# Patient Record
Sex: Male | Born: 1950
Health system: Southern US, Community
[De-identification: ages and names within clinical notes are randomized; demographics above are authoritative.]

## PROBLEM LIST (undated history)

## (undated) DIAGNOSIS — E7800 Pure hypercholesterolemia, unspecified: Secondary | ICD-10-CM

## (undated) DIAGNOSIS — E785 Hyperlipidemia, unspecified: Secondary | ICD-10-CM

## (undated) DIAGNOSIS — Z9109 Other allergy status, other than to drugs and biological substances: Secondary | ICD-10-CM

## (undated) DIAGNOSIS — E6609 Other obesity due to excess calories: Secondary | ICD-10-CM

## (undated) DIAGNOSIS — R7303 Prediabetes: Secondary | ICD-10-CM

## (undated) DIAGNOSIS — R202 Paresthesia of skin: Secondary | ICD-10-CM

## (undated) DIAGNOSIS — E782 Mixed hyperlipidemia: Secondary | ICD-10-CM

## (undated) DIAGNOSIS — M1990 Unspecified osteoarthritis, unspecified site: Secondary | ICD-10-CM

## (undated) DIAGNOSIS — I10 Essential (primary) hypertension: Secondary | ICD-10-CM

## (undated) DIAGNOSIS — Z6832 Body mass index (BMI) 32.0-32.9, adult: Secondary | ICD-10-CM

## (undated) DIAGNOSIS — R200 Anesthesia of skin: Secondary | ICD-10-CM

## (undated) HISTORY — DX: Unspecified osteoarthritis, unspecified site: M19.90

## (undated) HISTORY — DX: Hyperlipidemia, unspecified: E78.5

## (undated) HISTORY — DX: Essential (primary) hypertension: I10

## (undated) HISTORY — PX: OTHER SURGICAL HISTORY: SHX169

## (undated) HISTORY — DX: Mixed hyperlipidemia: E78.2

## (undated) HISTORY — DX: Other allergy status, other than to drugs and biological substances: Z91.09

## (undated) HISTORY — DX: Pure hypercholesterolemia, unspecified: E78.00

## (undated) HISTORY — DX: Other obesity due to excess calories: E66.09

## (undated) HISTORY — DX: Prediabetes: R73.03

## (undated) HISTORY — PX: COLONOSCOPY: SHX174

## (undated) HISTORY — DX: Paresthesia of skin: R20.2

## (undated) HISTORY — DX: Anesthesia of skin: R20.0

## (undated) HISTORY — DX: Body mass index (bmi) 32.0-32.9, adult: Z68.32

---

## 2005-05-09 ENCOUNTER — Emergency Department (HOSPITAL_COMMUNITY): Admission: EM | Admit: 2005-05-09 | Discharge: 2005-05-09 | Payer: Self-pay | Admitting: Emergency Medicine

## 2005-10-05 ENCOUNTER — Inpatient Hospital Stay (HOSPITAL_BASED_OUTPATIENT_CLINIC_OR_DEPARTMENT_OTHER): Admission: RE | Admit: 2005-10-05 | Discharge: 2005-10-05 | Payer: Self-pay | Admitting: Cardiology

## 2006-08-21 ENCOUNTER — Emergency Department (HOSPITAL_COMMUNITY): Admission: EM | Admit: 2006-08-21 | Discharge: 2006-08-21 | Payer: Self-pay | Admitting: Emergency Medicine

## 2007-08-21 ENCOUNTER — Encounter: Payer: Self-pay | Admitting: Cardiovascular Disease

## 2008-04-06 ENCOUNTER — Ambulatory Visit: Payer: Self-pay | Admitting: Internal Medicine

## 2008-04-06 LAB — CONVERTED CEMR LAB
BUN: 16 mg/dL (ref 6–23)
Creatinine, Ser: 1.2 mg/dL (ref 0.4–1.2)

## 2008-04-10 ENCOUNTER — Ambulatory Visit: Payer: Self-pay | Admitting: Internal Medicine

## 2008-04-22 ENCOUNTER — Ambulatory Visit: Payer: Self-pay | Admitting: Internal Medicine

## 2010-04-11 ENCOUNTER — Encounter: Payer: Self-pay | Admitting: Cardiovascular Disease

## 2010-12-29 ENCOUNTER — Emergency Department (HOSPITAL_COMMUNITY)
Admission: EM | Admit: 2010-12-29 | Discharge: 2010-12-29 | Payer: Self-pay | Source: Home / Self Care | Admitting: Emergency Medicine

## 2011-01-02 LAB — URINALYSIS, ROUTINE W REFLEX MICROSCOPIC
Bilirubin Urine: NEGATIVE
Hgb urine dipstick: NEGATIVE
Ketones, ur: NEGATIVE mg/dL
Nitrite: NEGATIVE
Protein, ur: NEGATIVE mg/dL
Specific Gravity, Urine: 1.014 (ref 1.005–1.030)
Urine Glucose, Fasting: NEGATIVE mg/dL
Urobilinogen, UA: 1 mg/dL (ref 0.0–1.0)
pH: 6.5 (ref 5.0–8.0)

## 2011-01-02 LAB — POCT I-STAT, CHEM 8
BUN: 13 mg/dL (ref 6–23)
Calcium, Ion: 1.14 mmol/L (ref 1.12–1.32)
Chloride: 100 mEq/L (ref 96–112)
Creatinine, Ser: 1.1 mg/dL (ref 0.4–1.5)
Glucose, Bld: 106 mg/dL — ABNORMAL HIGH (ref 70–99)
HCT: 45 % (ref 39.0–52.0)
Hemoglobin: 15.3 g/dL (ref 13.0–17.0)
Potassium: 3.2 mEq/L — ABNORMAL LOW (ref 3.5–5.1)
Sodium: 140 mEq/L (ref 135–145)
TCO2: 31 mmol/L (ref 0–100)

## 2011-03-08 ENCOUNTER — Encounter: Payer: Self-pay | Admitting: Internal Medicine

## 2011-03-16 NOTE — Letter (Signed)
Summary: Colonoscopy Letter  [facility]  [address] [place], [place] [address]   Phone: [phone]  Fax: [phone]      March 08, 2011 MRN: [id number]   [facility] [address] [place], [place]  [address]   Dear Ms. Souter,   According to your medical record, it is time for you to schedule a Colonoscopy. The American Cancer Society recommends this procedure as a method to detect early colon cancer. Patients with a family history of colon cancer, or a personal history of colon polyps or inflammatory bowel disease are at increased risk.  This letter has been generated based on the recommendations made at the time of your procedure. If you feel that in your particular situation this may no longer apply, please contact our office.  Please call our office at [phone] to schedule this appointment or to update your records at your earliest convenience.  Thank you for cooperating with [place] to provide you with the very best care possible.   Sincerely,  Wilhemina Bonito. Marina Goodell, M.D.  [facility] [phone]

## 2011-05-02 NOTE — Assessment & Plan Note (Signed)
[facility]                         GASTROENTEROLOGY OFFICE NOTE   NAME:Bryce Rodriguez, Bryce Rodriguez                       MRN:          [id number]  DATE:04/06/2008                            DOB:          04-02-1951    REFERRING PHYSICIAN:  Brett Canales A. Cleta Alberts, M.D.   REASON FOR CONSULTATION:  Rectal bleeding and right flank pain.   HISTORY:  This is a pleasant 60 year old African-American male who is  sent through the courtesy of Dr. Cleta Alberts regarding rectal bleeding and  right-sided pain.  The patient was seen in this office in March of 2002  for Hemoccult positive stool.  At this time, he underwent colonoscopy  and upper endoscopy.  Colonoscopy was normal except for internal  hemorrhoids.  The ileum was normal.  Upper endoscopy revealed antral and  duodenal erosions.  Testing for Helicobacter pylori was positive.  He  was treated with Prevpac times two weeks.  He has not been seen since.  The patient's current history dates back to November or December when he  developed problems with right flank/pain.  The discomfort occurs daily  and is worse at night when he is lying down.  There is some radiation  into the buttocks.  Discomfort is not affected by meals or other  activity.  He had a CT urogram which was negative.  He saw an orthopedic  surgeon who suggested tightness in the musculature.  He denies change in  bowel habits or urinary bladder problems.  His appetite and weight had  been stable, some gas and bloating, rare constipation though this is not  new.  Of importance, the patient noticed blood in his stool, two  episodes about one month ago.  No associated rectal pain.   PAST MEDICAL HISTORY:  1. Hypertension.  2. Hyperlipidemia.   PAST SURGICAL HISTORY:  None.   ALLERGIES:  NO KNOWN DRUG ALLERGIES   CURRENT MEDICATIONS:  1. Crestor 20 mg daily.  2. Pantoprazole 40 mg daily.  3. Benicar HCT 40/25 mg daily.  4. Aspirin 81 mg daily.   FAMILY HISTORY:   Father with gastric cancer.  Mother with breast cancer.   SOCIAL HISTORY:  The patient is married with three children.  He  completed high school.  Works as a Merchandiser, retail at [employer].  Does not smoke.  Does use alcohol.   REVIEW OF SYSTEMS:  Per diagnostic evaluation form.   PHYSICAL EXAMINATION:  GENERAL:  Well-appearing male in no acute  distress.  VITAL SIGNS:  Blood pressure is 154/98, heart rate is 68 and regular,  respirations are 18 and regular, weight 197.4 pounds.  He is 5 feet, 5  inches in height.  HEENT:  Sclerae anicteric, conjunctivae pink.  Oral mucosa is intact.  NECK:  Thyroid is normal.  No adenopathy.  LUNGS:  Clear to auscultation and percussion.  HEART:  Regular without murmur.  ABDOMEN:  Soft, nontender, nondistended with good bowel sounds, no  organomegaly, masses or hernia.  SPINE:  Reveals no tenderness to percussion.  MUSCULOSKELETAL:  As well, no tenderness to percussion over the flank  regions.  Range  of motion appears normal in the lower extremities.  NEUROLOGIC:  No focal deficits appreciated.  EXTREMITIES:  Without edema.  RECTAL EXAM:  Deferred.   IMPRESSION:  1. Chronic right flank/hip pain.  Likely musculoskeletal.  Unlikely      gastrointestinal.  2. Two episodes of rectal bleeding possibly due to hemorrhoids, rule      out neoplasia.  3. History of erosive gastroduodenitis with Helicobacter pylori.      Prior treatment with Prevpac.No active dyspeptic symptoms.   RECOMMENDATIONS:  1. Schedule colonoscopy to evaluate rectal bleeding.  The nature of      the procedure as well as the risks, benefits and alternatives have      been reviewed.  He understood and agreed to proceed.  2. Schedule contrast-enhanced CT scan of the abdomen and pelvis to      further evaluate pain.  If this is negative, then return to the      care of Dr. Cleta Alberts regarding further evaluation and management of      pain.     Wilhemina Bonito. Marina Goodell, MD  Electronically  Signed    JNP/MedQ  DD: 04/06/2008  DT: 04/06/2008  Job #: [id number]   cc:   Brett Canales A. Cleta Alberts, M.D.

## 2011-05-05 NOTE — Cardiovascular Report (Signed)
NAMEBOWDEN, Bryce Rodriguez                ACCOUNT NO.:  [id number]   MEDICAL RECORD NO.:  [id number]          PATIENT TYPE:  OIB   LOCATION:  1961                         FACILITY:  [facility]   PHYSICIAN:  Georga Hacking, M.D.DATE OF BIRTH:  [birth year]   DATE OF PROCEDURE:  10/05/2005  DATE OF DISCHARGE:                              CARDIAC CATHETERIZATION   HISTORY:  [age]-year-old black male with hypertension and some chest  discomfort with atypical features.  A Cardiolite stress test showed the  presence of possible lateral wall ischemia.   PROCEDURE:  Left heart catheterization with coronary angiograms and left  ventriculogram.   COMMENTS ABOUT PROCEDURE:  The procedure was done with 4-French catheters  using a single anterior wall needle stick through a 4-French sheath.  The  patient tolerated the procedure well without complications and had good  hemostasis and peripheral pulses noted at the end of the procedure.   HEMODYNAMIC DATA:  Aorta post contrast 148/87.  LV postcontrast 148/18-26.   ANGIOGRAPHIC DATA:   LEFT VENTRICULOGRAM:  Performed in the 30-degree RAO projection.  The aortic  valve is normal.  The mitral valve appears normal.  The left ventricle  appears normal in size.  Increased trabeculations consistent with LVH are  noted.  Estimated ejection fraction is 60%.  Wall motion is normal.   Coronary arteries:  Arise and distribute normally.  No significant coronary  calcification is present.  Left main coronary artery normal.   Left anterior descending:  Large vessel extending to the apex, which is free  of disease.  There are scattered irregularities present.   Circumflex coronary artery:  There are 3 marginal arteries noted; they  contains scattered irregularities.  The third marginal artery is somewhat  small in caliber.   Right coronary artery:  A large dominant vessel containing scattered  irregularities.  The posterior descending and posterolateral branch  are  noted.   IMPRESSION:  1.  Scattered irregularities, but no significant coronary artery disease      noted.  2.  Normal left ventricular function with left ventricular hypertrophy and      increased left ventricular end-diastolic pressure.   RECOMMENDATIONS:  Weight loss, good control of blood pressure, control of  lipids.      Georga Hacking, M.D.  Electronically Signed     WST/MEDQ  D:  10/05/2005  T:  10/05/2005  Job:  [id number]   cc:   Brett Canales A. Cleta Alberts, M.D.  Fax: [phone]

## 2011-11-22 ENCOUNTER — Ambulatory Visit: Payer: Managed Care, Other (non HMO)

## 2011-11-22 DIAGNOSIS — R071 Chest pain on breathing: Secondary | ICD-10-CM

## 2011-11-22 DIAGNOSIS — J069 Acute upper respiratory infection, unspecified: Secondary | ICD-10-CM

## 2011-11-22 DIAGNOSIS — J209 Acute bronchitis, unspecified: Secondary | ICD-10-CM

## 2012-01-23 ENCOUNTER — Encounter: Payer: Self-pay | Admitting: Physician Assistant

## 2012-01-23 ENCOUNTER — Ambulatory Visit (INDEPENDENT_AMBULATORY_CARE_PROVIDER_SITE_OTHER): Payer: Managed Care, Other (non HMO) | Admitting: Physician Assistant

## 2012-01-23 DIAGNOSIS — E7800 Pure hypercholesterolemia, unspecified: Secondary | ICD-10-CM | POA: Insufficient documentation

## 2012-01-23 DIAGNOSIS — J069 Acute upper respiratory infection, unspecified: Secondary | ICD-10-CM

## 2012-01-23 DIAGNOSIS — H6092 Unspecified otitis externa, left ear: Secondary | ICD-10-CM

## 2012-01-23 DIAGNOSIS — I10 Essential (primary) hypertension: Secondary | ICD-10-CM | POA: Insufficient documentation

## 2012-01-23 DIAGNOSIS — H60399 Other infective otitis externa, unspecified ear: Secondary | ICD-10-CM

## 2012-01-23 HISTORY — DX: Pure hypercholesterolemia, unspecified: E78.00

## 2012-01-23 MED ORDER — OFLOXACIN 0.3 % OT SOLN: 10.0000 [drp] | Freq: Every day | OTIC | Status: AC

## 2012-01-23 MED ORDER — GUAIFENESIN ER 1200 MG PO TB12: 1.0000 | ORAL_TABLET | Freq: Two times a day (BID) | ORAL | Status: DC

## 2012-01-23 NOTE — Progress Notes (Signed)
  Subjective:    Patient ID: Bryce Rodriguez, male    DOB: Mar 27, 1951, 61 y.o.   MRN: [id number]  Otalgia  There is pain in the left ear. This is a new problem. The current episode started today. The problem occurs constantly. The problem has been unchanged. There has been no fever. Associated symptoms include ear discharge (? Color) and hearing loss (muffled). Pertinent negatives include no coughing, diarrhea, headaches, rhinorrhea, sore throat or vomiting. He has tried nothing for the symptoms.  URI  This is a new problem. The current episode started yesterday. The problem has been unchanged. There has been no fever. Associated symptoms include congestion (alot of sinus pressure), ear pain (L only) and a plugged ear sensation (muffled hearing). Pertinent negatives include no coughing, diarrhea, headaches, nausea, rhinorrhea, sore throat or vomiting. He has tried nothing for the symptoms.      Review of Systems  Constitutional: Negative for fever and chills.  HENT: Positive for hearing loss (muffled), ear pain (L only), congestion (alot of sinus pressure) and ear discharge (? Color). Negative for sore throat, rhinorrhea and postnasal drip.   Respiratory: Negative for cough.   Gastrointestinal: Negative for nausea, vomiting and diarrhea.  Neurological: Negative for headaches.       Objective:   Physical Exam  Constitutional: He is oriented to person, place, and time. He appears well-developed and well-nourished.  HENT:  Head: Normocephalic and atraumatic.  Right Ear: Tympanic membrane and external ear normal.  Left Ear: There is drainage (white d/c, ear canal is red and mildly swollen). Tympanic membrane is erythematous.  Cardiovascular: Normal rate and regular rhythm.   No murmur heard. Pulmonary/Chest: Effort normal and breath sounds normal.  Neurological: He is alert and oriented to person, place, and time.  Skin: Skin is warm and dry.  Psychiatric: He has a normal mood and affect.  His behavior is normal. Judgment and thought content normal.          Assessment & Plan:

## 2012-01-25 ENCOUNTER — Telehealth: Payer: Self-pay

## 2012-01-25 NOTE — Telephone Encounter (Signed)
.  [facility] PT WOULD LIKE TO HAVE A Z-PAK CALLED IN SINCE HIS THROAT IS STILL BOTHERING HIM PLEASE CALL [phone] AND HE USES [facility] [place]

## 2012-01-26 NOTE — Telephone Encounter (Signed)
Please get additional details about his current symptoms.  He may need to return for re-evaluation.

## 2012-01-26 NOTE — Telephone Encounter (Signed)
Chart pulled °

## 2012-01-26 NOTE — Telephone Encounter (Signed)
LMOM to CB with details. 

## 2012-01-26 NOTE — Telephone Encounter (Signed)
Patient called back.  States that he is still having a lot of nasal congestion, ST, and watery eyes.  Drainage is very productive.  Ears are feeling better.  Would like abx to clear up mucus.  Please advise.

## 2012-01-27 NOTE — Telephone Encounter (Signed)
Please advise pt he needs to return to clinic Benny Lennert consulted) if symptoms are not resolving.  He may need a few more days for his body to clear this infection.

## 2012-01-27 NOTE — Telephone Encounter (Signed)
Left a message with son Onalee Hua, for patient to call back

## 2012-01-28 NOTE — Telephone Encounter (Signed)
PT THINKS HE MAY BE GETTING BETTER BUT WILL RETURN IF HE DOES NOT.

## 2012-01-28 NOTE — Telephone Encounter (Signed)
LM WITH LADY TO RETURN CALL.

## 2012-02-24 ENCOUNTER — Other Ambulatory Visit: Payer: Self-pay | Admitting: Emergency Medicine

## 2012-02-24 ENCOUNTER — Other Ambulatory Visit: Payer: Self-pay | Admitting: Internal Medicine

## 2012-02-24 MED ORDER — AMLODIPINE BESYLATE 10 MG PO TABS: 10.0000 mg | ORAL_TABLET | Freq: Every day | ORAL | Status: DC

## 2012-02-26 ENCOUNTER — Other Ambulatory Visit: Payer: Self-pay | Admitting: *Deleted

## 2012-02-26 MED ORDER — LOSARTAN POTASSIUM-HCTZ 100-25 MG PO TABS: 1.0000 | ORAL_TABLET | Freq: Every day | ORAL | Status: DC

## 2012-04-04 ENCOUNTER — Encounter: Payer: Self-pay | Admitting: *Deleted

## 2012-04-08 ENCOUNTER — Other Ambulatory Visit: Payer: Self-pay | Admitting: Physician Assistant

## 2012-04-16 ENCOUNTER — Ambulatory Visit (INDEPENDENT_AMBULATORY_CARE_PROVIDER_SITE_OTHER): Payer: Managed Care, Other (non HMO) | Admitting: Emergency Medicine

## 2012-04-16 VITALS — BP 120/79 | HR 83 | Temp 98.2°F | Resp 16 | Ht 66.5 in | Wt 200.8 lb

## 2012-04-16 DIAGNOSIS — E785 Hyperlipidemia, unspecified: Secondary | ICD-10-CM

## 2012-04-16 DIAGNOSIS — Z0000 Encounter for general adult medical examination without abnormal findings: Secondary | ICD-10-CM

## 2012-04-16 DIAGNOSIS — I10 Essential (primary) hypertension: Secondary | ICD-10-CM

## 2012-04-16 DIAGNOSIS — E782 Mixed hyperlipidemia: Secondary | ICD-10-CM

## 2012-04-16 LAB — COMPREHENSIVE METABOLIC PANEL
ALT: 37 U/L (ref 0–53)
AST: 24 U/L (ref 0–37)
Albumin: 4.7 g/dL (ref 3.5–5.2)
Alkaline Phosphatase: 48 U/L (ref 39–117)
BUN: 25 mg/dL — ABNORMAL HIGH (ref 6–23)
CO2: 28 mEq/L (ref 19–32)
Calcium: 9.6 mg/dL (ref 8.4–10.5)
Chloride: 101 mEq/L (ref 96–112)
Creat: 1.26 mg/dL (ref 0.50–1.35)
Glucose, Bld: 99 mg/dL (ref 70–99)
Potassium: 3.4 mEq/L — ABNORMAL LOW (ref 3.5–5.3)
Sodium: 141 mEq/L (ref 135–145)
Total Bilirubin: 0.4 mg/dL (ref 0.3–1.2)
Total Protein: 7.1 g/dL (ref 6.0–8.3)

## 2012-04-16 LAB — PSA: PSA: 1.78 ng/mL (ref <=4.00)

## 2012-04-16 LAB — CBC WITH DIFFERENTIAL/PLATELET
Basophils Absolute: 0 10*3/uL (ref 0.0–0.1)
Basophils Relative: 0 % (ref 0–1)
Eosinophils Absolute: 0 10*3/uL (ref 0.0–0.7)
Eosinophils Relative: 1 % (ref 0–5)
HCT: 41 % (ref 39.0–52.0)
Hemoglobin: 13.6 g/dL (ref 13.0–17.0)
Lymphocytes Relative: 32 % (ref 12–46)
Lymphs Abs: 1.9 10*3/uL (ref 0.7–4.0)
MCH: 28.6 pg (ref 26.0–34.0)
MCHC: 33.2 g/dL (ref 30.0–36.0)
MCV: 86.1 fL (ref 78.0–100.0)
Monocytes Absolute: 0.5 10*3/uL (ref 0.1–1.0)
Monocytes Relative: 8 % (ref 3–12)
Neutro Abs: 3.4 10*3/uL (ref 1.7–7.7)
Neutrophils Relative %: 59 % (ref 43–77)
Platelets: 264 10*3/uL (ref 150–400)
RBC: 4.76 MIL/uL (ref 4.22–5.81)
RDW: 14.3 % (ref 11.5–15.5)
WBC: 5.8 10*3/uL (ref 4.0–10.5)

## 2012-04-16 LAB — POCT URINALYSIS DIPSTICK
Bilirubin, UA: NEGATIVE
Glucose, UA: NEGATIVE
Ketones, UA: NEGATIVE
Leukocytes, UA: NEGATIVE
Nitrite, UA: NEGATIVE
Protein, UA: NEGATIVE
Spec Grav, UA: >=1.03
Urobilinogen, UA: 0.2
pH, UA: 5.5

## 2012-04-16 LAB — LIPID PANEL
Cholesterol: 245 mg/dL — ABNORMAL HIGH (ref 0–200)
HDL: 42 mg/dL (ref >39)
LDL Cholesterol: 174 mg/dL — ABNORMAL HIGH (ref 0–99)
Total CHOL/HDL Ratio: 5.8 Ratio
Triglycerides: 147 mg/dL (ref <150)
VLDL: 29 mg/dL (ref 0–40)

## 2012-04-16 LAB — POCT UA - MICROSCOPIC ONLY
Bacteria, U Microscopic: NEGATIVE
Casts, Ur, LPF, POC: NEGATIVE
Crystals, Ur, HPF, POC: NEGATIVE
Mucus, UA: NEGATIVE
Yeast, UA: NEGATIVE

## 2012-04-16 LAB — IFOBT (OCCULT BLOOD): IFOBT: NEGATIVE

## 2012-04-16 MED ORDER — LOSARTAN POTASSIUM-HCTZ 100-25 MG PO TABS: 1.0000 | ORAL_TABLET | Freq: Every day | ORAL | Status: DC

## 2012-04-16 MED ORDER — AMLODIPINE BESYLATE 10 MG PO TABS: 10.0000 mg | ORAL_TABLET | Freq: Every day | ORAL | Status: DC

## 2012-04-16 MED ORDER — ROSUVASTATIN CALCIUM 20 MG PO TABS: 20.0000 mg | ORAL_TABLET | Freq: Every day | ORAL | Status: DC

## 2012-04-16 NOTE — Progress Notes (Signed)
  Subjective:    Patient ID: Bryce Rodriguez, male    DOB: 1951/11/26, 61 y.o.   MRN: [id number]  HPI patient enters for his yearly physical exam . He has been doing relatively well with no major issues appearing. He is followed for high blood pressure and high cholesterol.    Review of Systems his only major complaint is of pain and swelling in his hands and wrists. He has a history of carpal tunnel syndrome and has had injections to these areas before. This was years ago he saw Dr. Amanda Pea at that time. He continues to have pain in his back he has seen Dr. Roylene Reason ski who did not recommend surgery to     Objective:   Physical Exam  Constitutional: He appears well-nourished.  HENT:  Head: Normocephalic and atraumatic.  Right Ear: External ear normal.  Left Ear: External ear normal.  Neck: Normal range of motion. Neck supple. No tracheal deviation present. No thyromegaly present.  Cardiovascular: Normal rate, regular rhythm and normal heart sounds.  Exam reveals no gallop.   No murmur heard. Pulmonary/Chest: No respiratory distress. He has no wheezes. He has no rales. He exhibits no tenderness.  Abdominal: Bowel sounds are normal. He exhibits no distension and no mass. There is no tenderness. There is no rebound and no guarding.  Genitourinary: Prostate normal. Guaiac negative stool.  Musculoskeletal:       He has some tenderness over the carpal tunnel area and also some tenderness over his lower back  Lymphadenopathy:    He has no cervical adenopathy.  Neurological: He is alert. He has normal reflexes. Coordination normal.  Skin: Skin is warm and dry.  Psychiatric: He has a normal mood and affect. His behavior is normal. Thought content normal.   Results for orders placed in visit on 04/16/12  POCT UA - MICROSCOPIC ONLY      Component Value Range   WBC, Ur, HPF, POC 0-1     RBC, urine, microscopic 0-3     Bacteria, U Microscopic neg     Mucus, UA neg     Epithelial cells, urine per  micros 0-2     Crystals, Ur, HPF, POC neg     Casts, Ur, LPF, POC neg     Yeast, UA neg    POCT URINALYSIS DIPSTICK      Component Value Range   Color, UA yellow     Clarity, UA clear     Glucose, UA neg     Bilirubin, UA neg     Ketones, UA neg     Spec Grav, UA >=1.030     Blood, UA trace     pH, UA 5.5     Protein, UA neg     Urobilinogen, UA 0.2     Nitrite, UA neg     Leukocytes, UA Negative    IFOBT (OCCULT BLOOD)      Component Value Range   IFOBT Negative      EKG normal      Assessment & Plan:      Patient stable at present. Will refill medications. I told him to make an appointment to see Dr. Yevette Edwards to followup on his back pain. I've asked him if he wanted me to make him an appointment to see Dr. Amanda Pea and he wants to hold off on this visit at the present time.

## 2012-04-26 ENCOUNTER — Other Ambulatory Visit: Payer: Self-pay | Admitting: Physician Assistant

## 2012-06-02 ENCOUNTER — Other Ambulatory Visit: Payer: Self-pay | Admitting: Internal Medicine

## 2012-06-30 ENCOUNTER — Other Ambulatory Visit: Payer: Self-pay | Admitting: Internal Medicine

## 2012-06-30 ENCOUNTER — Other Ambulatory Visit: Payer: Self-pay | Admitting: Emergency Medicine

## 2012-07-04 ENCOUNTER — Other Ambulatory Visit: Payer: Self-pay | Admitting: Internal Medicine

## 2012-09-12 ENCOUNTER — Encounter: Payer: Self-pay | Admitting: Internal Medicine

## 2012-09-29 ENCOUNTER — Ambulatory Visit: Payer: Managed Care, Other (non HMO) | Admitting: Physician Assistant

## 2012-09-29 VITALS — BP 150/91 | HR 61 | Temp 98.2°F | Resp 16 | Ht 65.5 in | Wt 198.0 lb

## 2012-09-29 DIAGNOSIS — J069 Acute upper respiratory infection, unspecified: Secondary | ICD-10-CM

## 2012-09-29 DIAGNOSIS — J029 Acute pharyngitis, unspecified: Secondary | ICD-10-CM

## 2012-09-29 DIAGNOSIS — R059 Cough, unspecified: Secondary | ICD-10-CM

## 2012-09-29 DIAGNOSIS — R05 Cough: Secondary | ICD-10-CM

## 2012-09-29 MED ORDER — HYDROCOD POLST-CHLORPHEN POLST 10-8 MG/5ML PO LQCR: 5.0000 mL | Freq: Two times a day (BID) | ORAL | Status: DC

## 2012-09-29 MED ORDER — MAGIC MOUTHWASH W/LIDOCAINE: ORAL | Status: DC

## 2012-09-29 MED ORDER — FLUTICASONE PROPIONATE 50 MCG/ACT NA SUSP: 2.0000 | Freq: Every day | NASAL | Status: DC

## 2012-09-29 NOTE — Progress Notes (Signed)
   [address], [place] [address]   Phone [phone]  Subjective:    Patient ID: Bryce Rodriguez, male    DOB: 1951-07-11, 61 y.o.   MRN: [id number]  HPI Pt presents to clinic with 4 days of cold symptoms.  It started with congestion and cough which has continued to get worse.  Pt is not sleeping because of cough.  Green sputum and green rhinorrhea with a lot of PND.  He has been using OTC nasal saline and mucinex but does not feel like that is helping.  He has a significant ST that is worse after coughing.  He has no h/o lung disease and has had no exposures to strep throat. H/o seasonal allergies but does not feel like that is what this is.  Review of Systems  Constitutional: Positive for fever (subj). Negative for chills.  HENT: Positive for congestion, sore throat, rhinorrhea and postnasal drip. Negative for sinus pressure.   Eyes:       Eye lids are swollen  Respiratory: Positive for cough. Negative for shortness of breath and wheezing.   Gastrointestinal: Negative for diarrhea.  Neurological: Negative for headaches.  Psychiatric/Behavioral: Positive for disturbed wake/sleep cycle (from cough).       Objective:   Physical Exam  Vitals reviewed. Constitutional: He is oriented to person, place, and time. He appears well-developed and well-nourished.  HENT:  Head: Normocephalic and atraumatic.  Right Ear: Hearing, tympanic membrane, external ear and ear canal normal.  Left Ear: Hearing, tympanic membrane, external ear and ear canal normal.  Nose: Mucosal edema present.  Mouth/Throat: Uvula is midline.  Eyes: Conjunctivae normal are normal.  Neck: Neck supple.  Cardiovascular: Normal rate, regular rhythm and normal heart sounds.   No murmur heard. Pulmonary/Chest: Effort normal and breath sounds normal. He has no wheezes.  Neurological: He is alert and oriented to person, place, and time.  Skin: Skin is warm and dry.  Psychiatric: He has a normal mood and affect. His  behavior is normal. Judgment and thought content normal.       Assessment & Plan:   1. Cough  chlorpheniramine-HYDROcodone (TUSSIONEX PENNKINETIC ER) 10-8 MG/5ML LQCR  2. URI (upper respiratory infection)  fluticasone (FLONASE) 50 MCG/ACT nasal spray  3. Acute pharyngitis  Alum & Mag Hydroxide-Simeth (MAGIC MOUTHWASH W/LIDOCAINE) SOLN   Pt to push fluids, tylenol for myalgias and fever because he is taking mobic daily.  Take above meds and if not significantly better in 1 wk let me know because if still swollen around the eyes would be concerned about sinus infection.

## 2012-10-15 ENCOUNTER — Encounter: Payer: Self-pay | Admitting: Emergency Medicine

## 2012-10-15 ENCOUNTER — Ambulatory Visit (INDEPENDENT_AMBULATORY_CARE_PROVIDER_SITE_OTHER): Payer: Managed Care, Other (non HMO) | Admitting: Emergency Medicine

## 2012-10-15 VITALS — BP 144/90 | HR 62 | Temp 98.0°F | Resp 16 | Ht 65.5 in | Wt 196.8 lb

## 2012-10-15 DIAGNOSIS — R05 Cough: Secondary | ICD-10-CM

## 2012-10-15 DIAGNOSIS — R059 Cough, unspecified: Secondary | ICD-10-CM

## 2012-10-15 DIAGNOSIS — E782 Mixed hyperlipidemia: Secondary | ICD-10-CM

## 2012-10-15 DIAGNOSIS — I10 Essential (primary) hypertension: Secondary | ICD-10-CM

## 2012-10-15 LAB — COMPREHENSIVE METABOLIC PANEL WITH GFR
ALT: 36 U/L (ref 0–53)
AST: 23 U/L (ref 0–37)
Albumin: 4.3 g/dL (ref 3.5–5.2)
Alkaline Phosphatase: 48 U/L (ref 39–117)
BUN: 14 mg/dL (ref 6–23)
CO2: 28 meq/L (ref 19–32)
Calcium: 9.9 mg/dL (ref 8.4–10.5)
Chloride: 102 meq/L (ref 96–112)
Creat: 0.92 mg/dL (ref 0.50–1.35)
Glucose, Bld: 99 mg/dL (ref 70–99)
Potassium: 3.2 meq/L — ABNORMAL LOW (ref 3.5–5.3)
Sodium: 138 meq/L (ref 135–145)
Total Bilirubin: 0.5 mg/dL (ref 0.3–1.2)
Total Protein: 6.7 g/dL (ref 6.0–8.3)

## 2012-10-15 LAB — LIPID PANEL
Cholesterol: 232 mg/dL — ABNORMAL HIGH (ref 0–200)
HDL: 40 mg/dL
LDL Cholesterol: 158 mg/dL — ABNORMAL HIGH (ref 0–99)
Total CHOL/HDL Ratio: 5.8 ratio
Triglycerides: 169 mg/dL — ABNORMAL HIGH
VLDL: 34 mg/dL (ref 0–40)

## 2012-10-15 LAB — TSH: TSH: 1.356 u[IU]/mL (ref 0.350–4.500)

## 2012-10-15 MED ORDER — BENZONATATE 100 MG PO CAPS: 100.0000 mg | ORAL_CAPSULE | Freq: Three times a day (TID) | ORAL | Status: DC | PRN

## 2012-10-15 NOTE — Progress Notes (Signed)
  Subjective:    Patient ID: Bryce Rodriguez, male    DOB: 08/19/1951, 61 y.o.   MRN: [id number]  HPI patient states he is doing well except for a persistent cough. He continues to have a postnasal drip. He is on Flonase for allergic symptoms. He continues on his Crestor and blood pressure medications on a regular basis .     Review of Systems     Objective:   Physical Exam HEENT exam is unremarkable. The posterior pharynx is clear. His chest was clear his cardiac exam reveals a regular rate no murmurs. Repeat blood pressure was done and was 130/80        Assessment & Plan:     Patient is stable at present. He does have a nagging persistent cough. I advised him to try either Zyrtec 10 mg or Claritin 10 mg to see if this would help. I also sent in a prescription for Tessalon Perles. No change in his cholesterol or blood pressure medications at present. labs were done. He is having more difficulty with symptoms of carpal tunnel he is seen in [place] and Dr. Brunetta Genera makes office in the past and is going to schedule an appointment to see them.

## 2012-10-18 ENCOUNTER — Telehealth: Payer: Self-pay

## 2012-10-18 ENCOUNTER — Other Ambulatory Visit: Payer: Self-pay

## 2012-10-18 MED ORDER — LOSARTAN POTASSIUM-HCTZ 100-25 MG PO TABS: 1.0000 | ORAL_TABLET | Freq: Every day | ORAL | Status: DC

## 2012-10-18 MED ORDER — MELOXICAM 15 MG PO TABS: 15.0000 mg | ORAL_TABLET | Freq: Every day | ORAL | Status: DC

## 2012-10-18 MED ORDER — POTASSIUM CHLORIDE CRYS ER 20 MEQ PO TBCR: 20.0000 meq | EXTENDED_RELEASE_TABLET | Freq: Every day | ORAL | Status: DC

## 2012-10-18 MED ORDER — AMLODIPINE BESYLATE 10 MG PO TABS: 10.0000 mg | ORAL_TABLET | Freq: Every day | ORAL | Status: DC

## 2012-10-18 MED ORDER — ROSUVASTATIN CALCIUM 20 MG PO TABS: 20.0000 mg | ORAL_TABLET | Freq: Every day | ORAL | Status: DC

## 2012-10-18 NOTE — Telephone Encounter (Signed)
Rx meloxicam 15 mg, #90, No RF

## 2012-10-18 NOTE — Telephone Encounter (Signed)
Left message for him to call me back, so I can determine exactly which meds needed.

## 2012-10-18 NOTE — Telephone Encounter (Signed)
[facility] and Ch[facility]., he also requests 90 day rx for his meloxicam please advise

## 2012-10-18 NOTE — Telephone Encounter (Signed)
Patient needs 90 days Rx on [facility]

## 2012-10-19 MED ORDER — MELOXICAM 15 MG PO TABS: 15.0000 mg | ORAL_TABLET | Freq: Every day | ORAL | Status: DC

## 2012-10-19 NOTE — Telephone Encounter (Signed)
[facility]. patient notified an voiced understanding.

## 2013-02-11 ENCOUNTER — Encounter: Payer: Managed Care, Other (non HMO) | Admitting: Emergency Medicine

## 2013-03-25 ENCOUNTER — Other Ambulatory Visit: Payer: Self-pay | Admitting: Emergency Medicine

## 2013-03-25 ENCOUNTER — Ambulatory Visit (INDEPENDENT_AMBULATORY_CARE_PROVIDER_SITE_OTHER): Payer: Managed Care, Other (non HMO) | Admitting: Emergency Medicine

## 2013-03-25 ENCOUNTER — Encounter: Payer: Self-pay | Admitting: Emergency Medicine

## 2013-03-25 ENCOUNTER — Telehealth: Payer: Self-pay

## 2013-03-25 VITALS — BP 129/84 | HR 70 | Temp 98.1°F | Resp 18 | Ht 66.0 in | Wt 197.0 lb

## 2013-03-25 DIAGNOSIS — Z0000 Encounter for general adult medical examination without abnormal findings: Secondary | ICD-10-CM

## 2013-03-25 DIAGNOSIS — I10 Essential (primary) hypertension: Secondary | ICD-10-CM

## 2013-03-25 DIAGNOSIS — J069 Acute upper respiratory infection, unspecified: Secondary | ICD-10-CM

## 2013-03-25 DIAGNOSIS — J301 Allergic rhinitis due to pollen: Secondary | ICD-10-CM

## 2013-03-25 LAB — CBC
HCT: 40.8 % (ref 39.0–52.0)
Hemoglobin: 13.7 g/dL (ref 13.0–17.0)
MCH: 28.4 pg (ref 26.0–34.0)
MCHC: 33.6 g/dL (ref 30.0–36.0)
MCV: 84.5 fL (ref 78.0–100.0)
Platelets: 273 10*3/uL (ref 150–400)
RBC: 4.83 MIL/uL (ref 4.22–5.81)
RDW: 13.7 % (ref 11.5–15.5)
WBC: 5.6 10*3/uL (ref 4.0–10.5)

## 2013-03-25 LAB — COMPREHENSIVE METABOLIC PANEL
Albumin: 4.6 g/dL (ref 3.5–5.2)
BUN: 13 mg/dL (ref 6–23)
CO2: 26 mEq/L (ref 19–32)
Calcium: 9.3 mg/dL (ref 8.4–10.5)
Chloride: 104 mEq/L (ref 96–112)
Glucose, Bld: 95 mg/dL (ref 70–99)
Potassium: 3.5 mEq/L (ref 3.5–5.3)
Total Protein: 7 g/dL (ref 6.0–8.3)

## 2013-03-25 LAB — POCT URINALYSIS DIPSTICK
Bilirubin, UA: NEGATIVE
Blood, UA: NEGATIVE
Glucose, UA: NEGATIVE
Nitrite, UA: NEGATIVE
Spec Grav, UA: 1.025

## 2013-03-25 LAB — LIPID PANEL
HDL: 42 mg/dL (ref >39)
Triglycerides: 98 mg/dL (ref <150)

## 2013-03-25 LAB — IFOBT (OCCULT BLOOD): IFOBT: NEGATIVE

## 2013-03-25 MED ORDER — LOSARTAN POTASSIUM-HCTZ 100-25 MG PO TABS: 1.0000 | ORAL_TABLET | Freq: Every day | ORAL | Status: DC

## 2013-03-25 MED ORDER — FLUTICASONE PROPIONATE 50 MCG/ACT NA SUSP: 2.0000 | Freq: Every day | NASAL | Status: DC

## 2013-03-25 MED ORDER — ROSUVASTATIN CALCIUM 20 MG PO TABS: 20.0000 mg | ORAL_TABLET | Freq: Every day | ORAL | Status: DC

## 2013-03-25 MED ORDER — AMLODIPINE BESYLATE 10 MG PO TABS: 10.0000 mg | ORAL_TABLET | Freq: Every day | ORAL | Status: DC

## 2013-03-25 MED ORDER — POTASSIUM CHLORIDE CRYS ER 20 MEQ PO TBCR: 20.0000 meq | EXTENDED_RELEASE_TABLET | Freq: Every day | ORAL | Status: DC

## 2013-03-25 NOTE — Progress Notes (Signed)
  Subjective:    Patient ID: Bryce Rodriguez, male    DOB: 04/10/1951, 62 y.o.   MRN: [id number]  HPI Here for annual visit. Reports he has been doing well. Says his BP has been stable. He checks it regularly, with it normally 130s/high 70s to low 80s. Reports he has had lower back pain that radiates to legs in the mornings. Was seen by [facility], was put on pain pills, which he does not like taking. Is setting up physical therapy and going to buy a new mattress. Told he has some arthritis in back. Has had colonoscopies, not due for another yet.   Does do smokeless tobacco. Dips roughly a can a day for 15-16 years.     Review of Systems  Constitutional: Negative.   Respiratory: Negative.   Cardiovascular: Negative.   Musculoskeletal: Positive for back pain and gait problem (when back hurts in mornings).  Neurological: Negative.        Objective:   Physical Exam  Constitutional: He is oriented to person, place, and time. He appears well-developed and well-nourished.  HENT:  Head: Normocephalic and atraumatic.  Mouth/Throat: Oropharynx is clear and moist.  Eyes: Conjunctivae and EOM are normal. Pupils are equal, round, and reactive to light.  Mild arcus senilis  Neck: Normal range of motion. Neck supple.  Cardiovascular: Normal rate, regular rhythm and normal heart sounds.   Pulmonary/Chest: Effort normal and breath sounds normal.  Abdominal: Soft. Bowel sounds are normal.  Musculoskeletal:       Lumbar back: He exhibits decreased range of motion (pain with forward flexion, radiating down both legs) and tenderness (mild). He exhibits no swelling and no edema.  Neurological: He is alert and oriented to person, place, and time.  Skin: Skin is warm and dry.   GU exam reveals no palpable hernia. Rectal exam reveals a normal-sized prostate no nodules are felt stool was sent for hemosure        Assessment & Plan:  Medical problems are stable at the present time. His  biggest problem has been his back and he is going to go to physical therapy for this. 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 I did give him a prescription for shingles vaccine. All meds were refilled for one year. Patient also uses dip and he was encouraged and counseled to quit this.

## 2013-03-25 NOTE — Telephone Encounter (Signed)
Per Dr Cleta Alberts: Rx's for amlodipine 10 mg, flunose 50 mcg, losartan-hydrochlorothiazide 100/25 mg, potassium chloride 20 meq, and rosuvastatin 20mg  [facility]56[facility].

## 2013-03-26 LAB — HEPATITIS C ANTIBODY: HCV Ab: NEGATIVE

## 2013-08-13 ENCOUNTER — Telehealth: Payer: Self-pay

## 2013-08-13 MED ORDER — POTASSIUM CHLORIDE CRYS ER 20 MEQ PO TBCR: 20.0000 meq | EXTENDED_RELEASE_TABLET | Freq: Every day | ORAL | Status: DC

## 2013-08-13 MED ORDER — LOSARTAN POTASSIUM-HCTZ 100-25 MG PO TABS: 1.0000 | ORAL_TABLET | Freq: Every day | ORAL | Status: DC

## 2013-08-13 MED ORDER — AMLODIPINE BESYLATE 10 MG PO TABS: 10.0000 mg | ORAL_TABLET | Freq: Every day | ORAL | Status: DC

## 2013-08-13 MED ORDER — ROSUVASTATIN CALCIUM 20 MG PO TABS: 20.0000 mg | ORAL_TABLET | Freq: Every day | ORAL | Status: DC

## 2013-08-13 NOTE — Telephone Encounter (Signed)
PT HAS MEDE AN APPT. W/ DR. DAUB ON September 15TH BUT NEEDS A REFILL OF HIS CRESTOR AND OTHER BLOOD PRESSURE MEDICATION TO LAST UNTIL THEN. [facility] W[facility] [address]  PT'S NUMBER [phone]

## 2013-08-13 NOTE — Telephone Encounter (Signed)
Sent meds to pharmacy  

## 2013-08-13 NOTE — Telephone Encounter (Signed)
They printed. I called.

## 2013-09-01 ENCOUNTER — Telehealth: Payer: Self-pay | Admitting: Family Medicine

## 2013-09-01 ENCOUNTER — Ambulatory Visit (INDEPENDENT_AMBULATORY_CARE_PROVIDER_SITE_OTHER): Payer: Managed Care, Other (non HMO) | Admitting: Emergency Medicine

## 2013-09-01 ENCOUNTER — Encounter: Payer: Self-pay | Admitting: Emergency Medicine

## 2013-09-01 ENCOUNTER — Ambulatory Visit: Payer: Managed Care, Other (non HMO)

## 2013-09-01 VITALS — BP 150/86 | HR 73 | Temp 99.0°F | Resp 16 | Ht 66.5 in | Wt 193.6 lb

## 2013-09-01 DIAGNOSIS — M25519 Pain in unspecified shoulder: Secondary | ICD-10-CM

## 2013-09-01 DIAGNOSIS — E785 Hyperlipidemia, unspecified: Secondary | ICD-10-CM

## 2013-09-01 DIAGNOSIS — J069 Acute upper respiratory infection, unspecified: Secondary | ICD-10-CM

## 2013-09-01 DIAGNOSIS — M25511 Pain in right shoulder: Secondary | ICD-10-CM

## 2013-09-01 DIAGNOSIS — I10 Essential (primary) hypertension: Secondary | ICD-10-CM

## 2013-09-01 LAB — COMPREHENSIVE METABOLIC PANEL
Albumin: 5 g/dL (ref 3.5–5.2)
Alkaline Phosphatase: 48 U/L (ref 39–117)
BUN: 13 mg/dL (ref 6–23)
CO2: 28 mEq/L (ref 19–32)
Calcium: 10 mg/dL (ref 8.4–10.5)
Chloride: 101 mEq/L (ref 96–112)
Glucose, Bld: 104 mg/dL — ABNORMAL HIGH (ref 70–99)
Potassium: 3.3 mEq/L — ABNORMAL LOW (ref 3.5–5.3)
Sodium: 141 mEq/L (ref 135–145)
Total Protein: 7.4 g/dL (ref 6.0–8.3)

## 2013-09-01 LAB — CBC WITH DIFFERENTIAL/PLATELET
Basophils Relative: 0 % (ref 0–1)
HCT: 42 % (ref 39.0–52.0)
Hemoglobin: 14.2 g/dL (ref 13.0–17.0)
Lymphocytes Relative: 37 % (ref 12–46)
Lymphs Abs: 1.8 10*3/uL (ref 0.7–4.0)
Monocytes Absolute: 0.5 10*3/uL (ref 0.1–1.0)
Monocytes Relative: 10 % (ref 3–12)
Neutro Abs: 2.6 10*3/uL (ref 1.7–7.7)
Neutrophils Relative %: 52 % (ref 43–77)
RBC: 4.99 MIL/uL (ref 4.22–5.81)
WBC: 4.9 10*3/uL (ref 4.0–10.5)

## 2013-09-01 LAB — LIPID PANEL
LDL Cholesterol: 124 mg/dL — ABNORMAL HIGH (ref 0–99)
Triglycerides: 179 mg/dL — ABNORMAL HIGH (ref <150)

## 2013-09-01 MED ORDER — LOSARTAN POTASSIUM-HCTZ 100-25 MG PO TABS: 1.0000 | ORAL_TABLET | Freq: Every day | ORAL | Status: DC

## 2013-09-01 MED ORDER — AMLODIPINE BESYLATE 10 MG PO TABS: 10.0000 mg | ORAL_TABLET | Freq: Every day | ORAL | Status: DC

## 2013-09-01 MED ORDER — FLUTICASONE PROPIONATE 50 MCG/ACT NA SUSP: 2.0000 | Freq: Every day | NASAL | Status: DC

## 2013-09-01 MED ORDER — ROSUVASTATIN CALCIUM 20 MG PO TABS: 20.0000 mg | ORAL_TABLET | Freq: Every day | ORAL | Status: DC

## 2013-09-01 MED ORDER — POTASSIUM CHLORIDE CRYS ER 20 MEQ PO TBCR: 20.0000 meq | EXTENDED_RELEASE_TABLET | Freq: Every day | ORAL | Status: DC

## 2013-09-01 NOTE — Telephone Encounter (Signed)
[facility], norvasc, hyzaar, kdur. amd crestor on 09/01/2013

## 2013-09-01 NOTE — Progress Notes (Signed)
  Subjective:    Patient ID: Bryce Rodriguez, male    DOB: [birth year], 62 y.o.   MRN: [id number]  HPI patient enters for followup high blood pressure high cholesterol he is having significant pain in his right shoulder. 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 He denies any specific injury to his shoulder but has difficulty with movement of the shoulder.   Review of Systems     Objective:   Physical Exam HEENT exam is unremarkable. Pressure is up slightly today at 150/86 his neck is supple chest clear heart regular rate no murmurs abdomen soft nontender extremities without edema the  [facility] reading (PRIMARY) by  Dr.Tanique Matney there is mild a.c. joint arthritis otherwise films are unremarkable        Assessment & Plan:  We'll proceed with x-rays of the right shoulder. Routine labs were done for his hypertension and high cholesterol.

## 2013-09-03 ENCOUNTER — Other Ambulatory Visit: Payer: Self-pay | Admitting: *Deleted

## 2013-09-03 DIAGNOSIS — I10 Essential (primary) hypertension: Secondary | ICD-10-CM

## 2013-09-03 DIAGNOSIS — E785 Hyperlipidemia, unspecified: Secondary | ICD-10-CM

## 2013-09-03 DIAGNOSIS — J069 Acute upper respiratory infection, unspecified: Secondary | ICD-10-CM

## 2013-09-03 MED ORDER — LOSARTAN POTASSIUM-HCTZ 100-25 MG PO TABS: 1.0000 | ORAL_TABLET | Freq: Every day | ORAL | Status: DC

## 2013-09-03 MED ORDER — ROSUVASTATIN CALCIUM 20 MG PO TABS: 20.0000 mg | ORAL_TABLET | Freq: Every day | ORAL | Status: DC

## 2013-09-03 MED ORDER — POTASSIUM CHLORIDE CRYS ER 20 MEQ PO TBCR: 20.0000 meq | EXTENDED_RELEASE_TABLET | Freq: Two times a day (BID) | ORAL | Status: DC

## 2013-09-03 MED ORDER — AMLODIPINE BESYLATE 10 MG PO TABS: 10.0000 mg | ORAL_TABLET | Freq: Every day | ORAL | Status: DC

## 2013-09-03 MED ORDER — FLUTICASONE PROPIONATE 50 MCG/ACT NA SUSP: 2.0000 | Freq: Every day | NASAL | Status: DC

## 2013-09-14 ENCOUNTER — Other Ambulatory Visit: Payer: Self-pay | Admitting: Physician Assistant

## 2013-09-19 ENCOUNTER — Telehealth: Payer: Self-pay

## 2013-09-19 DIAGNOSIS — E785 Hyperlipidemia, unspecified: Secondary | ICD-10-CM

## 2013-09-19 MED ORDER — LOSARTAN POTASSIUM-HCTZ 100-25 MG PO TABS: ORAL_TABLET | ORAL | Status: DC

## 2013-09-19 MED ORDER — AMLODIPINE BESYLATE 10 MG PO TABS: ORAL_TABLET | ORAL | Status: DC

## 2013-09-19 MED ORDER — ROSUVASTATIN CALCIUM 20 MG PO TABS: 20.0000 mg | ORAL_TABLET | Freq: Every day | ORAL | Status: DC

## 2013-09-19 NOTE — Telephone Encounter (Signed)
Called patient he states he was to have a referral for ortho, but he has not heard back. Will you check on this for him? [facility], [facility] were sent on 9/17 but patient indicates they did not receive these.

## 2013-09-19 NOTE — Telephone Encounter (Signed)
Pt is wanting to talk with dr dauib about a referral   Best number is [phone] or [phone]

## 2013-09-25 ENCOUNTER — Encounter: Payer: Self-pay | Admitting: Cardiovascular Disease

## 2013-09-30 ENCOUNTER — Other Ambulatory Visit: Payer: Self-pay | Admitting: Physician Assistant

## 2013-10-08 ENCOUNTER — Telehealth: Payer: Self-pay

## 2013-10-08 NOTE — Telephone Encounter (Signed)
Patient says he is supposed to have been referred to a back specialist and never got the appt please call patient with questions at [phone]

## 2013-10-09 NOTE — Telephone Encounter (Signed)
Called again,unable to reach letter sent.

## 2013-10-09 NOTE — Telephone Encounter (Signed)
Do not see back specialist mentioned. He was sent to ortho for his shoulder. Called patient, no answer.

## 2013-11-06 ENCOUNTER — Telehealth: Payer: Self-pay

## 2013-11-06 NOTE — Telephone Encounter (Signed)
Was expecting to hear about a referral for dr randall (ortho). Has not heard anything. Can you please let pt know the status?

## 2013-11-06 NOTE — Telephone Encounter (Signed)
Can you check on this referral 

## 2013-12-25 ENCOUNTER — Other Ambulatory Visit: Payer: Self-pay | Admitting: Physician Assistant

## 2014-01-03 ENCOUNTER — Other Ambulatory Visit: Payer: Self-pay | Admitting: Physician Assistant

## 2014-02-12 ENCOUNTER — Other Ambulatory Visit: Payer: Self-pay | Admitting: Physician Assistant

## 2014-02-24 ENCOUNTER — Ambulatory Visit: Payer: BC Managed Care – PPO

## 2014-02-24 ENCOUNTER — Encounter: Payer: Self-pay | Admitting: Emergency Medicine

## 2014-02-24 ENCOUNTER — Ambulatory Visit (INDEPENDENT_AMBULATORY_CARE_PROVIDER_SITE_OTHER): Payer: BC Managed Care – PPO | Admitting: Emergency Medicine

## 2014-02-24 VITALS — BP 142/80 | HR 75 | Temp 98.4°F | Resp 16 | Ht 65.75 in | Wt 198.2 lb

## 2014-02-24 DIAGNOSIS — J069 Acute upper respiratory infection, unspecified: Secondary | ICD-10-CM

## 2014-02-24 DIAGNOSIS — M79646 Pain in unspecified finger(s): Secondary | ICD-10-CM

## 2014-02-24 DIAGNOSIS — M1990 Unspecified osteoarthritis, unspecified site: Secondary | ICD-10-CM

## 2014-02-24 DIAGNOSIS — E7800 Pure hypercholesterolemia, unspecified: Secondary | ICD-10-CM

## 2014-02-24 DIAGNOSIS — M79609 Pain in unspecified limb: Secondary | ICD-10-CM

## 2014-02-24 DIAGNOSIS — M545 Low back pain, unspecified: Secondary | ICD-10-CM

## 2014-02-24 DIAGNOSIS — I10 Essential (primary) hypertension: Secondary | ICD-10-CM

## 2014-02-24 DIAGNOSIS — IMO0001 Reserved for inherently not codable concepts without codable children: Secondary | ICD-10-CM

## 2014-02-24 HISTORY — DX: Unspecified osteoarthritis, unspecified site: M19.90

## 2014-02-24 LAB — LIPID PANEL
CHOLESTEROL: 174 mg/dL (ref 0–200)
HDL: 45 mg/dL (ref >39)
LDL Cholesterol: 85 mg/dL (ref 0–99)
Total CHOL/HDL Ratio: 3.9 Ratio
Triglycerides: 222 mg/dL — ABNORMAL HIGH (ref <150)
VLDL: 44 mg/dL — ABNORMAL HIGH (ref 0–40)

## 2014-02-24 LAB — COMPLETE METABOLIC PANEL WITH GFR
ALK PHOS: 49 U/L (ref 39–117)
ALT: 53 U/L (ref 0–53)
AST: 30 U/L (ref 0–37)
Albumin: 4.5 g/dL (ref 3.5–5.2)
BUN: 18 mg/dL (ref 6–23)
CO2: 29 mEq/L (ref 19–32)
Calcium: 9.4 mg/dL (ref 8.4–10.5)
Chloride: 100 mEq/L (ref 96–112)
Creat: 1.09 mg/dL (ref 0.50–1.35)
GFR, EST NON AFRICAN AMERICAN: 73 mL/min
GFR, Est African American: 84 mL/min
Glucose, Bld: 102 mg/dL — ABNORMAL HIGH (ref 70–99)
Potassium: 3.3 mEq/L — ABNORMAL LOW (ref 3.5–5.3)
SODIUM: 140 meq/L (ref 135–145)
TOTAL PROTEIN: 7.2 g/dL (ref 6.0–8.3)
Total Bilirubin: 0.5 mg/dL (ref 0.2–1.2)

## 2014-02-24 LAB — CK: Total CK: 212 U/L (ref 7–232)

## 2014-02-24 MED ORDER — FLUTICASONE PROPIONATE 50 MCG/ACT NA SUSP: 2.0000 | Freq: Every day | NASAL | Status: DC

## 2014-02-24 MED ORDER — MELOXICAM 15 MG PO TABS: 15.0000 mg | ORAL_TABLET | Freq: Every day | ORAL | Status: DC

## 2014-02-24 MED ORDER — AMLODIPINE BESYLATE 10 MG PO TABS: ORAL_TABLET | ORAL | Status: DC

## 2014-02-24 MED ORDER — ROSUVASTATIN CALCIUM 20 MG PO TABS: 20.0000 mg | ORAL_TABLET | Freq: Every day | ORAL | Status: DC

## 2014-02-24 MED ORDER — LOSARTAN POTASSIUM-HCTZ 100-25 MG PO TABS: ORAL_TABLET | ORAL | Status: DC

## 2014-02-24 NOTE — Progress Notes (Addendum)
Subjective:  This chart was scribed for Jenny Reichmann, MD by Mercy Moore, Medial Scribe. This patient was seen in room 21 and the patient's care was started at 8:17 AM.    Patient ID: Bryce Rodriguez, male    DOB: 1951/09/06, 63 y.o.   MRN: [id number]  HPI Bryce Rodriguez is a 63 y.o. male complaining of lower back pain that radiates to legs, bilaterally and 2.5 weeks of soreness and weakness in right thumb that extends from right shoulder and arm pain. Patient reports that his symptoms present suddenly in the morning upon getting out of bed. Patient suspects his pain may be arthritis, but is unsure. Patient reports that he has been managing pain with 750 mg prescription (unknown) given by Daub and Excedrin, from which he experiences temporary relief.  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   Patient Active Problem List   Diagnosis Date Noted  . HTN (hypertension) 01/23/2012  . Hypercholesteremia 01/23/2012   Past Medical History  Diagnosis Date  . HTN (hypertension)   . HLD (hyperlipidemia)    Past Surgical History  Procedure Laterality Date  . None     No Known Allergies Prior to Admission medications   Medication Sig Start Date End Date Taking? Authorizing Provider  Alum & Mag Hydroxide-Simeth (MAGIC MOUTHWASH W/LIDOCAINE) SOLN 1 tsp po q1-2h prn sore throat 09/29/12   Mancel Bale, PA-C  amLODipine (NORVASC) 10 MG tablet TAKE 1 TABLET BY MOUTH EVERY DAY 09/19/13   Darlyne Russian, MD  amLODipine (NORVASC) 10 MG tablet TAKE 1 TABLET BY MOUTH EVERY DAY 01/03/14   Darlyne Russian, MD  aspirin 81 MG tablet Take 81 mg by mouth daily.    Historical Provider, MD  benzonatate (TESSALON) 100 MG capsule Take 1-2 capsules (100-200 mg total) by mouth 3 (three) times daily as needed for cough. 10/15/12   Darlyne Russian, MD  chlorpheniramine-HYDROcodone (TUSSIONEX PENNKINETIC ER) 10-8 MG/5ML LQCR Take 5 mLs by mouth every 12 (twelve) hours.  09/29/12   Mancel Bale, PA-C  fluticasone (FLONASE) 50 MCG/ACT nasal spray Place 2 sprays into the nose daily. 09/03/13   Darlyne Russian, MD  Guaifenesin [facility] MAXIMUM STRENGTH) 1200 MG TB12 Take 1 tablet (1,200 mg total) by mouth 2 (two) times daily. 01/23/12   Mancel Bale, PA-C  losartan-hydrochlorothiazide (HYZAAR) 100-25 MG per tablet TAKE 1 TABLET BY MOUTH EVERY DAY 09/19/13   Darlyne Russian, MD  losartan-hydrochlorothiazide [facility]) 100-25 MG per tablet TAKE 1 TABLET BY MOUTH EVERY DAY 12/25/13   Rise Mu, PA-C  meloxicam (MOBIC) 15 MG tablet Take 1 tablet (15 mg total) by mouth daily. 10/19/12   Chelle S Jeffery, PA-C  potassium chloride SA (K-DUR,KLOR-CON) 20 MEQ tablet Take 1 tablet (20 mEq total) by mouth 2 (two) times daily. 09/03/13   Darlyne Russian, MD  rosuvastatin (CRESTOR) 20 MG tablet Take 1 tablet (20 mg total) by mouth daily. PATIENT NEEDS OFFICE VISIT FOR ADDITIONAL REFILLS    Darlyne Russian, MD   History   Social History  . Marital Status: Married    Spouse Name: N/A    Number of Children: 2  . Years of Education: N/A   Occupational History  . 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 Smoking status: Never Smoker   . Smokeless tobacco: Current User    Types: Snuff  . Alcohol Use:  1.0 oz/week    2 drink(s) per week  . Drug Use: Not on file  . Sexual Activity: Not on file   Other Topics Concern  . Not on file   Social History Narrative  . No narrative on file    Review of Systems  Musculoskeletal: Positive for arthralgias, back pain and myalgias.  Neurological: Positive for weakness.       Objective:   Physical Exam  Nursing note and vitals reviewed.   CONSTITUTIONAL: Well developed/well nourished HEAD: Normocephalic/atraumatic EYES: EOMI/PERRL ENMT: Mucous membranes moist NECK: supple no meningeal signs SPINE: tenderness over the lower back CV: S1/S2 noted, no murmurs/rubs/gallops noted LUNGS: Lungs are clear to auscultation  bilaterally, no apparent distress ABDOMEN: soft, nontender, no rebound or guarding GU: no cva tenderness NEURO: Pt is awake/alert, moves all extremitiesx4 EXTREMITIES: pulses normal, full ROM; tenderness at base of right thumb  SKIN: warm, color normal PSYCH: no abnormalities of mood noted [facility] reading (PRIMARY) by  Dr.Daub there are mild degenerative changes at the base of the first meta-carpal. LS-spine films revealed a grade 1 L4-5 spondylolisthesis.       Assessment & Plan:  I did check a CPK because of his myalgias. He appears to have some arthritic issues involving his right shoulder and has a spondylolisthesis of his lower lumbar spine. We'll give a trial of 2 weeks of Mobic and see if this helps. His routine labs to followup his hypertension and lipid panel were also done. Referral made to go for orthopedic eval

## 2014-02-24 NOTE — Addendum Note (Signed)
Addended byCandice Camp on: 02/24/2014 10:07 AM   Modules accepted: Orders

## 2014-02-25 ENCOUNTER — Encounter: Payer: Self-pay | Admitting: *Deleted

## 2014-02-25 ENCOUNTER — Other Ambulatory Visit: Payer: Self-pay | Admitting: Physician Assistant

## 2014-03-05 ENCOUNTER — Telehealth: Payer: Self-pay

## 2014-03-05 NOTE — Telephone Encounter (Signed)
X-ray copy needed  [phone]

## 2014-05-01 DIAGNOSIS — R200 Anesthesia of skin: Secondary | ICD-10-CM | POA: Insufficient documentation

## 2014-05-01 DIAGNOSIS — R202 Paresthesia of skin: Principal | ICD-10-CM

## 2014-05-01 HISTORY — DX: Paresthesia of skin: R20.0

## 2014-05-18 ENCOUNTER — Other Ambulatory Visit: Payer: Self-pay | Admitting: Emergency Medicine

## 2014-05-19 HISTORY — PX: NECK SURGERY: SHX720

## 2014-06-16 ENCOUNTER — Other Ambulatory Visit: Payer: Self-pay | Admitting: Emergency Medicine

## 2014-06-23 ENCOUNTER — Telehealth: Payer: Self-pay

## 2014-06-23 NOTE — Telephone Encounter (Signed)
Needs a refill on CRESTOR 20 MG tablet sent into [facility], please contact by home or cell number, Pt of Dr. Everlene Farrier

## 2014-06-23 NOTE — Telephone Encounter (Signed)
[facility]. Pt will pick up.

## 2014-10-01 ENCOUNTER — Telehealth: Payer: Self-pay

## 2014-10-01 NOTE — Telephone Encounter (Signed)
Patient called. Needs refills sent to mail order pharmacy that he uses. Needs amlodipine and crestor refilled. CB # [id number]

## 2014-10-03 MED ORDER — AMLODIPINE BESYLATE 10 MG PO TABS: ORAL_TABLET | ORAL | Status: DC

## 2014-10-03 MED ORDER — ROSUVASTATIN CALCIUM 20 MG PO TABS: ORAL_TABLET | ORAL | Status: DC

## 2014-10-04 ENCOUNTER — Ambulatory Visit (INDEPENDENT_AMBULATORY_CARE_PROVIDER_SITE_OTHER): Payer: BC Managed Care – PPO | Admitting: Physician Assistant

## 2014-10-04 VITALS — BP 165/96 | HR 61 | Temp 98.4°F | Resp 20 | Ht 65.5 in | Wt 198.4 lb

## 2014-10-04 DIAGNOSIS — R030 Elevated blood-pressure reading, without diagnosis of hypertension: Secondary | ICD-10-CM

## 2014-10-04 DIAGNOSIS — B9789 Other viral agents as the cause of diseases classified elsewhere: Principal | ICD-10-CM

## 2014-10-04 DIAGNOSIS — IMO0001 Reserved for inherently not codable concepts without codable children: Secondary | ICD-10-CM

## 2014-10-04 DIAGNOSIS — J069 Acute upper respiratory infection, unspecified: Secondary | ICD-10-CM

## 2014-10-04 MED ORDER — GUAIFENESIN ER 1200 MG PO TB12: 1.0000 | ORAL_TABLET | Freq: Two times a day (BID) | ORAL | Status: DC | PRN

## 2014-10-04 MED ORDER — HYDROCOD POLST-CHLORPHEN POLST 10-8 MG/5ML PO LQCR: 5.0000 mL | Freq: Two times a day (BID) | ORAL | Status: DC | PRN

## 2014-10-04 MED ORDER — BENZONATATE 100 MG PO CAPS: 100.0000 mg | ORAL_CAPSULE | Freq: Three times a day (TID) | ORAL | Status: DC | PRN

## 2014-10-04 NOTE — Progress Notes (Signed)
I have discussed this patient with Mr. Carlis Abbott, [place] and agree.

## 2014-10-04 NOTE — Progress Notes (Signed)
IDENTIFYING INFORMATION  Bryce Rodriguez / male / 04/28/1951 / 63 y.o. / MRN: [id number]  The patient has HTN (hypertension); Hypercholesteremia; Arthritis; and Numbness and tingling in hands on his problem list.  SUBJECTIVE  Chief Complaint: Cough and Sinusitis   History of present illness:  This illness began with sore throat 5 days ago, and the pain was 9/10 and was burning in nature.  He "felt like he was catching a cold.  He then proceeded to runny eyes, coughing, sneezing and body aches. He reports he is here today d/t cough.  His cough is mildly productive, and has clear secretions.  He denies pink frothy sputum.  He denies a history of pneumonia.  He does report chills, subjective fever, and denies night sweats. He is not a diabetic is a never smoker.   He is eating and drinking well.  He has been taking Micinex DM for this and is helping somewhat.     The patient reports that he has not been taking his BP meds as they have not been filled from his mail order pharmacy.  He has called the office regarding this and [facility]     The patient has a current medication list which includes the following prescription(s): magic mouthwash w/lidocaine, amlodipine, aspirin, fluticasone, losartan-hydrochlorothiazide, meloxicam, potassium chloride sa, rosuvastatin, benzonatate, chlorpheniramine-hydrocodone, and guaifenesin.  Bryce Rodriguez has No Known Allergies. and he  reports that he has never smoked. His smokeless tobacco use includes Snuff. He reports that he drinks about one ounce of alcohol per week. He reports that he does not use illicit drugs.  The patient  has past surgical history that includes none and Neck surgery (May 19 2014). and his family history includes Cancer in an other family member; Hypertension in an other family member.  Review of Systems  Constitutional: Positive for fever (subjective), chills and malaise/fatigue. Negative for weight loss  and diaphoresis.  HENT: Positive for congestion. Negative for ear discharge, ear pain and nosebleeds.   Eyes: Negative.   Respiratory: Positive for cough, sputum production and wheezing (mild, last night). Negative for hemoptysis and shortness of breath.   Cardiovascular: Positive for chest pain (sore, d/t coughing).  Gastrointestinal: Negative.   Genitourinary: Negative.   Skin: Negative.   Neurological: Negative for dizziness and weakness.    OBJECTIVE  Blood pressure 165/96, pulse 61, temperature 98.4 F (36.9 C), temperature source Oral, resp. rate 20, height 5' 5.5" (1.664 m), weight 198 lb 6 oz (89.982 kg), SpO2 99.00%.  Physical Exam  Constitutional: He is oriented to person, place, and time and well-developed, well-nourished, and in no distress.  HENT:  Head: Normocephalic.  Right Ear: Hearing, tympanic membrane, external ear and ear canal normal.  Left Ear: Hearing, tympanic membrane, external ear and ear canal normal.  Nose: Nose normal. Right sinus exhibits no maxillary sinus tenderness and no frontal sinus tenderness. Left sinus exhibits no maxillary sinus tenderness and no frontal sinus tenderness.  Mouth/Throat: Uvula is midline. Mucous membranes are not pale, not dry and not cyanotic. No oral lesions. Posterior oropharyngeal edema present. No oropharyngeal exudate, posterior oropharyngeal erythema or tonsillar abscesses.  Cardiovascular: Normal rate, regular rhythm, normal heart sounds and intact distal pulses.   Pulmonary/Chest: Effort normal and breath sounds normal. No stridor. No respiratory distress. He has no wheezes. He has no rales. He exhibits no tenderness.  Abdominal: Bowel sounds are normal.  Musculoskeletal: Normal range of motion.  Lymphadenopathy:  Head (right side): No submental, no submandibular, no tonsillar, no preauricular, no posterior auricular and no occipital adenopathy present.       Head (left side): No submental, no submandibular, no  tonsillar, no preauricular, no posterior auricular and no occipital adenopathy present.    He has no cervical adenopathy.  Neurological: He is alert and oriented to person, place, and time.  Skin: Skin is warm and dry. No rash noted. He is not diaphoretic. No erythema. No pallor.  Psychiatric: Mood, memory, affect and judgment normal.    ASSESSMENT & PLAN  Viral URI with cough - Plan: chlorpheniramine-HYDROcodone (TUSSIONEX PENNKINETIC ER) 10-8 MG/5ML LQCR, Guaifenesin (MUCINEX MAXIMUM STRENGTH) 1200 MG TB12, benzonatate (TESSALON) 100 MG capsule.  Patient advised to drink lots of fluid.    Elevated BP: Medications that raise BP (e.g. Albuterol, pseudophed) were avoided given pt's BP.  Patient advised to refill his BP meds as quickly as possible, and to monitor his BP until it returns to baseline levels.    The patient was instructed to to call or comeback to clinic as needed, or should symptoms warrant.  Philis Fendt, MS, PA-C [facility] 10/04/2014 1:57 PM

## 2014-10-14 ENCOUNTER — Telehealth: Payer: Self-pay

## 2014-10-14 NOTE — Telephone Encounter (Signed)
Pt had his amlodipine and crestor filled on 10-03-14.  He says these are supposed to go through mail order and that is usually the way he gets these.  He wants it this way because he can get a three-month supply at one time.  He also says there were supposed to be two more that were to be filled that were not called in.  One was losartan and the other is the "really big pill" (he says one is for BP).  Please call asap to get this straightened out.

## 2014-10-16 MED ORDER — LOSARTAN POTASSIUM-HCTZ 100-25 MG PO TABS: ORAL_TABLET | ORAL | Status: DC

## 2014-10-16 MED ORDER — POTASSIUM CHLORIDE CRYS ER 20 MEQ PO TBCR: 20.0000 meq | EXTENDED_RELEASE_TABLET | Freq: Two times a day (BID) | ORAL | Status: DC

## 2014-10-16 MED ORDER — ROSUVASTATIN CALCIUM 20 MG PO TABS: ORAL_TABLET | ORAL | Status: DC

## 2014-10-16 MED ORDER — AMLODIPINE BESYLATE 10 MG PO TABS: ORAL_TABLET | ORAL | Status: DC

## 2014-10-16 NOTE — Telephone Encounter (Signed)
Pt called back and did verify that the potassium is the other med he needs from [facility] mail order. I will take the incorrect mail order out of [facility] to update and sending in all 4 Rxs.

## 2014-10-16 NOTE — Telephone Encounter (Signed)
Tried to call pt to clarify if large pill pt needs is his potassium? Also verify which mail order.

## 2014-10-27 ENCOUNTER — Telehealth: Payer: Self-pay

## 2014-10-27 MED ORDER — ROSUVASTATIN CALCIUM 20 MG PO TABS: ORAL_TABLET | ORAL | Status: DC

## 2014-10-27 MED ORDER — AMLODIPINE BESYLATE 10 MG PO TABS: ORAL_TABLET | ORAL | Status: DC

## 2014-10-27 MED ORDER — POTASSIUM CHLORIDE CRYS ER 20 MEQ PO TBCR: 20.0000 meq | EXTENDED_RELEASE_TABLET | Freq: Two times a day (BID) | ORAL | Status: DC

## 2014-10-27 MED ORDER — LOSARTAN POTASSIUM-HCTZ 100-25 MG PO TABS: ORAL_TABLET | ORAL | Status: DC

## 2014-10-27 NOTE — Telephone Encounter (Signed)
Pt called and advised he gave [place] the wrong mail order when he asked for Rxs. They were supposed to go to [facility]. I asked if he needs any locally to hold him until they arrive and he would like 1 wk sent to [facility] [place] of crestor, potassium and losartan. He has enough amlodipine to last him. I called # pt gave me from his bottles and verified that I need to send to [facility]. Sent all four to mail order, and the 3 needed to local. Pt notified when done as req'd.

## 2014-11-19 ENCOUNTER — Ambulatory Visit: Payer: BC Managed Care – PPO | Admitting: Emergency Medicine

## 2014-11-28 ENCOUNTER — Other Ambulatory Visit: Payer: Self-pay | Admitting: Physician Assistant

## 2014-12-08 ENCOUNTER — Ambulatory Visit (INDEPENDENT_AMBULATORY_CARE_PROVIDER_SITE_OTHER): Payer: BC Managed Care – PPO | Admitting: Emergency Medicine

## 2014-12-08 ENCOUNTER — Encounter: Payer: Self-pay | Admitting: Emergency Medicine

## 2014-12-08 VITALS — BP 140/70 | HR 72 | Temp 98.2°F | Resp 16 | Ht 66.0 in | Wt 192.0 lb

## 2014-12-08 DIAGNOSIS — R030 Elevated blood-pressure reading, without diagnosis of hypertension: Secondary | ICD-10-CM

## 2014-12-08 DIAGNOSIS — IMO0001 Reserved for inherently not codable concepts without codable children: Secondary | ICD-10-CM

## 2014-12-08 DIAGNOSIS — R1310 Dysphagia, unspecified: Secondary | ICD-10-CM

## 2014-12-08 DIAGNOSIS — M545 Low back pain: Secondary | ICD-10-CM

## 2014-12-08 DIAGNOSIS — R351 Nocturia: Secondary | ICD-10-CM

## 2014-12-08 LAB — CBC WITH DIFFERENTIAL/PLATELET
BASOS PCT: 1 % (ref 0–1)
Basophils Absolute: 0.1 10*3/uL (ref 0.0–0.1)
EOS ABS: 0.1 10*3/uL (ref 0.0–0.7)
Eosinophils Relative: 1 % (ref 0–5)
HCT: 42 % (ref 39.0–52.0)
Hemoglobin: 14.2 g/dL (ref 13.0–17.0)
Lymphocytes Relative: 26 % (ref 12–46)
Lymphs Abs: 1.5 10*3/uL (ref 0.7–4.0)
MCH: 28.6 pg (ref 26.0–34.0)
MCHC: 33.8 g/dL (ref 30.0–36.0)
MCV: 84.5 fL (ref 78.0–100.0)
MONOS PCT: 11 % (ref 3–12)
MPV: 9.3 fL — ABNORMAL LOW (ref 9.4–12.4)
Monocytes Absolute: 0.6 10*3/uL (ref 0.1–1.0)
Neutro Abs: 3.4 10*3/uL (ref 1.7–7.7)
Neutrophils Relative %: 61 % (ref 43–77)
Platelets: 281 10*3/uL (ref 150–400)
RBC: 4.97 MIL/uL (ref 4.22–5.81)
RDW: 14.3 % (ref 11.5–15.5)
WBC: 5.6 10*3/uL (ref 4.0–10.5)

## 2014-12-08 LAB — POCT URINALYSIS DIPSTICK
BILIRUBIN UA: NEGATIVE
Blood, UA: NEGATIVE
Glucose, UA: NEGATIVE
KETONES UA: NEGATIVE
Leukocytes, UA: NEGATIVE
Nitrite, UA: NEGATIVE
PH UA: 7
PROTEIN UA: NEGATIVE
SPEC GRAV UA: 1.02
Urobilinogen, UA: 0.2

## 2014-12-08 LAB — POCT UA - MICROSCOPIC ONLY
BACTERIA, U MICROSCOPIC: NEGATIVE
CASTS, UR, LPF, POC: NEGATIVE
Crystals, Ur, HPF, POC: NEGATIVE
Epithelial cells, urine per micros: NEGATIVE
MUCUS UA: NEGATIVE
RBC, URINE, MICROSCOPIC: NEGATIVE
Yeast, UA: NEGATIVE

## 2014-12-08 LAB — IFOBT (OCCULT BLOOD): IFOBT: NEGATIVE

## 2014-12-08 MED ORDER — TAMSULOSIN HCL 0.4 MG PO CAPS: 0.4000 mg | ORAL_CAPSULE | Freq: Every day | ORAL | Status: DC

## 2014-12-08 NOTE — Patient Instructions (Signed)
Benign Prostatic Hyperplasia An enlarged prostate (benign prostatic hyperplasia) is common in older men. You may experience the following:  Weak urine stream.  Dribbling.  Feeling like the bladder has not emptied completely.  Difficulty starting urination.  Getting up frequently at night to urinate.  Urinating more frequently during the day. HOME CARE INSTRUCTIONS  Monitor your prostatic hyperplasia for any changes. The following actions may help to alleviate any discomfort you are experiencing:  Give yourself time when you urinate.  Stay away from alcohol.  Avoid beverages containing caffeine, such as coffee, tea, and colas, because they can make the problem worse.  Avoid decongestants, antihistamines, and some prescription medicines that can make the problem worse.  Follow up with your health care provider for further treatment as recommended. SEEK MEDICAL CARE IF:  You are experiencing progressive difficulty voiding.  Your urine stream is progressively getting narrower.  You are awaking from sleep with the urge to void more frequently.  You are constantly feeling the need to void.  You experience loss of urine, especially in small amounts. SEEK IMMEDIATE MEDICAL CARE IF:   You develop increased pain with urination or are unable to urinate.  You develop severe abdominal pain, vomiting, a high fever, or fainting.  You develop back pain or blood in your urine. MAKE SURE YOU:   Understand these instructions.  Will watch your condition.  Will get help right away if you are not doing well or get worse. Document Released: 12/04/2005 Document Revised: 08/06/2013 Document Reviewed: 05/06/2013 ExitCare Patient Information 2015 [employer]. This information is not intended to replace advice given to you by your health care provider. Make sure you discuss any questions you have with your health care provider.  

## 2014-12-08 NOTE — Progress Notes (Addendum)
Subjective:  This chart was scribed for Nena Jordan, MD by Dellis Filbert, ED Scribe at [facility].The patient was seen in exam room 22 and the patient's care was started at 9:44 AM.   Patient ID: Bryce Rodriguez, male    DOB: March 22, 1951, 63 y.o.   MRN: [id number] Chief Complaint  Patient presents with  . Back Pain    pain / pressure feels increased urge to urinate   HPI HPI Comments: Bryce Rodriguez is a 63 y.o. male who presents to [facility] complaining of lower back pressure and a sensation that something is lodged in the back of his throat.  The throat sensation has been ongoing since his neck surgery in June 2015. Pt has also been coughing up an execssive amount of phlegm. Pt notes having numbness in his hands and shoulder since his surgery.  He has lower back pressure that causes an urge to urinate. The pressure occassionally radiates down to his legs. When he does urinate the stream is consistent. Pt does have nocturia urinary frequency, he notes waking 2-3 times in the night. Pt is also concerned that he is not emptying his completley because after he is finished he has a "dribble".  Pt does not smoke He is UTD on his influenza immunization. Immunization History  Administered Date(s) Administered  . Influenza Split 09/18/2012  . Influenza-Unspecified 09/18/2012, 10/24/2014   Patient Active Problem List   Diagnosis Date Noted  . Numbness and tingling in hands 05/01/2014  . Arthritis 02/24/2014  . HTN (hypertension) 01/23/2012  . Hypercholesteremia 01/23/2012   Past Medical History  Diagnosis Date  . HTN (hypertension)   . HLD (hyperlipidemia)    Past Surgical History  Procedure Laterality Date  . None    . Neck surgery  May 19 2014   No Known Allergies Prior to Admission medications   Medication Sig Start Date End Date Taking? Authorizing Provider  amLODipine (NORVASC) 10 MG tablet TAKE 1 TABLET BY MOUTH EVERY DAY. 10/27/14  Yes Darlyne Russian, MD    aspirin 81 MG tablet Take 81 mg by mouth daily.   Yes Historical Provider, MD  fluticasone (FLONASE) 50 MCG/ACT nasal spray Place 2 sprays into both nostrils daily. 02/24/14  Yes Darlyne Russian, MD  losartan-hydrochlorothiazide (HYZAAR) 100-25 MG per tablet TAKE 1 TABLET BY MOUTH EVERY DAY 10/27/14  Yes Darlyne Russian, MD  meloxicam (MOBIC) 15 MG tablet Take 1 tablet (15 mg total) by mouth daily. 02/24/14  Yes Darlyne Russian, MD  potassium chloride SA (KLOR-CON M20) 20 MEQ tablet Take 1 tablet (20 mEq total) by mouth 2 (two) times daily. 10/27/14  Yes Darlyne Russian, MD  rosuvastatin (CRESTOR) 20 MG tablet TAKE 1 TABLET BY MOUTH EVERY DAY. 10/27/14  Yes Darlyne Russian, MD   History   Social History  . Marital Status: Married    Spouse Name: N/A    Number of Children: 2  . Years of Education: N/A   Occupational History  . 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 Smoking status: Never Smoker   . Smokeless tobacco: Current User    Types: Snuff  . Alcohol Use: 1.0 oz/week    2 drink(s) per week  . Drug Use: No  . Sexual Activity: Not on file   Other Topics Concern  . Not on file   Social History Narrative   Review of Systems  Genitourinary: Positive for urgency and frequency.  Musculoskeletal: Positive for back pain.  Neurological: Positive for numbness.      Objective:   Physical Exam  Constitutional: He is oriented to person, place, and time. He appears well-developed and well-nourished.  HENT:  Head: Atraumatic. Microcephalic.  Mouth/Throat: Oropharynx is clear and moist. No oropharyngeal exudate, posterior oropharyngeal edema or posterior oropharyngeal erythema.  uvlula is in the midline  Eyes: EOM are normal.  Neck: Normal range of motion.  Healed posterior scar No masses felt.  Cardiovascular: Normal rate, regular rhythm and normal heart sounds.   No murmur heard. Pulmonary/Chest: Effort normal and breath sounds normal.  Genitourinary: Testes  normal and penis normal.  Prostate is small, no nodules. Does admit to mild discomfort with the examination.  Musculoskeletal: Normal range of motion.  Neurological: He is alert and oriented to person, place, and time.  Skin: Skin is warm and dry.  Psychiatric: He has a normal mood and affect. His behavior is normal.  Nursing note and vitals reviewed. BP 140/70 mmHg  Pulse 72  Temp(Src) 98.2 F (36.8 C)  Resp 16  Ht 5\' 6"  (1.676 m)  Wt 192 lb (87.091 kg)  BMI 31.00 kg/m2  SpO2 98% Results for orders placed or performed in visit on 12/08/14  POCT urinalysis dipstick  Result Value Ref Range   Color, UA Light Yellow    Clarity, UA Clear    Glucose, UA Negative    Bilirubin, UA Negative    Ketones, UA Negative    Spec Grav, UA 1.020    Blood, UA Negative    pH, UA 7.0    Protein, UA Negative    Urobilinogen, UA 0.2    Nitrite, UA Negative    Leukocytes, UA Negative   IFOBT POC (occult bld, rslt in office)  Result Value Ref Range   IFOBT Negative   POCT UA - Microscopic Only  Result Value Ref Range   WBC, Ur, HPF, POC 0-1    RBC, urine, microscopic Negative    Bacteria, U Microscopic Negative    Mucus, UA Negative    Epithelial cells, urine per micros Negative    Crystals, Ur, HPF, POC negative    Casts, Ur, LPF, POC Negative    Yeast, UA Negative       Assessment & Plan:  Will schedule a barium swallow. Will give a trial of Flomax to help with his urinary symptoms. He is going to follow-up with Dr. Lynann Bologna  regarding his low back pain.I personally performed the services described in this documentation, which was scribed in my presence. The recorded information has been reviewed and is accurate. If we do not get the answer with his barium swallow will make an ENT referral for them to look at the larynx and see if he has subglottic stenosis.

## 2014-12-09 LAB — PSA: PSA: 1.94 ng/mL (ref <=4.00)

## 2014-12-15 ENCOUNTER — Ambulatory Visit (HOSPITAL_COMMUNITY)
Admission: RE | Admit: 2014-12-15 | Discharge: 2014-12-15 | Disposition: A | Discharge status: Home / Self Care | Payer: BC Managed Care – PPO | Source: Ambulatory Visit | Attending: Emergency Medicine | Admitting: Emergency Medicine

## 2014-12-15 ENCOUNTER — Other Ambulatory Visit: Payer: Self-pay | Admitting: Emergency Medicine

## 2014-12-15 DIAGNOSIS — R1311 Dysphagia, oral phase: Secondary | ICD-10-CM | POA: Insufficient documentation

## 2014-12-15 DIAGNOSIS — R05 Cough: Secondary | ICD-10-CM | POA: Insufficient documentation

## 2014-12-15 DIAGNOSIS — R1310 Dysphagia, unspecified: Secondary | ICD-10-CM

## 2014-12-15 DIAGNOSIS — R200 Anesthesia of skin: Secondary | ICD-10-CM | POA: Diagnosis not present

## 2014-12-15 DIAGNOSIS — E780 Pure hypercholesterolemia: Secondary | ICD-10-CM | POA: Insufficient documentation

## 2014-12-15 DIAGNOSIS — R1313 Dysphagia, pharyngeal phase: Secondary | ICD-10-CM | POA: Insufficient documentation

## 2014-12-15 DIAGNOSIS — I10 Essential (primary) hypertension: Secondary | ICD-10-CM | POA: Diagnosis not present

## 2014-12-15 NOTE — Procedures (Signed)
Objective Swallowing Evaluation: Modified Barium Swallowing Study  Patient Details  Name: Bryce Rodriguez MRN: [id number] Date of Birth: [birth year]  Today's Date: 12/15/2014 Time: 1517-6160 SLP Time Calculation (min) (ACUTE ONLY): 19 min  Past Medical History:  Past Medical History  Diagnosis Date  . HTN (hypertension)   . HLD (hyperlipidemia)    Past Surgical History:  Past Surgical History  Procedure Laterality Date  . None    . Neck surgery  May 19 2014   HPI:  63 yo male referred for MBS due to pt report to MD of dysphagia since cervical spine surgery June 2015.  Pt has also been coughing up phlegm per MD office note.  PMH + for HTN, numbness and tingling of hands 04/2014, HTN 01/2012, arthritis 02/2014, hypercholesterolemia 01/2012.  Pt is smokeless tobacco user and consumes approx 2 ETOH drinks per week per office MD note.  Pt denies to this SLP symptoms of dysphagia but states he feels "as if something is growing in there" pointing to his throat.   He denies unintended weight loss, pulmonary infections nor requiring heimlich manuever.  He also admits to throat clearing worsening since surgery.        Assessment / Plan / Recommendation Clinical Impression  Dysphagia Diagnosis: Minimal oral phase dysphagia;Minimalpharyngeal phase dysphagia  Clinical impression: Minimal oropharyngeal dysphagia with minimal weakness, decreased oral propulsion noted.  No aspiration/penetration observed.  Minimal oropharyngeal (oral and pharyngeal tongue base) residuals noted with liquids only.  Solids cleared oropharynx adequately.  Liquid oral residuals prematurely spilled x1 into pharynx but pt reflexively swallows to clear.  Breathhold prior to swallow noted x1 with liquids - suspect compensation strategy created by pt as was effective for airway closure.    Of note, barium tablet taken with thin appeared to lodge in esophagus (? near aortic arch) with pt referrant sensation to pharynx.  More liquid  intake cleared tablet into stomach readily, radiologist not present to confirm.  Otherwise esophagus appeared to clear without delay upon sweep x2.    Suspect if pt is sensing residuals in pharynx that is referrant from esophageal dysphagia based on today's findings.  Using live video and teach back, SLP provided education re: results and compensation strategies.      Treatment Recommendation    n/a   Diet Recommendation Thin liquid;Regular   Liquid Administration via: Cup;Straw Medication Administration: Whole meds with liquid Supervision: Patient able to self feed Compensations: Slow rate;Small sips/bites (start meal with liquids) Postural Changes and/or Swallow Maneuvers: Seated upright 90 degrees;Upright 30-60 min after meal    Other  Recommendations   none  Follow Up Recommendations  None      General Date of Onset: 06/03/14 HPI: 63 yo male referred for MBS due to pt report to MD of dysphagia since cervical spine surgery June 2015.  Pt has also been coughing up phlegm per MD office note.  PMH + for HTN, numbness and tingling of hands 04/2014, HTN 01/2012, arthritis 02/2014, hypercholesterolemia 01/2012.  Pt is smokeless tobacco user and consumes approx 2 ETOH drinks per week per office MD note.  Pt denies to this SLP symptoms of dysphagia but states he feels "as if something is growing in there" pointing to his throat.   He denies unintended weight loss, pulmonary infections nor requiring heimlich manuever.  He also admits to throat clearing worsening since surgery.    Type of Study: Modified Barium Swallowing Study Reason for Referral: Objectively evaluate swallowing function Diet Prior to this Study:  Regular;Thin liquids Temperature Spikes Noted: No Respiratory Status: Room air History of Recent Intubation: No Behavior/Cognition: Alert;Cooperative Oral Cavity - Dentition: Adequate natural dentition (missing posterior dentition) Oral Motor / Sensory Function: Within functional limits  (? mildly decreased right lower facial movement) Self-Feeding Abilities: Able to feed self Patient Positioning: Upright in chair Baseline Vocal Quality: Clear Volitional Cough: Strong Volitional Swallow: Able to elicit Anatomy:  (note appearance of cervical hardware at C5-C6) Pharyngeal Secretions: Not observed secondary MBS    Reason for Referral Objectively evaluate swallowing function   Oral Phase Oral Preparation/Oral Phase Oral Phase: Impaired Oral - Nectar Oral - Nectar Cup: Reduced posterior propulsion Oral - Thin Oral - Thin Cup: Lingual/palatal residue;Reduced posterior propulsion Oral - Thin Straw: Lingual/palatal residue Oral - Solids Oral - Puree: Reduced posterior propulsion Oral - Regular: Reduced posterior propulsion;Impaired mastication Oral - Pill: Reduced posterior propulsion (pt required second bolus of thin barium to transit tablet into pharynx)   Pharyngeal Phase Pharyngeal Phase Pharyngeal Phase: Impaired Pharyngeal - Nectar Pharyngeal - Nectar Cup: Reduced tongue base retraction Pharyngeal - Thin Pharyngeal - Thin Cup: Reduced tongue base retraction Pharyngeal - Thin Straw: Reduced tongue base retraction Pharyngeal - Solids Pharyngeal - Puree: Within functional limits Pharyngeal - Regular: Within functional limits Pharyngeal - Pill: Within functional limits Pharyngeal Phase - Comment Pharyngeal Comment: minimal residuals noted with liquids on tongue base, premature spillage of liquid from oral cavity into pharynx after swallow observed, breathhold prior to swallow noted x1 - suspect compensation strategy as was effective for airway closure  Cervical Esophageal Phase    GO    Cervical Esophageal Phase Cervical Esophageal Phase: WFL (barium tablet taken with thin appeared to lodge in esophagus (? near aortic arch) with pt referrant sensation to pharynx, more liquid intake cleared tablet, radiologist not present to confirm, otherwise esophagus appeared  to clear without delay)    Functional Assessment Tool Used: mbs, clinical judgement Functional Limitations: Swallowing Swallow Current Status (Y2482): At least 1 percent but less than 20 percent impaired, limited or restricted Swallow Goal Status [phone]): At least 1 percent but less than 20 percent impaired, limited or restricted Swallow Discharge Status [phone]): At least 1 percent but less than 20 percent impaired, limited or restricted    Claudie Fisherman, [facility] SLP [phone]

## 2014-12-17 ENCOUNTER — Other Ambulatory Visit: Payer: Self-pay

## 2014-12-17 DIAGNOSIS — R1310 Dysphagia, unspecified: Secondary | ICD-10-CM

## 2015-03-06 ENCOUNTER — Other Ambulatory Visit: Payer: Self-pay | Admitting: Emergency Medicine

## 2015-03-06 NOTE — Telephone Encounter (Signed)
Patient called requesting a refill on Losartan 100-25 MG, Amlodipine 10 MG, and Crestor 20 MG. Patient request a week worth of each medication. Patient stated he only have about 4 to 5 pills left on the Crestor. Pharmacy [facility] on [address] [phone]

## 2015-03-07 NOTE — Telephone Encounter (Signed)
Give refills of all meds for 1 month

## 2015-03-08 MED ORDER — AMLODIPINE BESYLATE 10 MG PO TABS: ORAL_TABLET | ORAL | Status: DC

## 2015-03-08 MED ORDER — ROSUVASTATIN CALCIUM 20 MG PO TABS: ORAL_TABLET | ORAL | Status: DC

## 2015-03-08 MED ORDER — LOSARTAN POTASSIUM-HCTZ 100-25 MG PO TABS: ORAL_TABLET | ORAL | Status: DC

## 2015-03-08 NOTE — Telephone Encounter (Signed)
Sent in RFs and notified pt on VM and then need for f/up then for more.

## 2015-03-08 NOTE — Telephone Encounter (Signed)
Orders pended

## 2015-03-10 ENCOUNTER — Telehealth: Payer: Self-pay

## 2015-03-10 NOTE — Telephone Encounter (Signed)
Form in your box. i think pt needs to come in. Last labs done 02/2014. Please advise.

## 2015-03-10 NOTE — Telephone Encounter (Signed)
The patient dropped off form to be completed by Dr. Everlene Farrier.  The form has been placed in the nurse's box at 102.  The patient may be reached at [phone] when ready for pick up.

## 2015-03-10 NOTE — Telephone Encounter (Signed)
The last extensive labs were done on the patient a year ago. I do not think his company will except a form to be filled out with labs done a year ago. He can either make an appointment to be seen at 104 and I will draw labs and all he can see me at 102 walk-in and I can do those labs at that time.

## 2015-03-11 NOTE — Telephone Encounter (Signed)
Left message advising pt message from Dr. Everlene Farrier.

## 2015-03-12 ENCOUNTER — Other Ambulatory Visit: Payer: BLUE CROSS/BLUE SHIELD | Admitting: Family Medicine

## 2015-03-12 DIAGNOSIS — R739 Hyperglycemia, unspecified: Secondary | ICD-10-CM | POA: Diagnosis not present

## 2015-03-12 DIAGNOSIS — E7800 Pure hypercholesterolemia, unspecified: Secondary | ICD-10-CM

## 2015-03-12 LAB — LIPID PANEL
Cholesterol: 152 mg/dL (ref 0–200)
HDL: 52 mg/dL (ref >=40)
LDL Cholesterol: 82 mg/dL (ref 0–99)
Total CHOL/HDL Ratio: 2.9 Ratio
Triglycerides: 92 mg/dL (ref <150)
VLDL: 18 mg/dL (ref 0–40)

## 2015-03-12 LAB — GLUCOSE, POCT (MANUAL RESULT ENTRY): POC GLUCOSE: 83 mg/dL (ref 70–99)

## 2015-03-15 ENCOUNTER — Encounter: Payer: Self-pay | Admitting: Physician Assistant

## 2015-03-15 ENCOUNTER — Encounter: Payer: Self-pay | Admitting: Family Medicine

## 2015-03-15 ENCOUNTER — Ambulatory Visit (INDEPENDENT_AMBULATORY_CARE_PROVIDER_SITE_OTHER): Payer: BLUE CROSS/BLUE SHIELD | Admitting: Physician Assistant

## 2015-03-15 VITALS — BP 138/80 | HR 74 | Temp 98.8°F | Resp 16 | Ht 67.2 in | Wt 200.2 lb

## 2015-03-15 DIAGNOSIS — Z0000 Encounter for general adult medical examination without abnormal findings: Secondary | ICD-10-CM | POA: Diagnosis not present

## 2015-03-15 DIAGNOSIS — E876 Hypokalemia: Secondary | ICD-10-CM

## 2015-03-15 DIAGNOSIS — I10 Essential (primary) hypertension: Secondary | ICD-10-CM | POA: Diagnosis not present

## 2015-03-15 LAB — BASIC METABOLIC PANEL WITH GFR
BUN: 11 mg/dL (ref 6–23)
CALCIUM: 9.5 mg/dL (ref 8.4–10.5)
CO2: 30 meq/L (ref 19–32)
Chloride: 102 mEq/L (ref 96–112)
Creat: 0.95 mg/dL (ref 0.50–1.35)
GFR, Est Non African American: 85 mL/min
Glucose, Bld: 106 mg/dL — ABNORMAL HIGH (ref 70–99)
Potassium: 3.3 mEq/L — ABNORMAL LOW (ref 3.5–5.3)
Sodium: 141 mEq/L (ref 135–145)

## 2015-03-15 NOTE — Patient Instructions (Signed)
Your physical exam was good today. Your blood sugar and cholesterol looked good. We drew labs today to check your kidneys and electrolytes. I'll be in touch with you with those results.  Please let us know if we can do anything else for you.   Health Maintenance A healthy lifestyle and preventative care can promote health and wellness.  Maintain regular health, dental, and eye exams.  Eat a healthy diet. Foods like vegetables, fruits, whole grains, low-fat dairy products, and lean protein foods contain the nutrients you need and are low in calories. Decrease your intake of foods high in solid fats, added sugars, and salt. Get information about a proper diet from your health care provider, if necessary.  Regular physical exercise is one of the most important things you can do for your health. Most adults should get at least 150 minutes of moderate-intensity exercise (any activity that increases your heart rate and causes you to sweat) each week. In addition, most adults need muscle-strengthening exercises on 2 or more days a week.   Maintain a healthy weight. The body mass index (BMI) is a screening tool to identify possible weight problems. It provides an estimate of body fat based on height and weight. Your health care provider can find your BMI and can help you achieve or maintain a healthy weight. For males 20 years and older:  A BMI below 18.5 is considered underweight.  A BMI of 18.5 to 24.9 is normal.  A BMI of 25 to 29.9 is considered overweight.  A BMI of 30 and above is considered obese.  Maintain normal blood lipids and cholesterol by exercising and minimizing your intake of saturated fat. Eat a balanced diet with plenty of fruits and vegetables. Blood tests for lipids and cholesterol should begin at age 53 and be repeated every 5 years. If your lipid or cholesterol levels are high, you are over age 44, or you are at high risk for heart disease, you may need your cholesterol levels  checked more frequently.Ongoing high lipid and cholesterol levels should be treated with medicines if diet and exercise are not working.  If you smoke, find out from your health care provider how to quit. If you do not use tobacco, do not start.  Lung cancer screening is recommended for adults aged 73-80 years who are at high risk for developing lung cancer because of a history of smoking. A yearly low-dose CT scan of the lungs is recommended for people who have at least a 30-pack-year history of smoking and are current smokers or have quit within the past 15 years. A pack year of smoking is smoking an average of 1 pack of cigarettes a day for 1 year (for example, a 30-pack-year history of smoking could mean smoking 1 pack a day for 30 years or 2 packs a day for 15 years). Yearly screening should continue until the smoker has stopped smoking for at least 15 years. Yearly screening should be stopped for people who develop a health problem that would prevent them from having lung cancer treatment.  If you choose to drink alcohol, do not have more than 2 drinks per day. One drink is considered to be 12 oz (360 mL) of beer, 5 oz (150 mL) of wine, or 1.5 oz (45 mL) of liquor.  Avoid the use of street drugs. Do not share needles with anyone. Ask for help if you need support or instructions about stopping the use of drugs.  High blood pressure causes heart  disease and increases the risk of stroke. Blood pressure should be checked at least every 1-2 years. Ongoing high blood pressure should be treated with medicines if weight loss and exercise are not effective.  If you are 44-53 years old, ask your health care provider if you should take aspirin to prevent heart disease.  Diabetes screening involves taking a blood sample to check your fasting blood sugar level. This should be done once every 3 years after age 59 if you are at a normal weight and without risk factors for diabetes. Testing should be considered  at a younger age or be carried out more frequently if you are overweight and have at least 1 risk factor for diabetes.  Colorectal cancer can be detected and often prevented. Most routine colorectal cancer screening begins at the age of 1 and continues through age 39. However, your health care provider may recommend screening at an earlier age if you have risk factors for colon cancer. On a yearly basis, your health care provider may provide home test kits to check for hidden blood in the stool. A small camera at the end of a tube may be used to directly examine the colon (sigmoidoscopy or colonoscopy) to detect the earliest forms of colorectal cancer. Talk to your health care provider about this at age 23 when routine screening begins. A direct exam of the colon should be repeated every 5-10 years through age 35, unless early forms of precancerous polyps or small growths are found.  People who are at an increased risk for hepatitis B should be screened for this virus. You are considered at high risk for hepatitis B if:  You were born in a country where hepatitis B occurs often. Talk with your health care provider about which countries are considered high risk.  Your parents were born in a high-risk country and you have not received a shot to protect against hepatitis B (hepatitis B vaccine).  You have HIV or AIDS.  You use needles to inject street drugs.  You live with, or have sex with, someone who has hepatitis B.  You are a man who has sex with other men (MSM).  You get hemodialysis treatment.  You take certain medicines for conditions like cancer, organ transplantation, and autoimmune conditions.  Hepatitis C blood testing is recommended for all people born from [birth year] through 1964 and any individual with known risk factors for hepatitis C.  Healthy men should no longer receive prostate-specific antigen (PSA) blood tests as part of routine cancer screening. Talk to your health care  provider about prostate cancer screening.  Testicular cancer screening is not recommended for adolescents or adult males who have no symptoms. Screening includes self-exam, a health care provider exam, and other screening tests. Consult with your health care provider about any symptoms you have or any concerns you have about testicular cancer.  Practice safe sex. Use condoms and avoid high-risk sexual practices to reduce the spread of sexually transmitted infections (STIs).  You should be screened for STIs, including gonorrhea and chlamydia if:  You are sexually active and are younger than 24 years.  You are older than 24 years, and your health care provider tells you that you are at risk for this type of infection.  Your sexual activity has changed since you were last screened, and you are at an increased risk for chlamydia or gonorrhea. Ask your health care provider if you are at risk.  If you are at risk of being infected  with HIV, it is recommended that you take a prescription medicine daily to prevent HIV infection. This is called pre-exposure prophylaxis (PrEP). You are considered at risk if:  You are a man who has sex with other men (MSM).  You are a heterosexual man who is sexually active with multiple partners.  You take drugs by injection.  You are sexually active with a partner who has HIV.  Talk with your health care provider about whether you are at high risk of being infected with HIV. If you choose to begin PrEP, you should first be tested for HIV. You should then be tested every 3 months for as long as you are taking PrEP.  Use sunscreen. Apply sunscreen liberally and repeatedly throughout the day. You should seek shade when your shadow is shorter than you. Protect yourself by wearing long sleeves, pants, a wide-brimmed hat, and sunglasses year round whenever you are outdoors.  Tell your health care provider of new moles or changes in moles, especially if there is a change  in shape or color. Also, tell your health care provider if a mole is larger than the size of a pencil eraser.  A one-time screening for abdominal aortic aneurysm (AAA) and surgical repair of large AAAs by ultrasound is recommended for men aged 55-75 years who are current or former smokers.  Stay current with your vaccines (immunizations). Document Released: 06/01/2008 Document Revised: 12/09/2013 Document Reviewed: 05/01/2011 [facility] Patient Information 2015 [place], [place]. This information is not intended to replace advice given to you by your health care provider. Make sure you discuss any questions you have with your health care provider.

## 2015-03-15 NOTE — Progress Notes (Signed)
Subjective:    Patient ID: Bryce Rodriguez, male    DOB: 11-01-1951, 64 y.o.   MRN: [id number]  Chief Complaint  Patient presents with  . Annual Exam   Patient Active Problem List   Diagnosis Date Noted  . Numbness and tingling in hands 05/01/2014  . Arthritis 02/24/2014  . HTN (hypertension) 01/23/2012  . Hypercholesteremia 01/23/2012   Prior to Admission medications   Medication Sig Start Date End Date Taking? Authorizing Provider  amLODipine (NORVASC) 10 MG tablet TAKE 1 TABLET BY MOUTH EVERY DAY. 03/08/15  Yes Darlyne Russian, MD  aspirin 81 MG tablet Take 81 mg by mouth daily.   Yes Historical Provider, MD  fluticasone (FLONASE) 50 MCG/ACT nasal spray Place 2 sprays into both nostrils daily. 02/24/14  Yes Darlyne Russian, MD  losartan-hydrochlorothiazide (HYZAAR) 100-25 MG per tablet TAKE 1 TABLET BY MOUTH EVERY DAY 03/08/15  Yes Darlyne Russian, MD  potassium chloride SA (KLOR-CON M20) 20 MEQ tablet Take 1 tablet (20 mEq total) by mouth 2 (two) times daily. 10/27/14  Yes Darlyne Russian, MD  rosuvastatin (CRESTOR) 20 MG tablet TAKE 1 TABLET BY MOUTH EVERY DAY. 03/08/15  Yes Darlyne Russian, MD  meloxicam (MOBIC) 15 MG tablet Take 1 tablet (15 mg total) by mouth daily. Patient not taking: Reported on 03/15/2015 02/24/14   Darlyne Russian, MD  tamsulosin (FLOMAX) 0.4 MG CAPS capsule Take 1 capsule (0.4 mg total) by mouth daily. Patient not taking: Reported on 03/15/2015 12/08/14   Darlyne Russian, MD   Medications, allergies, past medical history, surgical history, family history, social history and problem list reviewed and updated.  HPI  35 yom with pmh htn, osteoarthritis, high cho presents for cpe.  Needs cpe for work. Had fasting BG and lipids drawn few days ago. These both came back normal. Dr Everlene Farrier is his pcp.   Denies any issues, complaints. Feels his OA is doing ok. Had cs injx couple wks ago for low back oa. No longer taking mobic. Took ibuprofen few wks ago quite a bit for low back pain  prior to the cs injx.   Had slightly low k one yr ago. Has been taking 20 meq daily, though misses some days. Denies palps, cramps.   Does not check his bp at home. Taking his bp meds as prescribed.   Exercise: No formal. 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  Diet: No specific diet. Tries to limit sodium. No alcohol. Minimal sugary drink intake.   States had tdap vaccine 3-4 yrs ago. States had colonoscopy few yrs ago and is not due for several yrs. Declines zoster vaccine.   Review of Systems Denies cp, sob, palps, presyncope, syncope, ha, vision changes, hematuria.     Objective:   Physical Exam  Constitutional: He is oriented to person, place, and time. He appears well-developed and well-nourished.  Non-toxic appearance. He does not have a sickly appearance. He does not appear ill. No distress.  BP 138/80 mmHg  Pulse 74  Temp(Src) 98.8 F (37.1 C) (Oral)  Resp 16  Ht 5' 7.2" (1.707 m)  Wt 200 lb 3.2 oz (90.81 kg)  BMI 31.16 kg/m2  SpO2 96%   HENT:  Right Ear: Tympanic membrane normal.  Left Ear: Tympanic membrane normal.  Mouth/Throat: Uvula is midline, oropharynx is clear and moist and mucous membranes are normal.  Eyes: Conjunctivae and EOM are normal. Pupils are equal, round, and reactive to light.  Neck: Normal range of motion. Carotid bruit is not present. No thyroid mass and no thyromegaly present.  Cardiovascular: Normal rate, regular rhythm and normal heart sounds.  PMI is not displaced.   Pulses:      Posterior tibial pulses are 2+ on the right side, and 2+ on the left side.  Pulmonary/Chest: Effort normal and breath sounds normal.  Abdominal: Soft. Normal appearance and bowel sounds are normal. There is no hepatosplenomegaly. There is no tenderness. There is no CVA tenderness.  Neurological: He is alert and oriented to person, place, and time. He has normal strength. No cranial nerve deficit or sensory deficit.  Normal finger to nose, rapid  alternating movements.   Psychiatric: He has a normal mood and affect. His speech is normal and behavior is normal.      Assessment & Plan:   37 yom with pmh htn, osteoarthritis, high cho presents for cpe.  Annual physical exam --normal exam, vitals today --bg, lipids normal from fasting draw --rtc as needed --no vaccine, preventative testing due today, pt declines zoster  Hypokalemia - Plan: BASIC METABOLIC PANEL WITH GFR Essential hypertension --k 3.3 one yr ago, taking replacement, recheck today --bmp as on hyzaar  Julieta Gutting, PA-C Physician Assistant-Certified [facility]  03/15/2015 4:10 PM

## 2015-04-19 ENCOUNTER — Other Ambulatory Visit: Payer: Self-pay | Admitting: Emergency Medicine

## 2015-06-08 ENCOUNTER — Ambulatory Visit: Payer: BC Managed Care – PPO | Admitting: Emergency Medicine

## 2015-08-20 ENCOUNTER — Other Ambulatory Visit: Payer: Self-pay | Admitting: Emergency Medicine

## 2015-09-21 ENCOUNTER — Encounter: Payer: Self-pay | Admitting: Emergency Medicine

## 2015-10-18 ENCOUNTER — Other Ambulatory Visit: Payer: Self-pay | Admitting: Emergency Medicine

## 2015-12-29 ENCOUNTER — Ambulatory Visit (INDEPENDENT_AMBULATORY_CARE_PROVIDER_SITE_OTHER): Payer: BLUE CROSS/BLUE SHIELD

## 2015-12-29 ENCOUNTER — Ambulatory Visit (INDEPENDENT_AMBULATORY_CARE_PROVIDER_SITE_OTHER): Payer: BLUE CROSS/BLUE SHIELD | Admitting: Emergency Medicine

## 2015-12-29 VITALS — BP 132/84 | HR 69 | Temp 97.9°F | Ht 65.25 in | Wt 197.0 lb

## 2015-12-29 DIAGNOSIS — R350 Frequency of micturition: Secondary | ICD-10-CM

## 2015-12-29 DIAGNOSIS — E785 Hyperlipidemia, unspecified: Secondary | ICD-10-CM

## 2015-12-29 DIAGNOSIS — J069 Acute upper respiratory infection, unspecified: Secondary | ICD-10-CM | POA: Diagnosis not present

## 2015-12-29 DIAGNOSIS — G44039 Episodic paroxysmal hemicrania, not intractable: Secondary | ICD-10-CM

## 2015-12-29 DIAGNOSIS — I10 Essential (primary) hypertension: Secondary | ICD-10-CM | POA: Diagnosis not present

## 2015-12-29 DIAGNOSIS — M5441 Lumbago with sciatica, right side: Secondary | ICD-10-CM

## 2015-12-29 DIAGNOSIS — R030 Elevated blood-pressure reading, without diagnosis of hypertension: Secondary | ICD-10-CM | POA: Diagnosis not present

## 2015-12-29 DIAGNOSIS — M25522 Pain in left elbow: Secondary | ICD-10-CM

## 2015-12-29 DIAGNOSIS — M5442 Lumbago with sciatica, left side: Secondary | ICD-10-CM

## 2015-12-29 DIAGNOSIS — M7022 Olecranon bursitis, left elbow: Secondary | ICD-10-CM

## 2015-12-29 DIAGNOSIS — IMO0001 Reserved for inherently not codable concepts without codable children: Secondary | ICD-10-CM

## 2015-12-29 LAB — POCT URINALYSIS DIP (MANUAL ENTRY)
Bilirubin, UA: NEGATIVE
Blood, UA: NEGATIVE
GLUCOSE UA: NEGATIVE
Ketones, POC UA: NEGATIVE
Leukocytes, UA: NEGATIVE
NITRITE UA: NEGATIVE
Protein Ur, POC: NEGATIVE
Spec Grav, UA: 1.015
UROBILINOGEN UA: 0.2
pH, UA: 7

## 2015-12-29 LAB — COMPLETE METABOLIC PANEL WITH GFR
ALT: 34 U/L (ref 9–46)
AST: 20 U/L (ref 10–35)
Albumin: 4.5 g/dL (ref 3.6–5.1)
Alkaline Phosphatase: 47 U/L (ref 40–115)
BUN: 11 mg/dL (ref 7–25)
CO2: 31 mmol/L (ref 20–31)
Calcium: 9.8 mg/dL (ref 8.6–10.3)
Chloride: 102 mmol/L (ref 98–110)
Creat: 1.04 mg/dL (ref 0.70–1.25)
GFR, EST NON AFRICAN AMERICAN: 76 mL/min (ref >=60)
GFR, Est African American: 88 mL/min (ref >=60)
GLUCOSE: 111 mg/dL — AB (ref 65–99)
POTASSIUM: 3.7 mmol/L (ref 3.5–5.3)
SODIUM: 142 mmol/L (ref 135–146)
TOTAL PROTEIN: 7 g/dL (ref 6.1–8.1)
Total Bilirubin: 0.6 mg/dL (ref 0.2–1.2)

## 2015-12-29 LAB — POCT CBC
Granulocyte percent: 65.8 %G (ref 37–80)
HCT, POC: 43.8 % (ref 43.5–53.7)
Hemoglobin: 14.9 g/dL (ref 14.1–18.1)
Lymph, poc: 1.9 (ref 0.6–3.4)
MCH, POC: 28.5 pg (ref 27–31.2)
MCHC: 34.1 g/dL (ref 31.8–35.4)
MCV: 83.5 fL (ref 80–97)
MID (cbc): 0.3 (ref 0–0.9)
MPV: 7.1 fL (ref 0–99.8)
PLATELET COUNT, POC: 273 10*3/uL (ref 142–424)
POC Granulocyte: 4.3 (ref 2–6.9)
POC LYMPH %: 29.4 % (ref 10–50)
POC MID %: 4.8 %M (ref 0–12)
RBC: 5.24 M/uL (ref 4.69–6.13)
RDW, POC: 14.4 %
WBC: 6.5 10*3/uL (ref 4.6–10.2)

## 2015-12-29 LAB — POC MICROSCOPIC URINALYSIS (UMFC): MUCUS RE: ABSENT

## 2015-12-29 MED ORDER — TAMSULOSIN HCL 0.4 MG PO CAPS: 0.4000 mg | ORAL_CAPSULE | Freq: Every day | ORAL | Status: DC

## 2015-12-29 MED ORDER — ROSUVASTATIN CALCIUM 20 MG PO TABS: ORAL_TABLET | ORAL | Status: DC

## 2015-12-29 MED ORDER — LOSARTAN POTASSIUM-HCTZ 100-25 MG PO TABS: ORAL_TABLET | ORAL | Status: DC

## 2015-12-29 MED ORDER — POTASSIUM CHLORIDE CRYS ER 20 MEQ PO TBCR: 20.0000 meq | EXTENDED_RELEASE_TABLET | Freq: Two times a day (BID) | ORAL | Status: DC

## 2015-12-29 MED ORDER — MELOXICAM 15 MG PO TABS: 15.0000 mg | ORAL_TABLET | Freq: Every day | ORAL | Status: DC

## 2015-12-29 MED ORDER — AMLODIPINE BESYLATE 10 MG PO TABS: ORAL_TABLET | ORAL | Status: DC

## 2015-12-29 MED ORDER — FLUTICASONE PROPIONATE 50 MCG/ACT NA SUSP: 2.0000 | Freq: Every day | NASAL | Status: DC

## 2015-12-29 NOTE — Progress Notes (Addendum)
Patient ID: Bryce Rodriguez, male   DOB: [birth year], 65 y.o.   MRN: [id number]    By signing my name below, I, Essence Howell, attest that this documentation has been prepared under the direction and in the presence of Darlyne Russian, MD Electronically Signed: Ladene Artist, ED Scribe 12/29/2015 at 11:44 AM.  Chief Complaint:  Chief Complaint  Patient presents with  . Sinusitis    L/sided facial-head pain x 2 weeks worse  . Back Pain    low back soreness radiates into legs - worse x 1 month  . Joint Swelling    L/elbow - olecrannon swelling x 3 weeks  . Urinary Urgency    hard to control after urge hits him  . Hypertension    elevated at triage   HPI: Bryce Rodriguez is a 65 y.o. male who reports to [facility] today complaining of gradually worsening, constant left-sided HA for the past 2 weeks. Pt reports similar HAs in the past, however, they do not typically last as long. He reports associated sinus pressure, nasal congestion and clear rhinorrhea. Pt has tried Excedrin without significant relief.   Back Pain Pt reports gradually worsening, constant low back pain radiating into bilateral extremities onset 1 month ago. Pt describes back pain as soreness. Pt has been followed by Vivianne Master and has had back injections.   Joint Swelling Pt reports left elbow swelling for the past 3 weeks. He reports constant pain to the area that he describes as tenderness. Pt reports using a computer daily at work.   Urinary Urgency Pt reports urinary urgency for a while, worsened over the past few weeks. Although prescribed Flomax, pt states that he has not taken the medication. Pt's last  PSA was 1.94 on 12/08/14.   Pt plans to retire in March 2018.   Past Medical History  Diagnosis Date  . HTN (hypertension)   . HLD (hyperlipidemia)    Past Surgical History  Procedure Laterality Date  . None    . Neck surgery  May 19 2014   Social History   Social History  . Marital Status: Married    Spouse Name: N/A  . Number of Children: 2  . Years of Education: N/A   Occupational History  . 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 Smoking status: Never Smoker   . Smokeless tobacco: Current User    Types: Snuff  . Alcohol Use: 1.2 oz/week    2 Standard drinks or equivalent per week  . Drug Use: No  . Sexual Activity: Not Asked   Other Topics Concern  . None   Social History Narrative   Family History  Problem Relation Age of Onset  . Cancer    . Hypertension     No Known Allergies Prior to Admission medications   Medication Sig Start Date End Date Taking? Authorizing Provider  amLODipine (NORVASC) 10 MG tablet TAKE 1 BY MOUTH DAILY  " NO MORE REFILLS WITHOUT OV" 08/23/15  Yes Chelle Jeffery, PA-C  aspirin 81 MG tablet Take 81 mg by mouth daily.   Yes Historical Provider, MD  fluticasone (FLONASE) 50 MCG/ACT nasal spray Place 2 sprays into both nostrils daily. 02/24/14  Yes Darlyne Russian, MD  losartan-hydrochlorothiazide (HYZAAR) 100-25 MG per tablet TAKE 1 BY MOUTH DAILY  "NO MORE REFILLS WITHOUT OV 08/23/15  Yes Chelle Jeffery, PA-C  potassium chloride SA (KLOR-CON M20) 20 MEQ tablet Take 1 tablet (20 mEq total) by mouth 2 (  two) times daily. 10/27/14  Yes Darlyne Russian, MD  rosuvastatin (CRESTOR) 20 MG tablet TAKE 1 BY MOUTH DAILY  "NO MORE REFILLS WITHOUT OV" 08/23/15  Yes Chelle Jeffery, PA-C  meloxicam (MOBIC) 15 MG tablet Take 1 tablet (15 mg total) by mouth daily. Patient not taking: Reported on 03/15/2015 02/24/14   Darlyne Russian, MD  tamsulosin (FLOMAX) 0.4 MG CAPS capsule Take 1 capsule (0.4 mg total) by mouth daily. Patient not taking: Reported on 12/29/2015 12/08/14   Darlyne Russian, MD   ROS: The patient denies fevers, chills, night sweats, unintentional weight loss, chest pain, palpitations, wheezing, dyspnea on exertion, nausea, vomiting, abdominal pain, dysuria, hematuria, melena, numbness, weakness, or tingling. +sinus pressure, +rhinorrhea,  +congestion, +back pain, +joint swelling, +urinary urgency, +HA  All other systems have been reviewed and were otherwise negative with the exception of those mentioned in the HPI and as above.    PHYSICAL EXAM: Filed Vitals:   12/29/15 1053  BP: 145/88  Pulse: 69  Temp: 97.9 F (36.6 C)   Body mass index is 32.55 kg/(m^2).  General: Alert, no acute distress HEENT:  Normocephalic, atraumatic, oropharynx patent. No tenderness over the maxillary sinuses or L temporal artery.  Eye: Juliette Mangle [facility] Cardiovascular:  Regular rate and rhythm, no rubs murmurs or gallops. No Carotid bruits, radial pulse intact. No pedal edema.  Respiratory: Clear to auscultation bilaterally. No wheezes, rales, or rhonchi. No cyanosis, no use of accessory musculature Abdominal: No organomegaly, abdomen is soft and non-tender, positive bowel sounds. No masses. Musculoskeletal: Gait intact. No edema. 4 cm L olecranon bursitis. No tenderness over the lower back. Reflex 3+, strength 5/5. GU: Prostate is normal size without nodules.  Skin: No rashes. Neurologic: Facial musculature symmetric. Psychiatric: Patient acts appropriately throughout our interaction. Lymphatic: No cervical or submandibular lymphadenopathy PROCEDURE NOTE The left olecranon area was prepped with Betadine 2 swab with alcohol numbed with 1 mL of 2% plain and 8 mL of blood-tinged straw-colored fluid was removed. Patient tolerated well. Meds ordered this encounter  Medications  . DISCONTD: tamsulosin (FLOMAX) 0.4 MG CAPS capsule    Sig: Take 1 capsule (0.4 mg total) by mouth daily.    Dispense:  90 capsule    Refill:  3  . rosuvastatin (CRESTOR) 20 MG tablet    Sig: TAKE 1 BY MOUTH DAILY    Dispense:  90 tablet    Refill:  3  . DISCONTD: potassium chloride SA (KLOR-CON M20) 20 MEQ tablet    Sig: Take 1 tablet (20 mEq total) by mouth 2 (two) times daily.    Dispense:  180 tablet    Refill:  3  . amLODipine (NORVASC) 10 MG tablet    Sig:  TAKE 1 BY MOUTH DAILY    Dispense:  90 tablet    Refill:  3  . DISCONTD: losartan-hydrochlorothiazide (HYZAAR) 100-25 MG tablet    Sig: TAKE 1 BY MOUTH DAILY    Dispense:  90 tablet    Refill:  3  . tamsulosin (FLOMAX) 0.4 MG CAPS capsule    Sig: Take 1 capsule (0.4 mg total) by mouth daily.    Dispense:  10 capsule    Refill:  0  . potassium chloride SA (KLOR-CON M20) 20 MEQ tablet    Sig: Take 1 tablet (20 mEq total) by mouth 2 (two) times daily.    Dispense:  10 tablet    Refill:  0  . losartan-hydrochlorothiazide (HYZAAR) 100-25 MG tablet    Sig: TAKE  1 BY MOUTH DAILY    Dispense:  10 tablet    Refill:  0  . fluticasone (FLONASE) 50 MCG/ACT nasal spray    Sig: Place 2 sprays into both nostrils daily.    Dispense:  48 g    Refill:  1  . meloxicam (MOBIC) 15 MG tablet    Sig: Take 1 tablet (15 mg total) by mouth daily.    Dispense:  14 tablet    Refill:  0    LABS:  EKG/XRAY:   Primary read interpreted by Dr. Everlene Farrier at [facility]. Sinus films show no evidence of sinusitis. Elbow films show a small olecranon spur and soft tissue swelling otherwise negative.  ASSESSMENT/PLAN: Meds were refilled today. They were sent to his mail order as well as short-term to his pharmacy. He will keep the left olecranon prepped. The fluid was sent for analysis. Routine labs were also done today. Not clear to me the source of his headache. I did a sedimentation rate to be sure we were not dealing with temporal arteritis. In the interim he will be on meloxicam for pain.I personally performed the services described in this documentation, which was scribed in my presence. The recorded information has been reviewed and is accurate.    Gross sideeffects, risk and benefits, and alternatives of medications d/w patient. Patient is aware that all medications have potential sideeffects and we are unable to predict every sideeffect or drug-drug interaction that may occur.  Arlyss Queen MD 12/29/2015 11:23  AM

## 2015-12-30 LAB — SYNOVIAL CELL COUNT + DIFF, W/ CRYSTALS
Crystals, Fluid: NONE SEEN
EOSINOPHILS-SYNOVIAL: 0 % (ref 0–1)
Lymphocytes-Synovial Fld: 43 % — ABNORMAL HIGH (ref 0–20)
Monocyte/Macrophage: 54 % (ref 50–90)
Neutrophil, Synovial: 3 % (ref 0–25)
WBC, Synovial: 160 cu mm (ref 0–200)

## 2015-12-30 LAB — PSA: PSA: 1.76 ng/mL (ref <=4.00)

## 2015-12-30 LAB — SEDIMENTATION RATE: SED RATE: 7 mm/h (ref 0–20)

## 2016-01-02 LAB — BODY FLUID CULTURE
Gram Stain: NONE SEEN
Organism ID, Bacteria: NO GROWTH

## 2016-01-06 ENCOUNTER — Telehealth: Payer: Self-pay

## 2016-01-06 NOTE — Telephone Encounter (Signed)
Needs report for his scan called back to him back. [id number]

## 2016-01-07 NOTE — Telephone Encounter (Signed)
Called pt and advised message from provider on their voicemail.  

## 2016-01-07 NOTE — Telephone Encounter (Signed)
  Gram Stain Few   Gram Stain WBC present-predominately PMN   Gram Stain No Organisms Seen   Organism ID, Bacteria NO GROWTH 3 DAYS   Resulting Agency [facility]    Narrative     Performed at: [address], [id number]        [place], [place] [address]       Dr. Everlene Farrier, pt states his elbow is getting bigger and may need fluid taken off again. I told him to RTC but wants to see what you say. I told him you were here this weekend.

## 2016-01-07 NOTE — Telephone Encounter (Signed)
The fluid tends to recur. Does he want to see me this weekend and I can draw it off or does he want me to refer him to an orthopedist.

## 2016-01-08 ENCOUNTER — Ambulatory Visit (INDEPENDENT_AMBULATORY_CARE_PROVIDER_SITE_OTHER): Payer: BLUE CROSS/BLUE SHIELD | Admitting: Emergency Medicine

## 2016-01-08 VITALS — BP 130/84 | HR 65 | Temp 98.1°F | Resp 18 | Ht 66.75 in | Wt 201.2 lb

## 2016-01-08 DIAGNOSIS — M7022 Olecranon bursitis, left elbow: Secondary | ICD-10-CM | POA: Diagnosis not present

## 2016-01-08 NOTE — Progress Notes (Signed)
By signing my name below, I, Moises Blood, attest that this documentation has been prepared under the direction and in the presence of Arlyss Queen, MD. Electronically Signed: Moises Blood, [place]. 01/08/2016 , 9:41 AM .  Patient was seen in room 8 .  Chief Complaint:  Chief Complaint  Patient presents with  . Follow-up    for left elbow fluid drainage    HPI: Bryce Rodriguez is a 65 y.o. male who reports to [facility] today for follow up on left elbow fluid drainage. Patient is here for follow up of olecranon bursitis. He was seen 12/29/15 and fluid was aspirated. There was no evidence of gout and culture negative. He returns today to have reaspirated.   Past Medical History  Diagnosis Date  . HTN (hypertension)   . HLD (hyperlipidemia)    Past Surgical History  Procedure Laterality Date  . None    . Neck surgery  May 19 2014   Social History   Social History  . Marital Status: Married    Spouse Name: N/A  . Number of Children: 2  . Years of Education: N/A   Occupational History  . 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 Smoking status: Never Smoker   . Smokeless tobacco: Current User    Types: Snuff  . Alcohol Use: 1.2 oz/week    2 Standard drinks or equivalent per week  . Drug Use: No  . Sexual Activity: Not Asked   Other Topics Concern  . None   Social History Narrative   Family History  Problem Relation Age of Onset  . Cancer    . Hypertension     No Known Allergies Prior to Admission medications   Medication Sig Start Date End Date Taking? Authorizing Provider  amLODipine (NORVASC) 10 MG tablet TAKE 1 BY MOUTH DAILY 12/29/15   Darlyne Russian, MD  aspirin 81 MG tablet Take 81 mg by mouth daily.    Historical Provider, MD  fluticasone (FLONASE) 50 MCG/ACT nasal spray Place 2 sprays into both nostrils daily. 12/29/15   Darlyne Russian, MD  losartan-hydrochlorothiazide (HYZAAR) 100-25 MG tablet TAKE 1 BY MOUTH DAILY 12/29/15   Darlyne Russian, MD  meloxicam (MOBIC) 15 MG tablet Take 1 tablet (15 mg total) by mouth daily. 12/29/15   Darlyne Russian, MD  potassium chloride SA (KLOR-CON M20) 20 MEQ tablet Take 1 tablet (20 mEq total) by mouth 2 (two) times daily. 12/29/15   Darlyne Russian, MD  rosuvastatin (CRESTOR) 20 MG tablet TAKE 1 BY MOUTH DAILY 12/29/15   Darlyne Russian, MD  tamsulosin (FLOMAX) 0.4 MG CAPS capsule Take 1 capsule (0.4 mg total) by mouth daily. 12/29/15   Darlyne Russian, MD     ROS:  Constitutional: negative for fever, chills, night sweats, weight changes, or fatigue  HEENT: negative for vision changes, hearing loss, congestion, rhinorrhea, ST, epistaxis, or sinus pressure Cardiovascular: negative for chest pain or palpitations Respiratory: negative for hemoptysis, wheezing, shortness of breath, or cough Abdominal: negative for abdominal pain, nausea, vomiting, diarrhea, or constipation Dermatological: negative for rash Neurologic: negative for headache, dizziness, or syncope Musc: positive for swollen elbow All other systems reviewed and are otherwise negative with the exception to those above and in the HPI.  PHYSICAL EXAM: Filed Vitals:   01/08/16 0927  BP: 130/84  Pulse: 65  Temp: 98.1 F (36.7 C)  Resp: 18   Body mass index is 31.77 kg/(m^2).  General: Alert, no acute distress HEENT:  Normocephalic, atraumatic, oropharynx patent. Eye: Juliette Mangle [facility] Cardiovascular:  Regular rate and rhythm, no rubs murmurs or gallops.  No Carotid bruits, radial pulse intact. No pedal edema.  Respiratory: Clear to auscultation bilaterally.  No wheezes, rales, or rhonchi.  No cyanosis, no use of accessory musculature Abdominal: No organomegaly, abdomen is soft and non-tender, positive bowel sounds. No masses. Musculoskeletal: Gait intact. No edema, tenderness; multiple scars over left arm, a 4x3cm swelling over the olecranon bursa Skin: No rashes. Neurologic: Facial musculature symmetric. Psychiatric: Patient acts  appropriately throughout our interaction.  Lymphatic: No cervical or submandibular lymphadenopathy Genitourinary/Anorectal: No acute findings PROCEDURE NOTE Olecranon prepped with Betadine 2 swabbed with alcohol. Cc of 2% injected followed by aspiration of 10 mL of serosanguineous fluid. Patient tolerated well. LABS:   EKG/XRAY:   Primary read interpreted by Dr. Everlene Farrier at [facility].   ASSESSMENT/PLAN: Patient has olecranon spur. 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 He will keep the area wrapped with an Ace wrap and try and avoid pressure to the area. If he continues to have problems he will call and I will refer him to orthopedics.I personally performed the services described in this documentation, which was scribed in my presence. The recorded information has been reviewed and is accurate.   Gross sideeffects, risk and benefits, and alternatives of medications d/w patient. Patient is aware that all medications have potential sideeffects and we are unable to predict every sideeffect or drug-drug interaction that may occur.  Arlyss Queen MD 01/08/2016 9:41 AM

## 2016-01-10 LAB — WOUND CULTURE
GRAM STAIN: NONE SEEN
Organism ID, Bacteria: NO GROWTH

## 2016-05-08 ENCOUNTER — Other Ambulatory Visit: Payer: Self-pay | Admitting: *Deleted

## 2016-05-08 DIAGNOSIS — R030 Elevated blood-pressure reading, without diagnosis of hypertension: Secondary | ICD-10-CM

## 2016-05-08 DIAGNOSIS — E785 Hyperlipidemia, unspecified: Secondary | ICD-10-CM

## 2016-05-08 DIAGNOSIS — I10 Essential (primary) hypertension: Secondary | ICD-10-CM

## 2016-05-08 DIAGNOSIS — IMO0001 Reserved for inherently not codable concepts without codable children: Secondary | ICD-10-CM

## 2016-05-08 MED ORDER — LOSARTAN POTASSIUM-HCTZ 100-25 MG PO TABS: ORAL_TABLET | ORAL | Status: DC

## 2016-08-25 ENCOUNTER — Encounter: Payer: Self-pay | Admitting: Urgent Care

## 2016-08-25 ENCOUNTER — Ambulatory Visit (INDEPENDENT_AMBULATORY_CARE_PROVIDER_SITE_OTHER): Payer: BLUE CROSS/BLUE SHIELD | Admitting: Urgent Care

## 2016-08-25 VITALS — BP 116/76 | HR 70 | Temp 98.4°F | Resp 16 | Ht 65.0 in | Wt 191.6 lb

## 2016-08-25 DIAGNOSIS — I10 Essential (primary) hypertension: Secondary | ICD-10-CM | POA: Diagnosis not present

## 2016-08-25 DIAGNOSIS — E785 Hyperlipidemia, unspecified: Secondary | ICD-10-CM | POA: Diagnosis not present

## 2016-08-25 DIAGNOSIS — N400 Enlarged prostate without lower urinary tract symptoms: Secondary | ICD-10-CM

## 2016-08-25 DIAGNOSIS — R350 Frequency of micturition: Secondary | ICD-10-CM | POA: Diagnosis not present

## 2016-08-25 LAB — COMPLETE METABOLIC PANEL WITH GFR
ALT: 40 U/L (ref 9–46)
AST: 20 U/L (ref 10–35)
Albumin: 4.7 g/dL (ref 3.6–5.1)
Alkaline Phosphatase: 45 U/L (ref 40–115)
BILIRUBIN TOTAL: 0.4 mg/dL (ref 0.2–1.2)
BUN: 16 mg/dL (ref 7–25)
CO2: 28 mmol/L (ref 20–31)
Calcium: 9.4 mg/dL (ref 8.6–10.3)
Chloride: 106 mmol/L (ref 98–110)
Creat: 1.04 mg/dL (ref 0.70–1.25)
GFR, EST NON AFRICAN AMERICAN: 76 mL/min (ref >=60)
GFR, Est African American: 88 mL/min (ref >=60)
GLUCOSE: 103 mg/dL — AB (ref 65–99)
Potassium: 4.1 mmol/L (ref 3.5–5.3)
SODIUM: 142 mmol/L (ref 135–146)
Total Protein: 7.2 g/dL (ref 6.1–8.1)

## 2016-08-25 LAB — LIPID PANEL
Cholesterol: 156 mg/dL (ref 125–200)
HDL: 56 mg/dL (ref >=40)
LDL CALC: 82 mg/dL (ref <130)
Total CHOL/HDL Ratio: 2.8 Ratio (ref <=5.0)
Triglycerides: 88 mg/dL (ref <150)
VLDL: 18 mg/dL (ref <30)

## 2016-08-25 LAB — MICROALBUMIN, URINE: Microalb, Ur: 1.2 mg/dL

## 2016-08-25 MED ORDER — ROSUVASTATIN CALCIUM 20 MG PO TABS: ORAL_TABLET | ORAL | 3 refills | Status: DC

## 2016-08-25 MED ORDER — AMLODIPINE BESYLATE 10 MG PO TABS: ORAL_TABLET | ORAL | 3 refills | Status: DC

## 2016-08-25 MED ORDER — LOSARTAN POTASSIUM-HCTZ 100-25 MG PO TABS: ORAL_TABLET | ORAL | 3 refills | Status: DC

## 2016-08-25 MED ORDER — LOSARTAN POTASSIUM-HCTZ 100-25 MG PO TABS: ORAL_TABLET | ORAL | 0 refills | Status: DC

## 2016-08-25 MED ORDER — TAMSULOSIN HCL 0.4 MG PO CAPS: 0.4000 mg | ORAL_CAPSULE | Freq: Every day | ORAL | 5 refills | Status: DC

## 2016-08-25 MED ORDER — AMLODIPINE BESYLATE 10 MG PO TABS: ORAL_TABLET | ORAL | 0 refills | Status: DC

## 2016-08-25 NOTE — Patient Instructions (Addendum)
Hypertension Hypertension, commonly called high blood pressure, is when the force of blood pumping through your arteries is too strong. Your arteries are the blood vessels that carry blood from your heart throughout your body. A blood pressure reading consists of a higher number over a lower number, such as 110/72. The higher number (systolic) is the pressure inside your arteries when your heart pumps. The lower number (diastolic) is the pressure inside your arteries when your heart relaxes. Ideally you want your blood pressure below 120/80. Hypertension forces your heart to work harder to pump blood. Your arteries may become narrow or stiff. Having untreated or uncontrolled hypertension can cause heart attack, stroke, kidney disease, and other problems. RISK FACTORS Some risk factors for high blood pressure are controllable. Others are not.  Risk factors you cannot control include:   Race. You may be at higher risk if you are African American.  Age. Risk increases with age.  Gender. Men are at higher risk than women before age 45 years. After age 65, women are at higher risk than men. Risk factors you can control include:  Not getting enough exercise or physical activity.  Being overweight.  Getting too much fat, sugar, calories, or salt in your diet.  Drinking too much alcohol. SIGNS AND SYMPTOMS Hypertension does not usually cause signs or symptoms. Extremely high blood pressure (hypertensive crisis) may cause headache, anxiety, shortness of breath, and nosebleed. DIAGNOSIS To check if you have hypertension, your health care provider will measure your blood pressure while you are seated, with your arm held at the level of your heart. It should be measured at least twice using the same arm. Certain conditions can cause a difference in blood pressure between your right and left arms. A blood pressure reading that is higher than normal on one occasion does not mean that you need treatment. If  it is not clear whether you have high blood pressure, you may be asked to return on a different day to have your blood pressure checked again. Or, you may be asked to monitor your blood pressure at home for 1 or more weeks. TREATMENT Treating high blood pressure includes making lifestyle changes and possibly taking medicine. Living a healthy lifestyle can help lower high blood pressure. You may need to change some of your habits. Lifestyle changes may include:  Following the DASH diet. This diet is high in fruits, vegetables, and whole grains. It is low in salt, red meat, and added sugars.  Keep your sodium intake below 2,300 mg per day.  Getting at least 30-45 minutes of aerobic exercise at least 4 times per week.  Losing weight if necessary.  Not smoking.  Limiting alcoholic beverages.  Learning ways to reduce stress. Your health care provider may prescribe medicine if lifestyle changes are not enough to get your blood pressure under control, and if one of the following is true:  You are 18-59 years of age and your systolic blood pressure is above 140.  You are 60 years of age or older, and your systolic blood pressure is above 150.  Your diastolic blood pressure is above 90.  You have diabetes, and your systolic blood pressure is over 140 or your diastolic blood pressure is over 90.  You have kidney disease and your blood pressure is above 140/90.  You have heart disease and your blood pressure is above 140/90. Your personal target blood pressure may vary depending on your medical conditions, your age, and other factors. HOME CARE INSTRUCTIONS    Have your blood pressure rechecked as directed by your health care provider.   Take medicines only as directed by your health care provider. Follow the directions carefully. Blood pressure medicines must be taken as prescribed. The medicine does not work as well when you skip doses. Skipping doses also puts you at risk for  problems.  Do not smoke.   Monitor your blood pressure at home as directed by your health care provider. SEEK MEDICAL CARE IF:   You think you are having a reaction to medicines taken.  You have recurrent headaches or feel dizzy.  You have swelling in your ankles.  You have trouble with your vision. SEEK IMMEDIATE MEDICAL CARE IF:  You develop a severe headache or confusion.  You have unusual weakness, numbness, or feel faint.  You have severe chest or abdominal pain.  You vomit repeatedly.  You have trouble breathing. MAKE SURE YOU:   Understand these instructions.  Will watch your condition.  Will get help right away if you are not doing well or get worse.   This information is not intended to replace advice given to you by your health care provider. Make sure you discuss any questions you have with your health care provider.   Document Released: 12/04/2005 Document Revised: 04/20/2015 Document Reviewed: 09/26/2013 Elsevier Interactive Patient Education 2016 Elsevier Inc.    Cholesterol Cholesterol is a white, waxy, fat-like substance needed by your body in small amounts. The liver makes all the cholesterol you need. Cholesterol is carried from the liver by the blood through the blood vessels. Deposits of cholesterol (plaque) may build up on blood vessel walls. These make the arteries narrower and stiffer. Cholesterol plaques increase the risk for heart attack and stroke.  You cannot feel your cholesterol level even if it is very high. The only way to know it is high is with a blood test. Once you know your cholesterol levels, you should keep a record of the test results. Work with your health care provider to keep your levels in the desired range.  WHAT DO THE RESULTS MEAN?  Total cholesterol is a rough measure of all the cholesterol in your blood.   LDL is the so-called bad cholesterol. This is the type that deposits cholesterol in the walls of the arteries. You  want this level to be low.   HDL is the good cholesterol because it cleans the arteries and carries the LDL away. You want this level to be high.  Triglycerides are fat that the body can either burn for energy or store. High levels are closely linked to heart disease.  WHAT ARE THE DESIRED LEVELS OF CHOLESTEROL?  Total cholesterol below 200.   LDL below 100 for people at risk, below 70 for those at very high risk.   HDL above 50 is good, above 60 is best.   Triglycerides below 150.  HOW CAN I LOWER MY CHOLESTEROL?  Diet. Follow your diet programs as directed by your health care provider.   Choose fish or white meat chicken and [place], roasted or baked. Limit fatty cuts of red meat, fried foods, and processed meats, such as sausage and lunch meats.   Eat lots of fresh fruits and vegetables.  Choose whole grains, beans, pasta, potatoes, and cereals.   Use only small amounts of olive, corn, or canola oils.   Avoid butter, mayonnaise, shortening, or palm kernel oils.  Avoid foods with trans fats.   Drink skim or nonfat milk and eat low-fat or  nonfat yogurt and cheeses. Avoid whole milk, cream, ice cream, egg yolks, and full-fat cheeses.   Healthy desserts include angel food cake, ginger snaps, animal crackers, hard candy, popsicles, and low-fat or nonfat frozen yogurt. Avoid pastries, cakes, pies, and cookies.   Exercise. Follow your exercise programs as directed by your health care provider.   A regular program helps decrease LDL and raise HDL.   A regular program helps with weight control.   Do things that increase your activity level like gardening, walking, or taking the stairs. Ask your health care provider about how you can be more active in your daily life.   Medicine. Take medicine only as directed by your health care provider.   Medicine may be prescribed by your health care provider to help lower cholesterol and decrease the risk for heart disease.    If you have several risk factors, you may need medicine even if your levels are normal.   This information is not intended to replace advice given to you by your health care provider. Make sure you discuss any questions you have with your health care provider.   Document Released: 08/29/2001 Document Revised: 12/25/2014 Document Reviewed: 09/17/2013 Elsevier Interactive Patient Education 2016 Elsevier Inc.    Benign Prostatic Hyperplasia An enlarged prostate (benign prostatic hyperplasia) is common in older men. You may experience the following:  Weak urine stream.  Dribbling.  Feeling like the bladder has not emptied completely.  Difficulty starting urination.  Getting up frequently at night to urinate.  Urinating more frequently during the day. HOME CARE INSTRUCTIONS  Monitor your prostatic hyperplasia for any changes. The following actions may help to alleviate any discomfort you are experiencing:  Give yourself time when you urinate.  Stay away from alcohol.  Avoid beverages containing caffeine, such as coffee, tea, and colas, because they can make the problem worse.  Avoid decongestants, antihistamines, and some prescription medicines that can make the problem worse.  Follow up with your health care provider for further treatment as recommended. SEEK MEDICAL CARE IF:  You are experiencing progressive difficulty voiding.  Your urine stream is progressively getting narrower.  You are awaking from sleep with the urge to void more frequently.  You are constantly feeling the need to void.  You experience loss of urine, especially in small amounts. SEEK IMMEDIATE MEDICAL CARE IF:   You develop increased pain with urination or are unable to urinate.  You develop severe abdominal pain, vomiting, a high fever, or fainting.  You develop back pain or blood in your urine. MAKE SURE YOU:   Understand these instructions.  Will watch your condition.  Will get  help right away if you are not doing well or get worse.   This information is not intended to replace advice given to you by your health care provider. Make sure you discuss any questions you have with your health care provider.   Document Released: 12/04/2005 Document Revised: 12/25/2014 Document Reviewed: 05/06/2013 Elsevier Interactive Patient Education 2016 [employer].    IF you received an x-ray today, you will receive an invoice from [facility]. Please contact [facility] at [phone] with questions or concerns regarding your invoice.   IF you received labwork today, you will receive an invoice from [facility]. Please contact [facility] at [phone] with questions or concerns regarding your invoice.   Our billing staff will not be able to assist you with questions regarding bills from these companies.  You will be contacted with  the lab results as soon as they are available. The fastest way to get your results is to activate your My Chart account. Instructions are located on the last page of this paperwork. If you have not heard from [place] regarding the results in 2 weeks, please contact this office.

## 2016-08-25 NOTE — Progress Notes (Signed)
    MRN: [id number] DOB: 1951-07-14  Subjective:   Bryce Rodriguez is a 65 y.o. male presenting for chief complaint of Medication Refill (NORVASC, HYZAAR, CRESTOR, K-DUR, AND FLOMAX )  HTN - Managed well with amlodipine, losartan-HCTZ. Denies dizziness, chronic headache, blurred vision, chest pain, shortness of breath, heart racing, palpitations, nausea, vomiting, abdominal pain, hematuria, lower leg swelling. Denies smoking cigarettes.   HL - Managed well with Crestor. Denies confusion, body aches. ROS as above.   BPH - Managed with Flomax. Patient had post-mic dribble until he started using Flomax. Would like a refill of this today.  [place] has a current medication list which includes the following prescription(s): amlodipine, aspirin, fluticasone, losartan-hydrochlorothiazide, meloxicam, potassium chloride sa, rosuvastatin, and tamsulosin. Also has No Known Allergies.  Bryce Rodriguez  has a past medical history of HLD (hyperlipidemia) and HTN (hypertension). Also  has a past surgical history that includes none and Neck surgery (May 19 2014).  Objective:   Vitals: BP 116/76 (BP Location: Left Arm, Patient Position: Sitting, Cuff Size: Large)   Pulse 70   Temp 98.4 F (36.9 C) (Oral)   Resp 16   Ht 5\' 5"  (1.651 m)   Wt 191 lb 9.6 oz (86.9 kg)   SpO2 97%   BMI 31.88 kg/m   Wt Readings from Last 3 Encounters:  08/25/16 191 lb 9.6 oz (86.9 kg)  01/08/16 201 lb 3.2 oz (91.3 kg)  12/29/15 197 lb (89.4 kg)    Physical Exam  Constitutional: He is oriented to person, place, and time. He appears well-developed and well-nourished.  HENT:  Mouth/Throat: Oropharynx is clear and moist.  Eyes: No scleral icterus.  Neck: Normal range of motion. Neck supple. No thyromegaly present.  Cardiovascular: Normal rate, regular rhythm and intact distal pulses.  Exam reveals no gallop and no friction rub.   No murmur heard. Pulmonary/Chest: No respiratory distress. He has no wheezes. He has no rales.    Abdominal: Soft. Bowel sounds are normal. He exhibits no distension and no mass. There is no tenderness.  Musculoskeletal: He exhibits no edema.  Neurological: He is alert and oriented to person, place, and time.  Skin: Skin is warm and dry.   Assessment and Plan :   1. Essential hypertension - Well controlled, labs pending. Refilled Hyzaar, amlodipine.  2. Hyperlipidemia - Stable, refilled Crestor  3. BPH (benign prostatic hyperplasia) 4. Urinary frequency - Stable, refilled Flomax.  Jaynee Eagles, PA-C [facility] [phone] 08/25/2016 11:08 AM

## 2016-08-29 ENCOUNTER — Encounter: Payer: Self-pay | Admitting: Urgent Care

## 2016-12-15 ENCOUNTER — Ambulatory Visit (INDEPENDENT_AMBULATORY_CARE_PROVIDER_SITE_OTHER): Payer: BLUE CROSS/BLUE SHIELD | Admitting: Family Medicine

## 2016-12-15 VITALS — BP 140/90 | HR 78 | Temp 98.1°F | Resp 18 | Ht 65.0 in | Wt 201.0 lb

## 2016-12-15 DIAGNOSIS — A64 Unspecified sexually transmitted disease: Secondary | ICD-10-CM

## 2016-12-15 NOTE — Patient Instructions (Signed)
     IF you received an x-ray today, you will receive an invoice from [facility]. Please contact  Radiology at [phone] with questions or concerns regarding your invoice.   IF you received labwork today, you will receive an invoice from [facility]. Please contact [facility] at [phone] with questions or concerns regarding your invoice.   Our billing staff will not be able to assist you with questions regarding bills from these companies.  You will be contacted with the lab results as soon as they are available. The fastest way to get your results is to activate your My Chart account. Instructions are located on the last page of this paperwork. If you have not heard from us regarding the results in 2 weeks, please contact this office.     

## 2016-12-15 NOTE — Progress Notes (Signed)
Patient ID: Bryce Rodriguez, male    DOB: 12-Sep-1951, 65 y.o.   MRN: [id number]  PCP: Jenny Reichmann, MD  Chief Complaint  Patient presents with  . STD testing    For Trichomonas    Subjective:  HPI 65 year male  presents for evaluation of trichomonas infection. He reports that his wife was dx recently with infection and he feels that it is as a result of an affair with a male he had several years ago. He denies any recent unprotected sex with anyone except wife. Denies penile itching, irritation, or discharge. Pt is uncertain of what medication wife was treated with.   Social History   Social History  . Marital status: Married    Spouse name: N/A  . Number of children: 2  . Years of education: N/A   Occupational History  . 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 Smoking status: Never Smoker  . Smokeless tobacco: Current User    Types: Snuff  . Alcohol use 1.2 oz/week    2 Standard drinks or equivalent per week  . Drug use: No  . Sexual activity: Not on file   Other Topics Concern  . Not on file   Social History Narrative  . No narrative on file    Family History  Problem Relation Age of Onset  . Cancer    . Hypertension     Review of Systems See HPI  Patient Active Problem List   Diagnosis Date Noted  . Numbness and tingling in hands 05/01/2014  . Arthritis 02/24/2014  . HTN (hypertension) 01/23/2012  . Hypercholesteremia 01/23/2012    No Known Allergies  Prior to Admission medications   Medication Sig Start Date End Date Taking? Authorizing Provider  amLODipine (NORVASC) 10 MG tablet TAKE 1 BY MOUTH DAILY 08/25/16  Yes Jaynee Eagles, PA-C  aspirin 81 MG tablet Take 81 mg by mouth daily.   Yes Historical Provider, MD  fluticasone (FLONASE) 50 MCG/ACT nasal spray Place 2 sprays into both nostrils daily. 12/29/15  Yes Darlyne Russian, MD  losartan-hydrochlorothiazide (HYZAAR) 100-25 MG tablet TAKE 1 BY MOUTH DAILY 08/25/16  Yes Jaynee Eagles, PA-C  meloxicam (MOBIC) 15 MG tablet Take 1 tablet (15 mg total) by mouth daily. 12/29/15  Yes Darlyne Russian, MD  potassium chloride SA (KLOR-CON M20) 20 MEQ tablet Take 1 tablet (20 mEq total) by mouth 2 (two) times daily. 12/29/15  Yes Darlyne Russian, MD  rosuvastatin (CRESTOR) 20 MG tablet TAKE 1 BY MOUTH DAILY 08/25/16  Yes Jaynee Eagles, PA-C  tamsulosin (FLOMAX) 0.4 MG CAPS capsule Take 1 capsule (0.4 mg total) by mouth daily. 08/25/16  Yes Jaynee Eagles, PA-C    Past Medical, Surgical Family and Social History reviewed and updated.    Objective:   Today's Vitals   12/15/16 1715  BP: 140/90  Pulse: 78  Resp: 18  Temp: 98.1 F (36.7 C)  TempSrc: Oral  SpO2: 98%  Weight: 201 lb (91.2 kg)  Height: 5\' 5"  (1.651 m)    Wt Readings from Last 3 Encounters:  12/15/16 201 lb (91.2 kg)  08/25/16 191 lb 9.6 oz (86.9 kg)  01/08/16 201 lb 3.2 oz (91.3 kg)   Physical Exam  Constitutional: He appears well-developed and well-nourished.  HENT:  Head: Normocephalic and atraumatic.  Eyes: Pupils are equal, round, and reactive to light.  Neck: Normal range of motion. Neck supple.  Cardiovascular: Normal rate, regular rhythm, normal heart  sounds and intact distal pulses.   Pulmonary/Chest: Effort normal and breath sounds normal.  Skin: Skin is warm and dry.  Psychiatric: He has a normal mood and affect. His behavior is normal. Judgment and thought content normal.      Assessment & Plan:  1. STD (sexually transmitted disease)  Plan: Screen for present of STD: Trichomonas and GC/Chlamydia. If positive, treat accordingly.  Carroll Sage. Kenton Kingfisher, MSN, FNP-C [facility]

## 2016-12-19 ENCOUNTER — Telehealth: Payer: Self-pay

## 2016-12-19 LAB — GC/CHLAMYDIA PROBE AMP
Chlamydia trachomatis, NAA: NEGATIVE
Neisseria gonorrhoeae by PCR: NEGATIVE

## 2016-12-19 LAB — TRICHOMONAS VAGINALIS, PROBE AMP: Trich vag by NAA: NEGATIVE

## 2016-12-19 NOTE — Telephone Encounter (Signed)
PATIENT SAW KIMBERLY HARRIS ON Friday FOR STD TESTING. HE WOULD LIKE TO GET HIS LAB RESULTS PLEASE. BEST PHONE [phone] (CELL)  PHARMACY CHOICE IS [facility] ON [address].  [place]

## 2016-12-20 ENCOUNTER — Telehealth: Payer: Self-pay | Admitting: Emergency Medicine

## 2016-12-20 NOTE — Telephone Encounter (Signed)
Patient called requesting the results from his test. Please advise [phone]

## 2016-12-20 NOTE — Telephone Encounter (Signed)
-----   Message from [place] Muta, [place] sent at 12/19/2016  9:08 PM EST ----- All labs were negative. Please call patient to advise.

## 2016-12-20 NOTE — Telephone Encounter (Signed)
Pt given negative test results

## 2016-12-20 NOTE — Telephone Encounter (Signed)
Message sent to lab pool to contact pt to advise of negative results.

## 2017-01-13 ENCOUNTER — Ambulatory Visit (INDEPENDENT_AMBULATORY_CARE_PROVIDER_SITE_OTHER): Payer: BLUE CROSS/BLUE SHIELD | Admitting: Family Medicine

## 2017-01-13 VITALS — BP 144/80 | HR 91 | Temp 100.3°F | Resp 17 | Ht 66.5 in | Wt 200.4 lb

## 2017-01-13 DIAGNOSIS — R739 Hyperglycemia, unspecified: Secondary | ICD-10-CM | POA: Diagnosis not present

## 2017-01-13 DIAGNOSIS — N410 Acute prostatitis: Secondary | ICD-10-CM | POA: Diagnosis not present

## 2017-01-13 DIAGNOSIS — I10 Essential (primary) hypertension: Secondary | ICD-10-CM | POA: Diagnosis not present

## 2017-01-13 DIAGNOSIS — R300 Dysuria: Secondary | ICD-10-CM

## 2017-01-13 LAB — POC MICROSCOPIC URINALYSIS (UMFC)

## 2017-01-13 LAB — POCT URINALYSIS DIP (MANUAL ENTRY)
Bilirubin, UA: NEGATIVE
GLUCOSE UA: NEGATIVE
Ketones, POC UA: NEGATIVE
NITRITE UA: NEGATIVE
Spec Grav, UA: 1.025
UROBILINOGEN UA: 0.2
pH, UA: 6

## 2017-01-13 MED ORDER — CIPROFLOXACIN HCL 500 MG PO TABS: 500.0000 mg | ORAL_TABLET | Freq: Two times a day (BID) | ORAL | 1 refills | Status: DC

## 2017-01-13 MED ORDER — CEFTRIAXONE SODIUM 1 G IJ SOLR
1.0000 g | Freq: Once | INTRAMUSCULAR | Status: AC
  Administered 2017-01-13: 1 g via INTRAMUSCULAR

## 2017-01-13 NOTE — Patient Instructions (Addendum)
   IF you received an x-ray today, you will receive an invoice from [facility]. Please contact [facility] at [phone] with questions or concerns regarding your invoice.   IF you received labwork today, you will receive an invoice from [facility]. Please contact [facility] at [phone] with questions or concerns regarding your invoice.   Our billing staff will not be able to assist you with questions regarding bills from these companies.  You will be contacted with the lab results as soon as they are available. The fastest way to get your results is to activate your My Chart account. Instructions are located on the last page of this paperwork. If you have not heard from us regarding the results in 2 weeks, please contact this office.     Prostatitis Prostatitis is swelling or inflammation of the prostate gland. The prostate is a walnut-sized gland that is involved in the production of semen. It is located below a man's bladder, in front of the rectum. There are four types of prostatitis:  Chronic nonbacterial prostatitis. This is the most common type of prostatitis. It may be associated with a viral infection or autoimmune disorder.  Acute bacterial prostatitis. This is the least common type of prostatitis. It starts quickly and is usually associated with a bladder infection, high fever, and shaking chills. It can occur at any age.  Chronic bacterial prostatitis. This type usually results from acute bacterial prostatitis that happens repeatedly (is recurrent) or has not been treated properly. It can occur in men of any age but is most common among middle-aged men whose prostate has begun to get larger. The symptoms are not as severe as symptoms caused by acute bacterial prostatitis.  Prostatodynia or chronic pelvic pain syndrome (CPPS). This type is also called pelvic floor disorder. It is associated with increased muscular tone in the pelvis surrounding the  prostate.  What are the causes? Bacterial prostatitis is caused by infection from bacteria. Chronic nonbacterial prostatitis may be caused by:  Urinary tract infections (UTIs).  Nerve damage.  A response by the body's disease-fighting system (autoimmune response).  Chemicals in the urine.  The causes of the other types of prostatitis are usually not known. What are the signs or symptoms? Symptoms of this condition vary depending upon the type of prostatitis. If you have acute bacterial prostatitis, you may experience:  Urinary symptoms, such as: ? Painful urination. ? Burning during urination. ? Frequent and sudden urges to urinate. ? Inability to start urinating. ? A weak or interrupted stream of urine.  Vomiting.  Nausea.  Fever.  Chills.  Inability to empty the bladder completely.  Pain in the: ? Muscles or joints. ? Lower back. ? Lower abdomen.  If you have any of the other types of prostatitis, you may experience:  Urinary symptoms, such as: ? Sudden urges to urinate. ? Frequent urination. ? Difficulty starting urination. ? Weak urine stream. ? Dribbling after urination.  Discharge from the urethra. The urethra is a tube that opens at the end of the penis.  Pain in the: ? Testicles. ? Penis or tip of the penis. ? Rectum. ? Area in front of the rectum and below the scrotum (perineum).  Problems with sexual function.  Painful ejaculation.  Bloody semen.  How is this diagnosed? This condition may be diagnosed based on:  A physical and medical exam.  Your symptoms.  A urine test to check for bacteria.  An exam in which a health care provider uses a   finger to feel the prostate (digital rectal exam).  A test of a sample of semen.  Blood tests.  Ultrasound.  Removal of prostate tissue to be examined under a microscope (biopsy).  Tests to check how your body handles urine (urodynamic tests).  A test to look inside your bladder or urethra  (cystoscopy).  How is this treated? Treatment for this condition depends on the type of prostatitis. Treatment may involve:  Medicines to relieve pain or inflammation.  Medicines to help relax your muscles.  Physical therapy.  Heat therapy.  Techniques to help you control certain body functions (biofeedback).  Relaxation exercises.  Antibiotic medicine, if your condition is caused by bacteria.  Warm water baths (sitz baths). Sitz baths help with relaxing your pelvic floor muscles, which helps to relieve pressure on the prostate.  Follow these instructions at home:  Take over-the-counter and prescription medicines only as told by your health care provider.  If you were prescribed an antibiotic, take it as told by your health care provider. Do not stop taking the antibiotic even if you start to feel better.  If physical therapy, biofeedback, or relaxation exercises were prescribed, do exercises as instructed.  Take sitz baths as directed by your health care provider. For a sitz bath, sit in warm water that is deep enough to cover your hips and buttocks.  Keep all follow-up visits as told by your health care provider. This is important. Contact a health care provider if:  Your symptoms get worse.  You have a fever. Get help right away if:  You have chills.  You feel nauseous.  You vomit.  You feel light-headed or feel like you are going to faint.  You are unable to urinate.  You have blood or blood clots in your urine. This information is not intended to replace advice given to you by your health care provider. Make sure you discuss any questions you have with your health care provider. Document Released: 12/01/2000 Document Revised: 08/24/2016 Document Reviewed: 08/24/2016 Elsevier Interactive Patient Education  2017 Elsevier Inc.  

## 2017-01-13 NOTE — Progress Notes (Signed)
Subjective:    Patient ID: Bryce Rodriguez, male    DOB: 1951-08-09, 66 y.o.   MRN: [id number]  01/13/2017  Abdominal Pain (Lower, X 2 days, "pressure"); Dysuria (X 2 days); Hemorrhoids; Chills (X last night); and Medication Refill (All medications, just 10 tablets until mail orders come in mail)   HPI This 66 y.o. male presents for evaluation of dysuria, hemorrhoid pain, medication refill.  Onset fever two nights ago; Tmax 103.  Chills.  +HA.  No ear pain or sore throat.  No rhinorrhea/nasal congestion.  Minimal cough.  +dysuria moderate.  +genital pressure.  +hemorrhoids are messed up.  No genital swelling/scrotal swelling.  With urination, feels like needs to have bowel movement as well and vice versa.  Yesterday, walking to garage and had urge to urinate; had just urinated 10 minutes before and suffered with incontinence. Something is just pushing.  Nocturia x 12; baseline nocturia x 1.  No n/v; has had three b.m. Today which is unusual.  Having horrible pain with bowel movement.  Having constant rectal pain today.  Pain with sitting down.   Denies new sexual partners since visit last month for STD screening; screening was negative at that time. History of BPH; no history of prostatitis. History of hemorrhoids.  Internal hemorrhoids detected by colonoscopy in 2009.  PCP: [facility] with retirement   Review of Systems  Constitutional: Negative for activity change, appetite change, chills, diaphoresis, fatigue and fever.  HENT: Negative for congestion, postnasal drip, rhinorrhea and sore throat.   Eyes: Negative for visual disturbance.  Respiratory: Negative for cough and shortness of breath.   Cardiovascular: Negative for chest pain, palpitations and leg swelling.  Gastrointestinal: Positive for rectal pain. Negative for abdominal pain, anal bleeding, blood in stool, constipation, diarrhea, nausea and vomiting.  Endocrine: Negative for cold intolerance, heat intolerance, polydipsia, polyphagia and  polyuria.  Genitourinary: Positive for decreased urine volume, dysuria, frequency and urgency. Negative for discharge, flank pain, genital sores, hematuria, penile pain, penile swelling, scrotal swelling and testicular pain.  Neurological: Negative for dizziness, tremors, seizures, syncope, facial asymmetry, speech difficulty, weakness, light-headedness, numbness and headaches.    Past Medical History:  Diagnosis Date  . HLD (hyperlipidemia)   . HTN (hypertension)    Past Surgical History:  Procedure Laterality Date  . NECK SURGERY  May 19 2014  . none     No Known Allergies  Social History   Social History  . Marital status: Married    Spouse name: N/A  . Number of children: 2  . Years of education: N/A   Occupational History  . 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 Smoking status: Never Smoker  . Smokeless tobacco: Current User    Types: Snuff  . Alcohol use 1.2 oz/week    2 Standard drinks or equivalent per week  . Drug use: No  . Sexual activity: Not on file   Other Topics Concern  . Not on file   Social History Narrative  . No narrative on file   Family History  Problem Relation Age of Onset  . Cancer    . Hypertension         Objective:    BP (!) 144/80   Pulse 91   Temp 100.3 F (37.9 C) (Oral)   Resp 17   Ht 5' 6.5" (1.689 m)   Wt 200 lb 6.4 oz (90.9 kg)   SpO2 97%   BMI 31.86 kg/m  Physical Exam  Constitutional: He is oriented to person, place, and time. He appears well-developed and well-nourished. No distress.  HENT:  Head: Normocephalic and atraumatic.  Right Ear: External ear normal.  Left Ear: External ear normal.  Nose: Nose normal.  Mouth/Throat: Oropharynx is clear and moist.  Eyes: Conjunctivae and EOM are normal. Pupils are equal, round, and reactive to light.  Neck: Normal range of motion. Neck supple. Carotid bruit is not present. No thyromegaly present.  Cardiovascular: Normal rate, regular rhythm,  normal heart sounds and intact distal pulses.  Exam reveals no gallop and no friction rub.   No murmur heard. Pulmonary/Chest: Effort normal and breath sounds normal. He has no wheezes. He has no rales.  Abdominal: Soft. Bowel sounds are normal. He exhibits no distension and no mass. There is no tenderness. There is no rebound and no guarding. Hernia confirmed negative in the right inguinal area and confirmed negative in the left inguinal area.  Genitourinary: Testes normal and penis normal. Rectal exam shows tenderness. Rectal exam shows no external hemorrhoid, no fissure, no mass and anal tone normal. Prostate is enlarged and tender. Right testis shows no mass, no swelling and no tenderness. Left testis shows no mass, no swelling and no tenderness.  Genitourinary Comments: No palpable perirectal fluctuance appreciated.  Lymphadenopathy:    He has no cervical adenopathy.       Left: No inguinal adenopathy present.  Neurological: He is alert and oriented to person, place, and time. No cranial nerve deficit.  Skin: Skin is warm and dry. No rash noted. He is not diaphoretic.  Psychiatric: He has a normal mood and affect. His behavior is normal.  Nursing note and vitals reviewed.  Results for orders placed or performed in visit on 01/13/17  POCT urinalysis dipstick  Result Value Ref Range   Color, UA yellow yellow   Clarity, UA clear clear   Glucose, UA negative negative   Bilirubin, UA negative negative   Ketones, POC UA negative negative   Spec Grav, UA 1.025    Blood, UA moderate (A) negative   pH, UA 6.0    Protein Ur, POC =100 (A) negative   Urobilinogen, UA 0.2    Nitrite, UA Negative Negative   Leukocytes, UA small (1+) (A) Negative  POCT Microscopic Urinalysis ([facility])  Result Value Ref Range   WBC,UR,HPF,POC Too numerous to count  (A) None WBC/hpf   RBC,UR,HPF,POC Few (A) None RBC/hpf   Bacteria Few (A) None, Too numerous to count   Mucus Present (A) Absent   Epithelial Cells,  UR Per Microscopy Few (A) None, Too numerous to count cells/hpf       Assessment & Plan:   1. Dysuria   2. Acute prostatitis   3. Hyperglycemia   4. Essential hypertension    -new. -concerning for acute prostatitis. -send urine culture and PSA. -s/p Rocephin IM in office -rx for Cipro provided. -RTC if rectal pain worsens.  Orders Placed This Encounter  Procedures  . Urine culture  . CBC with Differential/Platelet  . PSA  . Comprehensive metabolic panel  . Hemoglobin A1c  . POCT urinalysis dipstick  . POCT Microscopic Urinalysis ([facility])   Meds ordered this encounter  Medications  . cefTRIAXone (ROCEPHIN) injection 1 g  . ciprofloxacin (CIPRO) 500 MG tablet    Sig: Take 1 tablet (500 mg total) by mouth 2 (two) times daily.    Dispense:  30 tablet    Refill:  1    No Follow-up on file.  Carry Ortez Elayne Guerin, M.D. Primary Care at [facility] [place] [address] [place], [place]  [address] [phone] phone [phone] fax

## 2017-01-15 ENCOUNTER — Telehealth: Payer: Self-pay

## 2017-01-15 LAB — CBC WITH DIFFERENTIAL/PLATELET
BASOS: 0 %
Basophils Absolute: 0 10*3/uL (ref 0.0–0.2)
EOS (ABSOLUTE): 0 10*3/uL (ref 0.0–0.4)
Eos: 0 %
HEMATOCRIT: 41.8 % (ref 37.5–51.0)
HEMOGLOBIN: 13.5 g/dL (ref 13.0–17.7)
IMMATURE GRANS (ABS): 0 10*3/uL (ref 0.0–0.1)
Immature Granulocytes: 0 %
Lymphocytes Absolute: 1.7 10*3/uL (ref 0.7–3.1)
Lymphs: 8 %
MCH: 28.1 pg (ref 26.6–33.0)
MCHC: 32.3 g/dL (ref 31.5–35.7)
MCV: 87 fL (ref 79–97)
Monocytes Absolute: 1.2 10*3/uL — ABNORMAL HIGH (ref 0.1–0.9)
Monocytes: 6 %
NEUTROS ABS: 18 10*3/uL — AB (ref 1.4–7.0)
Neutrophils: 86 %
PLATELETS: 234 10*3/uL (ref 150–379)
RBC: 4.81 x10E6/uL (ref 4.14–5.80)
RDW: 14.8 % (ref 12.3–15.4)
WBC: 20.9 10*3/uL (ref 3.4–10.8)

## 2017-01-15 LAB — COMPREHENSIVE METABOLIC PANEL
A/G RATIO: 1.9 (ref 1.2–2.2)
ALBUMIN: 4.5 g/dL (ref 3.6–4.8)
ALT: 31 IU/L (ref 0–44)
AST: 18 IU/L (ref 0–40)
Alkaline Phosphatase: 54 IU/L (ref 39–117)
BUN / CREAT RATIO: 13 (ref 10–24)
BUN: 13 mg/dL (ref 8–27)
Bilirubin Total: 0.7 mg/dL (ref 0.0–1.2)
CALCIUM: 9.3 mg/dL (ref 8.6–10.2)
CO2: 22 mmol/L (ref 18–29)
Chloride: 96 mmol/L (ref 96–106)
Creatinine, Ser: 1.04 mg/dL (ref 0.76–1.27)
GFR, EST AFRICAN AMERICAN: 87 mL/min/{1.73_m2} (ref >59)
GFR, EST NON AFRICAN AMERICAN: 76 mL/min/{1.73_m2} (ref >59)
GLOBULIN, TOTAL: 2.4 g/dL (ref 1.5–4.5)
Glucose: 99 mg/dL (ref 65–99)
Potassium: 3.3 mmol/L — ABNORMAL LOW (ref 3.5–5.2)
SODIUM: 138 mmol/L (ref 134–144)
TOTAL PROTEIN: 6.9 g/dL (ref 6.0–8.5)

## 2017-01-15 LAB — PSA: Prostate Specific Ag, Serum: 39.2 ng/mL — ABNORMAL HIGH (ref 0.0–4.0)

## 2017-01-15 LAB — HEMOGLOBIN A1C
Est. average glucose Bld gHb Est-mCnc: 131 mg/dL
Hgb A1c MFr Bld: 6.2 % — ABNORMAL HIGH (ref 4.8–5.6)

## 2017-01-15 NOTE — Telephone Encounter (Signed)
Please see abnormal labs called to [place]

## 2017-01-15 NOTE — Telephone Encounter (Signed)
Noted; abnormal labs noted and followed throughout the day.  No further action at this time. Awaiting urine culture sensitivities.

## 2017-01-17 ENCOUNTER — Telehealth: Payer: Self-pay

## 2017-01-17 DIAGNOSIS — R350 Frequency of micturition: Secondary | ICD-10-CM

## 2017-01-17 DIAGNOSIS — I10 Essential (primary) hypertension: Secondary | ICD-10-CM

## 2017-01-17 LAB — URINE CULTURE

## 2017-01-17 MED ORDER — LOSARTAN POTASSIUM-HCTZ 100-25 MG PO TABS: ORAL_TABLET | ORAL | 0 refills | Status: DC

## 2017-01-17 MED ORDER — AMLODIPINE BESYLATE 10 MG PO TABS: ORAL_TABLET | ORAL | 0 refills | Status: DC

## 2017-01-17 MED ORDER — TAMSULOSIN HCL 0.4 MG PO CAPS: 0.4000 mg | ORAL_CAPSULE | Freq: Every day | ORAL | 0 refills | Status: DC

## 2017-01-17 MED ORDER — ROSUVASTATIN CALCIUM 20 MG PO TABS: ORAL_TABLET | ORAL | 0 refills | Status: DC

## 2017-01-17 NOTE — Telephone Encounter (Signed)
30 day supply sent to local [facility] to write a note for off 01/13/17-01/18/17 ? New end date per pt.

## 2017-01-17 NOTE — Telephone Encounter (Signed)
Pt needs return to work note dated from 1/27 thru 2/5.  Also states that the medication is working but is giving him head aches and he still has some pain.   Regular medications were not called in, needs a 10 day supply until the mail order ones arrive. Tamsulosin, Amlodipine, Losartan, and Rosuvastatin.  Would like a call back about the meds. Thanks!

## 2017-01-19 NOTE — Telephone Encounter (Signed)
Yes.  Work note until 2/1 please.

## 2017-02-01 ENCOUNTER — Telehealth: Payer: Self-pay

## 2017-02-01 NOTE — Telephone Encounter (Signed)
PATIENT STATES WHEN HE WAS IN TO SEE DR. Tamala Julian AT THE END OF January SHE DID SOME LAB WORK ON HIM. HE SAID HE HAS NEVER GOTTEN THE RESULTS. ALSO SHE SENT IN A 30 DAY SUPPLY OF HIS MEDICATIONS. DUE TO HIS INSURANCE THEY NOW USE [facility] INSTEAD OF [facility]. HE NEEDS THE REMAINDER OF HIS REFILLS SENT THERE. HE USUALLY GETS A 90 DAY SUPPLY. BEST PHONE [phone] (CELL) PHARMACY CHOICE IS [facility] ON [address]. [place]

## 2017-02-01 NOTE — Telephone Encounter (Signed)
Please see abnormal results and advise 

## 2017-02-05 NOTE — Telephone Encounter (Signed)
Call --- labs showed urinary tract infection with prostate infection.  Antibiotic appropriately treated infection. How is patient feeling?

## 2017-02-06 ENCOUNTER — Other Ambulatory Visit: Payer: Self-pay | Admitting: *Deleted

## 2017-02-06 DIAGNOSIS — R350 Frequency of micturition: Secondary | ICD-10-CM

## 2017-02-06 DIAGNOSIS — I10 Essential (primary) hypertension: Secondary | ICD-10-CM

## 2017-02-06 DIAGNOSIS — Z9109 Other allergy status, other than to drugs and biological substances: Secondary | ICD-10-CM

## 2017-02-06 HISTORY — DX: Other allergy status, other than to drugs and biological substances: Z91.09

## 2017-02-06 MED ORDER — ROSUVASTATIN CALCIUM 20 MG PO TABS: ORAL_TABLET | ORAL | 1 refills | Status: DC

## 2017-02-06 MED ORDER — TAMSULOSIN HCL 0.4 MG PO CAPS: 0.4000 mg | ORAL_CAPSULE | Freq: Every day | ORAL | 0 refills | Status: DC

## 2017-02-06 MED ORDER — FLUTICASONE PROPIONATE 50 MCG/ACT NA SUSP: 2.0000 | Freq: Every day | NASAL | 1 refills | Status: DC

## 2017-02-06 MED ORDER — LOSARTAN POTASSIUM-HCTZ 100-25 MG PO TABS: ORAL_TABLET | ORAL | 1 refills | Status: DC

## 2017-02-06 NOTE — Telephone Encounter (Signed)
Spoke with patient - advised of results

## 2017-02-14 ENCOUNTER — Other Ambulatory Visit: Payer: Self-pay | Admitting: Family Medicine

## 2017-02-14 DIAGNOSIS — I10 Essential (primary) hypertension: Secondary | ICD-10-CM

## 2017-02-14 DIAGNOSIS — R350 Frequency of micturition: Secondary | ICD-10-CM

## 2017-03-13 ENCOUNTER — Ambulatory Visit (INDEPENDENT_AMBULATORY_CARE_PROVIDER_SITE_OTHER): Payer: BLUE CROSS/BLUE SHIELD | Admitting: Physician Assistant

## 2017-03-13 VITALS — BP 141/75 | HR 94 | Temp 99.2°F | Resp 16 | Ht 66.5 in | Wt 201.0 lb

## 2017-03-13 DIAGNOSIS — D72829 Elevated white blood cell count, unspecified: Secondary | ICD-10-CM

## 2017-03-13 DIAGNOSIS — J302 Other seasonal allergic rhinitis: Secondary | ICD-10-CM | POA: Diagnosis not present

## 2017-03-13 DIAGNOSIS — N419 Inflammatory disease of prostate, unspecified: Secondary | ICD-10-CM | POA: Diagnosis not present

## 2017-03-13 DIAGNOSIS — R972 Elevated prostate specific antigen [PSA]: Secondary | ICD-10-CM | POA: Diagnosis not present

## 2017-03-13 DIAGNOSIS — R300 Dysuria: Secondary | ICD-10-CM

## 2017-03-13 LAB — POCT CBC
Granulocyte percent: 82.8 % — AB (ref 37–80)
HCT, POC: 38.5 % — AB (ref 43.5–53.7)
Hemoglobin: 13.2 g/dL — AB (ref 14.1–18.1)
Lymph, poc: 2.4 (ref 0.6–3.4)
MCH, POC: 29.2 pg (ref 27–31.2)
MCHC: 34.4 g/dL (ref 31.8–35.4)
MCV: 84.7 fL (ref 80–97)
MID (cbc): 1.1 — AB (ref 0–0.9)
MPV: 6.9 fL (ref 0–99.8)
POC Granulocyte: 17 — AB (ref 2–6.9)
POC LYMPH PERCENT: 11.8 % (ref 10–50)
POC MID %: 5.4 %M (ref 0–12)
Platelet Count, POC: 288 10*3/uL (ref 142–424)
RBC: 4.54 M/uL — AB (ref 4.69–6.13)
RDW, POC: 14 %
WBC: 20.5 10*3/uL — AB (ref 4.6–10.2)

## 2017-03-13 LAB — POC MICROSCOPIC URINALYSIS (UMFC)

## 2017-03-13 LAB — POCT URINALYSIS DIP (MANUAL ENTRY)
Bilirubin, UA: NEGATIVE
Glucose, UA: NEGATIVE
Nitrite, UA: POSITIVE — AB
Protein Ur, POC: 30 — AB
Spec Grav, UA: 1.02 (ref 1.030–1.035)
Urobilinogen, UA: 0.2 (ref ?–2.0)
pH, UA: 5.5 (ref 5.0–8.0)

## 2017-03-13 LAB — POCT INFLUENZA A/B
Influenza A, POC: NEGATIVE
Influenza B, POC: NEGATIVE

## 2017-03-13 MED ORDER — CEFTRIAXONE SODIUM 1 G IJ SOLR
1.0000 g | Freq: Once | INTRAMUSCULAR | Status: AC
  Administered 2017-03-13: 1 g via INTRAMUSCULAR

## 2017-03-13 MED ORDER — FLUTICASONE PROPIONATE 50 MCG/ACT NA SUSP: 2.0000 | Freq: Every day | NASAL | 1 refills | Status: DC

## 2017-03-13 MED ORDER — CIPROFLOXACIN HCL 500 MG PO TABS: 500.0000 mg | ORAL_TABLET | Freq: Two times a day (BID) | ORAL | 0 refills | Status: AC

## 2017-03-13 NOTE — Patient Instructions (Addendum)
Go to the emergency department if you develop shaking chills, fever, worsening symptoms.  Please come back tomorrow.  Please come back in 7 days for repeat urine culture.   In most cases, fever abates and dysuria disappears within two to six days after the start of therapy. DO NOT STOP TAKING THIS MEDICATION. Please take entire six week course, unless advised otherwise by a provider here.   Thank you for coming in today. I hope you feel we met your needs.  Feel free to call [facility] if you have any questions or further requests.  Please consider signing up for MyChart if you do not already have it, as this is a great way to communicate with me.  Best,  Whitney McVey, PA-C   Prostatitis is an infection or inflammation of the prostate gland. The prostate gland is present only in men. Its job is to make some of the fluid that men release during sex. The prostate gland forms a ring around the urethra, the tube that carries urine from the bladder to the end of the penis   What causes prostatitis?-Prostatitis can be "acute" or "chronic." Acute prostatitis is usually caused by bacteria. Chronic prostatitis is sometimes caused by a bacteria. But chronic prostatitis can sometimes happen even when there is no bacterial infection. What are the symptoms of prostatitis?-There are different types of prostatitis, and they each cause different symptoms. ?Men with acute prostatitis can have: .Fever .Chills .Flu-like symptoms .Muscle pain .Pain when they urinate .Pain in or near the groin or genitals .Cloudy urine ?Men with chronic prostatitis sometimes have no symptoms. When they do have symptoms, they can: .Have pain when they urinate .Feel like they have to urinate a lot more often than normal .Have the sudden need to urinate in a hurry .Have pain in or near the groin or genitals .Have a low-grade fever    IF you received an x-ray today, you will receive an invoice from [facility].  Please contact [facility] at [phone] with questions or concerns regarding your invoice.   IF you received labwork today, you will receive an invoice from [facility]. Please contact [facility] at [phone] with questions or concerns regarding your invoice.   Our billing staff will not be able to assist you with questions regarding bills from these companies.  You will be contacted with the lab results as soon as they are available. The fastest way to get your results is to activate your My Chart account. Instructions are located on the last page of this paperwork. If you have not heard from [place] regarding the results in 2 weeks, please contact this office.

## 2017-03-13 NOTE — Progress Notes (Signed)
Bryce Rodriguez  MRN: [id number] DOB: 17-Dec-1951  PCP: No PCP Per Patient  Subjective:  Pt is a 66 year old male PMH HTN, HLD, arthritis, seasonal allergeis who presents to clinic for flu-like symptoms and burning with urination.   Woke up from sleep last night with shaking chills, Body aches, and feeling feverish. Took 3 ibuprofen and felt better after about 10 minutes.  Urinary symptoms x 2-3 days. Endorses occasional "stinging" with urination. Happens when he doesn't get proper rest. Increased frequency occasionally.  Denies perianal pain, rectal pain, nausea, vomiting, diarrhea, blood in his urine, abdominal pain.  H/o prostatitis - he was here 01/13/2017 for lower abdominal pain, dysuria and chills. He was treated with IM Rocephin and 2 weeks of Cipro 500mg . He felt better until a few days ago.   H/o seasonal allergies - needs refill of his flonase.   Review of Systems  Constitutional: Positive for chills and fever. Negative for diaphoresis.  Respiratory: Negative for cough, chest tightness, shortness of breath and wheezing.   Cardiovascular: Negative for chest pain, palpitations and leg swelling.  Gastrointestinal: Negative for diarrhea, nausea and vomiting.  Genitourinary: Positive for dysuria.  Musculoskeletal: Positive for back pain.  Neurological: Negative for dizziness, syncope, light-headedness and headaches.  Psychiatric/Behavioral: Positive for sleep disturbance.    Patient Active Problem List   Diagnosis Date Noted  . Environmental allergies 02/06/2017  . Numbness and tingling in hands 05/01/2014  . Arthritis 02/24/2014  . HTN (hypertension) 01/23/2012  . Hypercholesteremia 01/23/2012    Current Outpatient Prescriptions on File Prior to Visit  Medication Sig Dispense Refill  . amLODipine (NORVASC) 10 MG tablet TAKE 1 TABLET BY MOUTH EVERY DAY 30 tablet 0  . aspirin 81 MG tablet Take 81 mg by mouth daily.    . fluticasone (FLONASE) 50 MCG/ACT nasal spray Place 2  sprays into both nostrils daily. 48 g 1  . losartan-hydrochlorothiazide (HYZAAR) 100-25 MG tablet TAKE 1 TABLET BY MOUTH EVERY DAY 30 tablet 0  . meloxicam (MOBIC) 15 MG tablet Take 1 tablet (15 mg total) by mouth daily. 14 tablet 0  . potassium chloride SA (KLOR-CON M20) 20 MEQ tablet Take 1 tablet (20 mEq total) by mouth 2 (two) times daily. 10 tablet 0  . rosuvastatin (CRESTOR) 20 MG tablet TAKE 1 BY MOUTH DAILY 90 tablet 1  . tamsulosin (FLOMAX) 0.4 MG CAPS capsule TAKE 1 CAPSULE (0.4 MG TOTAL) BY MOUTH DAILY. 30 capsule 0   No current facility-administered medications on file prior to visit.     No Known Allergies   Objective:  BP (!) 141/75   Pulse 94   Temp 99.2 F (37.3 C) (Oral)   Resp 16   Ht 5' 6.5" (1.689 m)   Wt 201 lb (91.2 kg)   SpO2 97%   BMI 31.96 kg/m   Physical Exam  Constitutional: He is oriented to person, place, and time and well-developed, well-nourished, and in no distress.  Non-toxic appearance. He does not have a sickly appearance. No distress.  Cardiovascular: Normal rate, regular rhythm and normal heart sounds.   Abdominal: There is no tenderness. There is CVA tenderness.  Genitourinary: Rectum normal. Prostate is enlarged and tender.  Neurological: He is alert and oriented to person, place, and time. GCS score is 15.  Skin: Skin is warm and dry.  Psychiatric: Mood, memory, affect and judgment normal.  Vitals reviewed.   Lab Results  Component Value Date   WBC 20.5 (A) 03/13/2017   HGB  13.2 (A) 03/13/2017   HCT 38.5 (A) 03/13/2017   MCV 84.7 03/13/2017   PLT 234 01/13/2017   PSA   39.2ng/mL    01/13/2017  Results for orders placed or performed in visit on 03/13/17  POCT Microscopic Urinalysis ([facility])  Result Value Ref Range   WBC,UR,HPF,POC Many (A) None WBC/hpf   RBC,UR,HPF,POC Few (A) None RBC/hpf   Bacteria Moderate (A) None, Too numerous to count   Mucus Present (A) Absent   Epithelial Cells, UR Per Microscopy Few (A) None, Too  numerous to count cells/hpf  POCT urinalysis dipstick  Result Value Ref Range   Color, UA yellow yellow   Clarity, UA cloudy (A) clear   Glucose, UA negative negative   Bilirubin, UA negative negative   Ketones, POC UA trace (5) (A) negative   Spec Grav, UA 1.020 1.030 - 1.035   Blood, UA trace-lysed (A) negative   pH, UA 5.5 5.0 - 8.0   Protein Ur, POC =30 (A) negative   Urobilinogen, UA 0.2 Negative - 2.0   Nitrite, UA Positive (A) Negative   Leukocytes, UA moderate (2+) (A) Negative  POCT CBC  Result Value Ref Range   WBC 20.5 (A) 4.6 - 10.2 K/uL   Lymph, poc 2.4 0.6 - 3.4   POC LYMPH PERCENT 11.8 10 - 50 %L   MID (cbc) 1.1 (A) 0 - 0.9   POC MID % 5.4 0 - 12 %M   POC Granulocyte 17.0 (A) 2 - 6.9   Granulocyte percent 82.8 (A) 37 - 80 %G   RBC 4.54 (A) 4.69 - 6.13 M/uL   Hemoglobin 13.2 (A) 14.1 - 18.1 g/dL   HCT, POC 38.5 (A) 43.5 - 53.7 %   MCV 84.7 80 - 97 fL   MCH, POC 29.2 27 - 31.2 pg   MCHC 34.4 31.8 - 35.4 g/dL   RDW, POC 14.0 %   Platelet Count, POC 288 142 - 424 K/uL   MPV 6.9 0 - 99.8 fL  POCT Influenza A/B  Result Value Ref Range   Influenza A, POC Negative Negative   Influenza B, POC Negative Negative    Assessment and Plan :  This case was discussed with Dr. Carlota Raspberry  1. Prostatitis, unspecified prostatitis type 2. Leukocytosis, unspecified type 3. Elevated PSA measurement 4. Dysuria - cefTRIAXone (ROCEPHIN) injection 1 g; Inject 1 g into the muscle once. - ciprofloxacin (CIPRO) 500 MG tablet; Take 1 tablet (500 mg total) by mouth 2 (two) times daily.  Dispense: 90 tablet; Refill: 0 - Urine culture; Future - POCT Microscopic Urinalysis ([facility]) - POCT urinalysis dipstick - Urine culture - POCT CBC - POCT Influenza A/B - PSA - Plan to manage this pt in the outpatient setting with 6-weeks of Cipro - Pt has no major comorbidities, no signs or symptoms of severe sepsis, and can reliably take and tolerate oral antibiotics. - RTC tomorrow for f/u and  repeat POCT CBC. Discussed with pt s/s that should lead him to the emergency department. He understands and agrees.  - RTC in 7 days for lab only repeat urine culture. If it is still positive, alternative therapy should be initiated. If persistently positive urine culture pt should be evaluated and treated for chronic bacterial prostatitis or abscess.   5. Seasonal allergic rhinitis, unspecified chronicity, unspecified trigger - fluticasone (FLONASE) 50 MCG/ACT nasal spray; Place 2 sprays into both nostrils daily.  Dispense: 48 g; Refill: 1   Bryce Melanye Hiraldo, PA-C  Primary Care at [facility]  [facility] 03/13/2017 5:51 PM

## 2017-03-14 ENCOUNTER — Other Ambulatory Visit: Payer: BLUE CROSS/BLUE SHIELD

## 2017-03-14 ENCOUNTER — Other Ambulatory Visit: Payer: Self-pay | Admitting: Physician Assistant

## 2017-03-14 DIAGNOSIS — N419 Inflammatory disease of prostate, unspecified: Secondary | ICD-10-CM

## 2017-03-14 LAB — PSA: Prostate Specific Ag, Serum: 12.1 ng/mL — ABNORMAL HIGH (ref 0.0–4.0)

## 2017-03-15 LAB — URINE CULTURE

## 2017-03-20 NOTE — Progress Notes (Signed)
Please call pt and ask him to come in for a LAB ONLY visit.  He came in last week - but he was supposed to see me. The urine sample was for 1 week into his course of antibiotics.  (we cancelled last week's so he will not be charged) Thank you!

## 2017-03-30 ENCOUNTER — Other Ambulatory Visit (INDEPENDENT_AMBULATORY_CARE_PROVIDER_SITE_OTHER): Payer: BLUE CROSS/BLUE SHIELD | Admitting: Physician Assistant

## 2017-03-30 DIAGNOSIS — N419 Inflammatory disease of prostate, unspecified: Secondary | ICD-10-CM

## 2017-03-31 LAB — URINE CULTURE: Organism ID, Bacteria: NO GROWTH

## 2017-04-04 ENCOUNTER — Telehealth: Payer: Self-pay | Admitting: Family Medicine

## 2017-04-04 NOTE — Telephone Encounter (Signed)
Pt calling about lab result.

## 2017-04-06 NOTE — Telephone Encounter (Signed)
Notified nl urine

## 2017-04-17 ENCOUNTER — Ambulatory Visit (INDEPENDENT_AMBULATORY_CARE_PROVIDER_SITE_OTHER): Payer: BLUE CROSS/BLUE SHIELD

## 2017-04-17 ENCOUNTER — Other Ambulatory Visit: Payer: Self-pay

## 2017-04-17 ENCOUNTER — Encounter: Payer: Self-pay | Admitting: Family Medicine

## 2017-04-17 ENCOUNTER — Ambulatory Visit (INDEPENDENT_AMBULATORY_CARE_PROVIDER_SITE_OTHER): Payer: BLUE CROSS/BLUE SHIELD | Admitting: Family Medicine

## 2017-04-17 VITALS — BP 128/60 | HR 75 | Temp 98.3°F | Resp 16 | Ht 65.5 in | Wt 195.0 lb

## 2017-04-17 DIAGNOSIS — R200 Anesthesia of skin: Secondary | ICD-10-CM

## 2017-04-17 DIAGNOSIS — R079 Chest pain, unspecified: Secondary | ICD-10-CM | POA: Diagnosis not present

## 2017-04-17 DIAGNOSIS — E782 Mixed hyperlipidemia: Secondary | ICD-10-CM | POA: Diagnosis not present

## 2017-04-17 DIAGNOSIS — I10 Essential (primary) hypertension: Secondary | ICD-10-CM | POA: Diagnosis not present

## 2017-04-17 DIAGNOSIS — R0789 Other chest pain: Secondary | ICD-10-CM

## 2017-04-17 MED ORDER — GABAPENTIN 100 MG PO CAPS: 100.0000 mg | ORAL_CAPSULE | Freq: Three times a day (TID) | ORAL | 3 refills | Status: DC

## 2017-04-17 MED ORDER — CYCLOBENZAPRINE HCL 10 MG PO TABS: 10.0000 mg | ORAL_TABLET | Freq: Three times a day (TID) | ORAL | 0 refills | Status: DC | PRN

## 2017-04-17 NOTE — Progress Notes (Signed)
Chief Complaint  Patient presents with  . Numbness    arm  . 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   He states that he has had a stress test in the past   Hypertension He is compliant with his bp meds He tries to adhere to a low salt diet Denies any side effects of his medications BP Readings from Last 3 Encounters:  04/17/17 128/60  03/13/17 (!) 141/75  01/13/17 (!) 144/80     Past Medical History:  Diagnosis Date  . HLD (hyperlipidemia)   . HTN (hypertension)     Current Outpatient Prescriptions  Medication Sig Dispense Refill  . amLODipine (NORVASC) 10 MG tablet TAKE 1 TABLET BY MOUTH EVERY DAY 30 tablet 0  . aspirin 81 MG tablet Take 81 mg by mouth daily.    . ciprofloxacin (CIPRO) 500 MG tablet Take 1 tablet (500 mg total) by mouth 2 (two) times daily. 90 tablet 0  . fluticasone (FLONASE) 50 MCG/ACT nasal spray Place 2 sprays into both nostrils daily. 48 g 1  . losartan-hydrochlorothiazide (HYZAAR) 100-25 MG tablet TAKE 1 TABLET BY MOUTH EVERY DAY 30 tablet 0  . meloxicam (MOBIC) 15 MG tablet Take 1 tablet (15 mg total) by mouth daily. 14 tablet 0  . potassium chloride SA (KLOR-CON M20) 20 MEQ tablet Take 1 tablet (20 mEq total) by mouth 2 (two) times daily. 10 tablet 0  . rosuvastatin (CRESTOR) 20 MG tablet TAKE 1 BY MOUTH DAILY 90 tablet 1  . tamsulosin (FLOMAX) 0.4 MG CAPS capsule TAKE 1 CAPSULE (0.4 MG TOTAL) BY MOUTH DAILY. 30 capsule 0  . cyclobenzaprine (FLEXERIL) 10 MG tablet Take 1 tablet (10 mg total) by mouth 3 (three) times daily as needed for  muscle spasms. 30 tablet 0  . gabapentin (NEURONTIN) 100 MG capsule Take 1 capsule (100 mg total) by mouth 3 (three) times daily. 90 capsule 3   No current facility-administered medications for this visit.     Allergies: No Known Allergies  Past Surgical History:  Procedure Laterality Date  . NECK SURGERY  May 19 2014  . none      Social History   Social History  . Marital status: Married    Spouse name: N/A  . Number of children: 2  . Years of education: N/A   Occupational History  . 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 Smoking status: Never Smoker  . Smokeless tobacco: Current User    Types: Snuff  . Alcohol use 1.2 oz/week    2 Standard drinks or equivalent per week  . Drug use: No  . Sexual activity: Not Asked   Other Topics Concern  . None   Social History Narrative  . None    Review of Systems  Constitutional: Negative for chills, fever and weight loss.  Eyes: Negative for blurred vision and double vision.  Gastrointestinal: Negative for abdominal pain, nausea and vomiting.  Neurological: Negative for dizziness and headaches.   See hpi  Objective:  Vitals:   04/17/17 1615  BP: 128/60  Pulse: 75  Resp: 16  Temp: 98.3 F (36.8 C)  TempSrc: Oral  SpO2: 97%  Weight: 195 lb (88.5 kg)  Height: 5' 5.5" (1.664 m)    Physical Exam  Constitutional: He is oriented to person, place, and time. He appears well-developed and well-nourished.  HENT:  Head: Normocephalic and atraumatic.  Eyes: Conjunctivae and EOM are normal.  Cardiovascular: Normal rate, regular rhythm and normal heart sounds.   No murmur heard. Pulmonary/Chest: Effort normal and breath sounds normal. No respiratory distress. He has no wheezes.  Abdominal: Soft. Bowel sounds are normal. He exhibits no distension and no mass. There is no tenderness. There is no rebound and no guarding. No hernia.  Musculoskeletal:  Bilateral trapezius muscle spasm With flexion and  extension as well tenderness with rotation and range of motion  Neurological: He is alert and oriented to person, place, and time.  Skin: Skin is warm. Capillary refill takes less than 2 seconds.     I independently reviewed ECG and there are no st segment elevations or twi Compared to the last ECG 03/25/13 there are improvements noted   FINDINGS: Seven cervical segments are well visualized. Changes of prior cervical fusion are noted at C5-6. Osteophytic changes are noted from C3-C7. The odontoid is within normal limits. No prevertebral soft tissue changes are seen. No acute fracture or acute facet abnormality is noted.  IMPRESSION: Degenerative and postsurgical changes.  No acute abnormality noted.   Electronically Signed   By: Inez Catalina M.D.   On: 04/17/2017 17:20   Assessment and Plan Jaxxon was seen today for numbness and chest tightness.  Diagnoses and all orders for this visit:  Chest pain, unspecified type -     EKG 12-Lead  Essential hypertension-  Advise to follow up with stress testing as hypertension is a risk factor for CAD -     Ambulatory referral to Cardiology  Mixed hyperlipidemia- will send for stress test -     Ambulatory referral to Cardiology  Arm numbness-  Checked his c-spine for new changes and there are no new changes therefore will treat for muscle spasm that might be compressing the nerves He should try flexeril and gabapentin He should not operate machinery  -     Ambulatory referral to Cardiology -     DG Cervical Spine Complete; Future  Chest tightness- will refer for stress testing, no ischemic changes on ecg -     Ambulatory referral to Cardiology  Other orders -     gabapentin (NEURONTIN) 100 MG capsule; Take 1 capsule (100 mg total) by mouth 3 (three) times daily. -     cyclobenzaprine (FLEXERIL) 10 MG tablet; Take 1 tablet (10 mg total) by mouth 3 (three) times daily as needed for muscle spasms.  A total of 40 minutes were spent  face-to-face with the patient during this encounter and over half of that time was spent on counseling and coordination of care.    [place]

## 2017-04-17 NOTE — Patient Instructions (Addendum)
   IF you received an x-ray today, you will receive an invoice from [facility]. Please contact [facility] at [phone] with questions or concerns regarding your invoice.   IF you received labwork today, you will receive an invoice from [facility]. Please contact [facility] at [phone] with questions or concerns regarding your invoice.   Our billing staff will not be able to assist you with questions regarding bills from these companies.  You will be contacted with the lab results as soon as they are available. The fastest way to get your results is to activate your My Chart account. Instructions are located on the last page of this paperwork. If you have not heard from us regarding the results in 2 weeks, please contact this office.     Exercise Stress Electrocardiogram An exercise stress electrocardiogram is a test that is done to evaluate the blood supply to your heart. This test may also be called exercise stress electrocardiography. The test is done while you are walking on a treadmill. The goal of this test is to raise your heart rate. This test is done to find areas of poor blood flow to the heart by determining the extent of coronary artery disease (CAD). CAD is defined as narrowing in one or more heart (coronary) arteries of more than 70%. If you have an abnormal test result, this may mean that you are not getting adequate blood flow to your heart during exercise. Additional testing may be needed to understand why your test was abnormal. Tell a health care provider about:  Any allergies you have.  All medicines you are taking, including vitamins, herbs, eye drops, creams, and over-the-counter medicines.  Any problems you or family members have had with anesthetic medicines.  Any blood disorders you have.  Any surgeries you have had.  Any medical conditions you have.  Possibility of pregnancy, if this applies. What are the risks? Generally, this is a  safe procedure. However, as with any procedure, complications can occur. Possible complications can include:  Pain or pressure in the following areas: ? Chest. ? Jaw or neck. ? Between your shoulder blades. ? Radiating down your left arm.  Dizziness or light-headedness.  Shortness of breath.  Increased or irregular heartbeats.  Nausea or vomiting.  Heart attack (rare).  What happens before the procedure?  Avoid all forms of caffeine 24 hours before your test or as directed by your health care provider. This includes coffee, tea (even decaffeinated tea), caffeinated sodas, chocolate, cocoa, and certain pain medicines.  Follow your health care provider's instructions regarding eating and drinking before the test.  Take your medicines as directed at regular times with water unless instructed otherwise. Exceptions may include: ? If you have diabetes, ask how you are to take your insulin or pills. It is common to adjust insulin dosing the morning of the test. ? If you are taking beta-blocker medicines, it is important to talk to your health care provider about these medicines well before the date of your test. Taking beta-blocker medicines may interfere with the test. In some cases, these medicines need to be changed or stopped 24 hours or more before the test. ? If you wear a nitroglycerin patch, it may need to be removed prior to the test. Ask your health care provider if the patch should be removed before the test.  If you use an inhaler for any breathing condition, bring it with you to the test.  If you are an outpatient, bring a   snack so you can eat right after the stress phase of the test.  Do not smoke for 4 hours prior to the test or as directed by your health care provider.  Do not apply lotions, powders, creams, or oils on your chest prior to the test.  Wear loose-fitting clothes and comfortable shoes for the test. This test involves walking on a treadmill. What happens  during the procedure?  Multiple patches (electrodes) will be put on your chest. If needed, small areas of your chest may have to be shaved to get better contact with the electrodes. Once the electrodes are attached to your body, multiple wires will be attached to the electrodes and your heart rate will be monitored.  Your heart will be monitored both at rest and while exercising.  You will walk on a treadmill. The treadmill will be started at a slow pace. The treadmill speed and incline will gradually be increased to raise your heart rate. What happens after the procedure?  Your heart rate and blood pressure will be monitored after the test.  You may return to your normal schedule including diet, activities, and medicines, unless your health care provider tells you otherwise. This information is not intended to replace advice given to you by your health care provider. Make sure you discuss any questions you have with your health care provider. Document Released: 12/01/2000 Document Revised: 05/11/2016 Document Reviewed: 08/11/2013 Elsevier Interactive Patient Education  2017 Elsevier Inc.  

## 2017-05-17 ENCOUNTER — Ambulatory Visit: Payer: Self-pay | Admitting: Cardiology

## 2017-06-06 NOTE — Progress Notes (Signed)
Labs only

## 2017-06-07 ENCOUNTER — Other Ambulatory Visit: Payer: Self-pay | Admitting: Emergency Medicine

## 2017-06-07 ENCOUNTER — Other Ambulatory Visit: Payer: Self-pay | Admitting: Family Medicine

## 2017-06-07 ENCOUNTER — Telehealth: Payer: Self-pay | Admitting: Family Medicine

## 2017-06-07 DIAGNOSIS — R350 Frequency of micturition: Secondary | ICD-10-CM

## 2017-06-07 DIAGNOSIS — I10 Essential (primary) hypertension: Secondary | ICD-10-CM

## 2017-06-07 MED ORDER — AMLODIPINE BESYLATE 10 MG PO TABS: 10.0000 mg | ORAL_TABLET | Freq: Every day | ORAL | 1 refills | Status: DC

## 2017-06-07 MED ORDER — LOSARTAN POTASSIUM-HCTZ 100-25 MG PO TABS: 1.0000 | ORAL_TABLET | Freq: Every day | ORAL | 1 refills | Status: DC

## 2017-06-07 MED ORDER — GABAPENTIN 100 MG PO CAPS: 100.0000 mg | ORAL_CAPSULE | Freq: Three times a day (TID) | ORAL | 3 refills | Status: DC

## 2017-06-07 MED ORDER — ROSUVASTATIN CALCIUM 20 MG PO TABS: ORAL_TABLET | ORAL | 1 refills | Status: DC

## 2017-06-07 MED ORDER — FLUTICASONE PROPIONATE 50 MCG/ACT NA SUSP: 2.0000 | Freq: Every day | NASAL | 1 refills | Status: DC

## 2017-06-07 NOTE — Telephone Encounter (Signed)
Patient needs his amLODipine (NORVASC) 10 MG tablet, cyclobenzaprine (FLEXERIL) 10 MG tablet, fluticasone (FLONASE) 50 MCG/ACT nasal spray,  gabapentin (NEURONTIN) 100 MG capsule, losartan-hydrochlorothiazide (HYZAAR) 100-25 MG tablet, meloxicam (MOBIC) 15 MG tablet, rosuvastatin (CRESTOR) 20 MG tablet, tamsulosin (FLOMAX) 0.4 MG CAPS capsule called into the [facility] on [address].  He states he is going to start using a mail order pharmacy but they will not be sent to his house in time and he needs just a week of all these medications called in so he doesn't run out of them.

## 2017-06-07 NOTE — Telephone Encounter (Signed)
Last lab KCl was 3.3 Refill request for potassium 20 meq Please advise

## 2017-06-07 NOTE — Telephone Encounter (Signed)
Amldopine, Flomax, Flonase, Gabapentin, Losartan-HCTZ, Crestor ordered and sent to [facility] on [address] I will route other med request for approval

## 2017-06-11 NOTE — Telephone Encounter (Signed)
Call --- patient is due for six month follow-up on high blood pressure.  I see that he saw Dr. Nolon Rod in May 2018 for chest pain yet he needs follow-up for high blood pressure,sugar check, and cholesterol check.

## 2017-06-11 NOTE — Telephone Encounter (Signed)
See refill or telephone message from past week; pt needs follow-up appointment for high blood pressure, high cholesterol, glucose intolerance, recurrent prostatitis/elevated PSA.  Scheduling to contact pt for appointment.

## 2017-06-12 ENCOUNTER — Encounter: Payer: Self-pay | Admitting: Family Medicine

## 2017-06-12 ENCOUNTER — Ambulatory Visit (INDEPENDENT_AMBULATORY_CARE_PROVIDER_SITE_OTHER): Payer: BLUE CROSS/BLUE SHIELD | Admitting: Family Medicine

## 2017-06-12 VITALS — BP 130/79 | HR 67 | Temp 98.3°F | Resp 18 | Ht 65.47 in | Wt 196.2 lb

## 2017-06-12 DIAGNOSIS — Z6832 Body mass index (BMI) 32.0-32.9, adult: Secondary | ICD-10-CM

## 2017-06-12 DIAGNOSIS — R7303 Prediabetes: Secondary | ICD-10-CM

## 2017-06-12 DIAGNOSIS — E6609 Other obesity due to excess calories: Secondary | ICD-10-CM | POA: Diagnosis not present

## 2017-06-12 DIAGNOSIS — I10 Essential (primary) hypertension: Secondary | ICD-10-CM

## 2017-06-12 DIAGNOSIS — E782 Mixed hyperlipidemia: Secondary | ICD-10-CM

## 2017-06-12 HISTORY — DX: Other obesity due to excess calories: E66.09

## 2017-06-12 HISTORY — DX: Mixed hyperlipidemia: E78.2

## 2017-06-12 HISTORY — DX: Body mass index (BMI) 32.0-32.9, adult: Z68.32

## 2017-06-12 HISTORY — DX: Prediabetes: R73.03

## 2017-06-12 LAB — COMPREHENSIVE METABOLIC PANEL
A/G RATIO: 2 (ref 1.2–2.2)
ALT: 27 IU/L (ref 0–44)
AST: 19 IU/L (ref 0–40)
Albumin: 4.5 g/dL (ref 3.6–4.8)
Alkaline Phosphatase: 42 IU/L (ref 39–117)
BILIRUBIN TOTAL: 0.4 mg/dL (ref 0.0–1.2)
BUN / CREAT RATIO: 11 (ref 10–24)
BUN: 10 mg/dL (ref 8–27)
CO2: 23 mmol/L (ref 20–29)
Calcium: 8.9 mg/dL (ref 8.6–10.2)
Chloride: 103 mmol/L (ref 96–106)
Creatinine, Ser: 0.89 mg/dL (ref 0.76–1.27)
GFR, EST AFRICAN AMERICAN: 104 mL/min/{1.73_m2} (ref >59)
GFR, EST NON AFRICAN AMERICAN: 90 mL/min/{1.73_m2} (ref >59)
GLUCOSE: 110 mg/dL — AB (ref 65–99)
Globulin, Total: 2.3 g/dL (ref 1.5–4.5)
POTASSIUM: 3.3 mmol/L — AB (ref 3.5–5.2)
Sodium: 141 mmol/L (ref 134–144)
Total Protein: 6.8 g/dL (ref 6.0–8.5)

## 2017-06-12 LAB — LIPID PANEL
Chol/HDL Ratio: 4.4 ratio (ref 0.0–5.0)
Cholesterol, Total: 206 mg/dL — ABNORMAL HIGH (ref 100–199)
HDL: 47 mg/dL (ref >39)
LDL Calculated: 137 mg/dL — ABNORMAL HIGH (ref 0–99)
Triglycerides: 111 mg/dL (ref 0–149)
VLDL Cholesterol Cal: 22 mg/dL (ref 5–40)

## 2017-06-12 LAB — HEMOGLOBIN A1C
Est. average glucose Bld gHb Est-mCnc: 123 mg/dL
Hgb A1c MFr Bld: 5.9 % — ABNORMAL HIGH (ref 4.8–5.6)

## 2017-06-12 NOTE — Assessment & Plan Note (Signed)
Discussed that if his a1c is increased he will be referred to diabetic education and started on medication. Discussed that a1c of 6.5% is diagnostic

## 2017-06-12 NOTE — Assessment & Plan Note (Signed)
Pt has not regained the weight that he lost. He is making good food choices.

## 2017-06-12 NOTE — Patient Instructions (Addendum)
   IF you received an x-ray today, you will receive an invoice from [facility]. Please contact  Radiology at [phone] with questions or concerns regarding your invoice.   IF you received labwork today, you will receive an invoice from [facility]. Please contact [facility] at [phone] with questions or concerns regarding your invoice.   Our billing staff will not be able to assist you with questions regarding bills from these companies.  You will be contacted with the lab results as soon as they are available. The fastest way to get your results is to activate your My Chart account. Instructions are located on the last page of this paperwork. If you have not heard from us regarding the results in 2 weeks, please contact this office.     DASH Eating Plan DASH stands for "Dietary Approaches to Stop Hypertension." The DASH eating plan is a healthy eating plan that has been shown to reduce high blood pressure (hypertension). It may also reduce your risk for type 2 diabetes, heart disease, and stroke. The DASH eating plan may also help with weight loss. What are tips for following this plan? General guidelines   Avoid eating more than 2,300 mg (milligrams) of salt (sodium) a day. If you have hypertension, you may need to reduce your sodium intake to 1,500 mg a day.  Limit alcohol intake to no more than 1 drink a day for nonpregnant women and 2 drinks a day for men. One drink equals 12 oz of beer, 5 oz of wine, or 1 oz of hard liquor.  Work with your health care provider to maintain a healthy body weight or to lose weight. Ask what an ideal weight is for you.  Get at least 30 minutes of exercise that causes your heart to beat faster (aerobic exercise) most days of the week. Activities may include walking, swimming, or biking.  Work with your health care provider or diet and nutrition specialist (dietitian) to adjust your eating plan to your individual calorie  needs. Reading food labels   Check food labels for the amount of sodium per serving. Choose foods with less than 5 percent of the Daily Value of sodium. Generally, foods with less than 300 mg of sodium per serving fit into this eating plan.  To find whole grains, look for the word "whole" as the first word in the ingredient list. Shopping   Buy products labeled as "low-sodium" or "no salt added."  Buy fresh foods. Avoid canned foods and premade or frozen meals. Cooking   Avoid adding salt when cooking. Use salt-free seasonings or herbs instead of table salt or sea salt. Check with your health care provider or pharmacist before using salt substitutes.  Do not fry foods. Cook foods using healthy methods such as baking, boiling, grilling, and broiling instead.  Cook with heart-healthy oils, such as olive, canola, soybean, or sunflower oil. Meal planning    Eat a balanced diet that includes:  5 or more servings of fruits and vegetables each day. At each meal, try to fill half of your plate with fruits and vegetables.  Up to 6-8 servings of whole grains each day.  Less than 6 oz of lean meat, poultry, or fish each day. A 3-oz serving of meat is about the same size as a deck of cards. One egg equals 1 oz.  2 servings of low-fat dairy each day.  A serving of nuts, seeds, or beans 5 times each week.  Heart-healthy fats. Healthy fats called   Omega-3 fatty acids are found in foods such as flaxseeds and coldwater fish, like sardines, salmon, and mackerel.  Limit how much you eat of the following:  Canned or prepackaged foods.  Food that is high in trans fat, such as fried foods.  Food that is high in saturated fat, such as fatty meat.  Sweets, desserts, sugary drinks, and other foods with added sugar.  Full-fat dairy products.  Do not salt foods before eating.  Try to eat at least 2 vegetarian meals each week.  Eat more home-cooked food and less restaurant, buffet, and fast  food.  When eating at a restaurant, ask that your food be prepared with less salt or no salt, if possible. What foods are recommended? The items listed may not be a complete list. Talk with your dietitian about what dietary choices are best for you. Grains  Whole-grain or whole-wheat bread. Whole-grain or whole-wheat pasta. Brown rice. Oatmeal. Quinoa. Bulgur. Whole-grain and low-sodium cereals. Pita bread. Low-fat, low-sodium crackers. Whole-wheat flour tortillas. Vegetables  Fresh or frozen vegetables (raw, steamed, roasted, or grilled). Low-sodium or reduced-sodium tomato and vegetable juice. Low-sodium or reduced-sodium tomato sauce and tomato paste. Low-sodium or reduced-sodium canned vegetables. Fruits  All fresh, dried, or frozen fruit. Canned fruit in natural juice (without added sugar). Meat and other protein foods  Skinless chicken or turkey. Ground chicken or turkey. Pork with fat trimmed off. Fish and seafood. Egg whites. Dried beans, peas, or lentils. Unsalted nuts, nut butters, and seeds. Unsalted canned beans. Lean cuts of beef with fat trimmed off. Low-sodium, lean deli meat. Dairy  Low-fat (1%) or fat-free (skim) milk. Fat-free, low-fat, or reduced-fat cheeses. Nonfat, low-sodium ricotta or cottage cheese. Low-fat or nonfat yogurt. Low-fat, low-sodium cheese. Fats and oils  Soft margarine without trans fats. Vegetable oil. Low-fat, reduced-fat, or light mayonnaise and salad dressings (reduced-sodium). Canola, safflower, olive, soybean, and sunflower oils. Avocado. Seasoning and other foods  Herbs. Spices. Seasoning mixes without salt. Unsalted popcorn and pretzels. Fat-free sweets. What foods are not recommended? The items listed may not be a complete list. Talk with your dietitian about what dietary choices are best for you. Grains  Baked goods made with fat, such as croissants, muffins, or some breads. Dry pasta or rice meal packs. Vegetables  Creamed or fried vegetables.  Vegetables in a cheese sauce. Regular canned vegetables (not low-sodium or reduced-sodium). Regular canned tomato sauce and paste (not low-sodium or reduced-sodium). Regular tomato and vegetable juice (not low-sodium or reduced-sodium). Pickles. Olives. Fruits  Canned fruit in a light or heavy syrup. Fried fruit. Fruit in cream or butter sauce. Meat and other protein foods  Fatty cuts of meat. Ribs. Fried meat. Bacon. Sausage. Bologna and other processed lunch meats. Salami. Fatback. Hotdogs. Bratwurst. Salted nuts and seeds. Canned beans with added salt. Canned or smoked fish. Whole eggs or egg yolks. Chicken or turkey with skin. Dairy  Whole or 2% milk, cream, and half-and-half. Whole or full-fat cream cheese. Whole-fat or sweetened yogurt. Full-fat cheese. Nondairy creamers. Whipped toppings. Processed cheese and cheese spreads. Fats and oils  Butter. Stick margarine. Lard. Shortening. Ghee. Bacon fat. Tropical oils, such as coconut, palm kernel, or palm oil. Seasoning and other foods  Salted popcorn and pretzels. Onion salt, garlic salt, seasoned salt, table salt, and sea salt. Worcestershire sauce. Tartar sauce. Barbecue sauce. Teriyaki sauce. Soy sauce, including reduced-sodium. Steak sauce. Canned and packaged gravies. Fish sauce. Oyster sauce. Cocktail sauce. Horseradish that you find on the shelf. Ketchup. Mustard. Meat flavorings   and tenderizers. Bouillon cubes. Hot sauce and Tabasco sauce. Premade or packaged marinades. Premade or packaged taco seasonings. Relishes. Regular salad dressings. Where to find more information:  National Heart, Lung, and Blood Institute: www.nhlbi.nih.gov  American Heart Association: www.heart.org Summary  The DASH eating plan is a healthy eating plan that has been shown to reduce high blood pressure (hypertension). It may also reduce your risk for type 2 diabetes, heart disease, and stroke.  With the DASH eating plan, you should limit salt (sodium) intake  to 2,300 mg a day. If you have hypertension, you may need to reduce your sodium intake to 1,500 mg a day.  When on the DASH eating plan, aim to eat more fresh fruits and vegetables, whole grains, lean proteins, low-fat dairy, and heart-healthy fats.  Work with your health care provider or diet and nutrition specialist (dietitian) to adjust your eating plan to your individual calorie needs. This information is not intended to replace advice given to you by your health care provider. Make sure you discuss any questions you have with your health care provider. Document Released: 11/23/2011 Document Revised: 11/27/2016 Document Reviewed: 11/27/2016 Elsevier Interactive Patient Education  2017 Elsevier Inc.  

## 2017-06-12 NOTE — Assessment & Plan Note (Signed)
bp is well controlled on current medications. No side effects. Will check renal function.

## 2017-06-12 NOTE — Assessment & Plan Note (Signed)
Discussed lipid goals

## 2017-06-12 NOTE — Progress Notes (Signed)
Chief Complaint  Patient presents with  . Hypertension    f/u    HPI   Hypertension: Patient here for follow-up of elevated blood pressure. He is exercising and is adherent to low salt diet.  Blood pressure is well controlled at home. Cardiac symptoms none. Patient denies chest pain, chest pressure/discomfort, claudication, exertional chest pressure/discomfort, irregular heart beat and lower extremity edema.  Cardiovascular risk factors: dyslipidemia, hypertension and male gender.  BP Readings from Last 3 Encounters:  06/12/17 130/79  04/17/17 128/60  03/13/17 (!) 141/75   He is taking hyzaar and amlodipine bp monitoring at work and his readings are good He avoids salty foods  Lab Results  Component Value Date   CREATININE 1.04 01/13/2017    Hyperlipidemia: Patient presents with hyperlipidemia.  He was tested because of screening.  His last labs showed Total cholesterol see below. negative. There is not a family history of hyperlipidemia. There is not a family history of early ischemia heart disease.  Lab Results  Component Value Date   CHOL 156 08/25/2016   CHOL 152 03/12/2015   CHOL 174 02/24/2014   Lab Results  Component Value Date   HDL 56 08/25/2016   HDL 52 03/12/2015   HDL 45 02/24/2014   Lab Results  Component Value Date   LDLCALC 82 08/25/2016   LDLCALC 82 03/12/2015   LDLCALC 85 02/24/2014   Lab Results  Component Value Date   TRIG 88 08/25/2016   TRIG 92 03/12/2015   TRIG 222 (H) 02/24/2014   Lab Results  Component Value Date   CHOLHDL 2.8 08/25/2016   CHOLHDL 2.9 03/12/2015   CHOLHDL 3.9 02/24/2014   No results found for: LDLDIRECT  Past Medical History:  Diagnosis Date  . HLD (hyperlipidemia)   . HTN (hypertension)     Current Outpatient Prescriptions  Medication Sig Dispense Refill  . amLODipine (NORVASC) 10 MG tablet Take 1 tablet (10 mg total) by mouth daily. 90 tablet 1  . aspirin 81 MG tablet Take 81 mg by mouth daily.    .  cyclobenzaprine (FLEXERIL) 10 MG tablet Take 1 tablet (10 mg total) by mouth 3 (three) times daily as needed for muscle spasms. 30 tablet 0  . fluticasone (FLONASE) 50 MCG/ACT nasal spray Place 2 sprays into both nostrils daily. 48 g 1  . gabapentin (NEURONTIN) 100 MG capsule Take 1 capsule (100 mg total) by mouth 3 (three) times daily. 90 capsule 3  . losartan-hydrochlorothiazide (HYZAAR) 100-25 MG tablet Take 1 tablet by mouth daily. 90 tablet 1  . meloxicam (MOBIC) 15 MG tablet Take 1 tablet (15 mg total) by mouth daily. 14 tablet 0  . potassium chloride SA (K-DUR,KLOR-CON) 20 MEQ tablet TAKE 1 TABLET BY MOUTH TWICE DAILY 60 tablet 0  . rosuvastatin (CRESTOR) 20 MG tablet TAKE 1 BY MOUTH DAILY 90 tablet 1  . tamsulosin (FLOMAX) 0.4 MG CAPS capsule Take 1 capsule (0.4 mg total) by mouth daily. Return for evaluation and labs prior to further refills. 30 capsule 0   No current facility-administered medications for this visit.     Allergies: No Known Allergies  Past Surgical History:  Procedure Laterality Date  . NECK SURGERY  May 19 2014  . none      Social History   Social History  . Marital status: Married    Spouse name: N/A  . Number of children: 2  . Years of education: N/A   Occupational History  . MANAGEMENT [employer]   Social  History Main Topics  . Smoking status: Never Smoker  . Smokeless tobacco: Current User    Types: Snuff  . Alcohol use 7.2 oz/week    2 Standard drinks or equivalent, 6 Cans of beer, 4 Shots of liquor per week  . Drug use: No  . Sexual activity: Not Asked   Other Topics Concern  . None   Social History Narrative  . None    ROS Review of Systems See HPI Constitution: No fevers or chills No malaise No diaphoresis Skin: No rash or itching Eyes: no blurry vision, no double vision GU: no dysuria or hematuria Neuro: no dizziness or headaches  Objective: Vitals:   06/12/17 0936  BP: 130/79  Pulse: 67  Resp: 18  Temp: 98.3  F (36.8 C)  TempSrc: Oral  SpO2: 97%  Weight: 196 lb 3.2 oz (89 kg)  Height: 5' 5.47" (1.663 m)   Wt Readings from Last 3 Encounters:  06/12/17 196 lb 3.2 oz (89 kg)  04/17/17 195 lb (88.5 kg)  03/13/17 201 lb (91.2 kg)   Body mass index is 32.18 kg/m.   Physical Exam  Constitutional: He is oriented to person, place, and time. He appears well-developed and well-nourished.  HENT:  Head: Normocephalic and atraumatic.  Eyes: Conjunctivae and EOM are normal.  Cardiovascular: Normal rate, regular rhythm and normal heart sounds.   Pulmonary/Chest: Effort normal and breath sounds normal. No respiratory distress. He has no wheezes.  Neurological: He is alert and oriented to person, place, and time.  Psychiatric: He has a normal mood and affect. His behavior is normal. Judgment and thought content normal.    Assessment and Plan Sayvon was seen today for hypertension.  Diagnoses and all orders for this visit:  Essential hypertension -     Lipid panel -     Comprehensive metabolic panel  Mixed hyperlipidemia -     Lipid panel -     Comprehensive metabolic panel  Class 1 obesity due to excess calories without serious comorbidity with body mass index (BMI) of 32.0 to 32.9 in adult -     Lipid panel -     Comprehensive metabolic panel -     Hemoglobin A1c  Prediabetes   Problem List Items Addressed This Visit      Cardiovascular and Mediastinum   HTN (hypertension) - Primary    bp is well controlled on current medications. No side effects. Will check renal function.      Relevant Orders   Lipid panel   Comprehensive metabolic panel     Other   Mixed hyperlipidemia    Discussed lipid goals      Relevant Orders   Lipid panel   Comprehensive metabolic panel   Class 1 obesity due to excess calories without serious comorbidity with body mass index (BMI) of 32.0 to 32.9 in adult    Pt has not regained the weight that he lost. He is making good food choices.       Relevant Orders   Lipid panel   Comprehensive metabolic panel   Hemoglobin A1c   Prediabetes    Discussed that if his a1c is increased he will be referred to diabetic education and started on medication. Discussed that a1c of 6.5% is diagnostic          Raelynne Ludwick A Nolon Rod

## 2017-06-21 ENCOUNTER — Telehealth: Payer: Self-pay | Admitting: General Practice

## 2017-06-21 NOTE — Telephone Encounter (Signed)
Pt pharmacy [facility] [address] is calling is needing a refill on crestor and losartin

## 2017-06-23 NOTE — Telephone Encounter (Signed)
Called and spoke with pharmacist, to let them know that patient has refills on both meds.

## 2017-07-02 ENCOUNTER — Ambulatory Visit: Payer: Self-pay | Admitting: Cardiology

## 2017-07-02 NOTE — Progress Notes (Deleted)
Cardiology Office Note:    Date:  07/02/2017   ID:  Bryce Rodriguez, DOB 10/27/1951, MRN [id number]  PCP:  Patient, No Pcp Per  Cardiologist:  Candee Furbish, MD   Referring MD: Forrest Moron, MD   No chief complaint on file. ***  History of Present Illness:    Bryce Rodriguez is a 66 y.o. male with a hx of HTN, HL, obesity, prediabetes here for the evaluation of chest pain at the request of Dr. Nolon Rod.   Past Medical History:  Diagnosis Date  . Arthritis 02/24/2014  . Class 1 obesity due to excess calories without serious comorbidity with body mass index (BMI) of 32.0 to 32.9 in adult 06/12/2017  . Environmental allergies 02/06/2017  . HLD (hyperlipidemia)   . HTN (hypertension)   . Hypercholesteremia 01/23/2012  . Mixed hyperlipidemia 06/12/2017  . Numbness and tingling in hands 05/01/2014   04/17/2014. [place].  Dorna Leitz, MD.  Radiographs: These do reveal obvious degenerative disc disease at C5-6. There is no instability noted. Plan: We will proceed with getting surgery set up at patient's earliest convenience.   . Prediabetes 06/12/2017    Past Surgical History:  Procedure Laterality Date  . NECK SURGERY  May 19 2014  . none      Current Medications: No outpatient prescriptions have been marked as taking for the 07/02/17 encounter (Appointment) with Jerline Pain, MD.     Allergies:   Patient has no known allergies.   Social History   Social History  . Marital status: Married    Spouse name: N/A  . Number of children: 2  . Years of education: N/A   Occupational History  . 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 Smoking status: Never Smoker  . Smokeless tobacco: Current User    Types: Snuff  . Alcohol use 7.2 oz/week    2 Standard drinks or equivalent, 6 Cans of beer, 4 Shots of liquor per week  . Drug use: No  . Sexual activity: Not on file   Other Topics Concern  . Not on file   Social  History Narrative  . No narrative on file     Family History: The patient's ***family history includes Cancer in his unknown relative; Hypertension in his unknown relative. ROS:   Please see the history of present illness.    *** All other systems reviewed and are negative.  EKGs/Labs/Other Studies Reviewed:    The following studies were reviewed today: ***  EKG:  EKG is *** ordered today.  The ekg ordered today demonstrates ***  Recent Labs: 01/13/2017: Platelets 234 03/13/2017: Hemoglobin 13.2 06/12/2017: ALT 27; BUN 10; Creatinine, Ser 0.89; Potassium 3.3; Sodium 141  Recent Lipid Panel    Component Value Date/Time   CHOL 206 (H) 06/12/2017 1004   TRIG 111 06/12/2017 1004   HDL 47 06/12/2017 1004   CHOLHDL 4.4 06/12/2017 1004   CHOLHDL 2.8 08/25/2016 1119   VLDL 18 08/25/2016 1119   LDLCALC 137 (H) 06/12/2017 1004    Physical Exam:    VS:  There were no vitals taken for this visit.    Wt Readings from Last 3 Encounters:  06/12/17 196 lb 3.2 oz (89 kg)  04/17/17 195 lb (88.5 kg)  03/13/17 201 lb (91.2 kg)     GEN: *** Well nourished, well developed in no acute distress HEENT: Normal NECK: No JVD; No carotid bruits LYMPHATICS: No lymphadenopathy CARDIAC: ***RRR,  no murmurs, rubs, gallops RESPIRATORY:  Clear to auscultation without rales, wheezing or rhonchi  ABDOMEN: Soft, non-tender, non-distended MUSCULOSKELETAL:  No edema; No deformity  SKIN: Warm and dry NEUROLOGIC:  Alert and oriented x 3 PSYCHIATRIC:  Normal affect   ASSESSMENT:    No diagnosis found. PLAN:    In order of problems listed above:  1. ***   Medication Adjustments/Labs and Tests Ordered: Current medicines are reviewed at length with the patient today.  Concerns regarding medicines are outlined above.  No orders of the defined types were placed in this encounter.  No orders of the defined types were placed in this encounter.   Signed, Candee Furbish, MD  07/02/2017 6:29 AM    [facility]

## 2017-07-04 ENCOUNTER — Encounter: Payer: Self-pay | Admitting: Cardiology

## 2017-12-09 ENCOUNTER — Other Ambulatory Visit: Payer: Self-pay | Admitting: Family Medicine

## 2017-12-09 DIAGNOSIS — I10 Essential (primary) hypertension: Secondary | ICD-10-CM

## 2017-12-19 ENCOUNTER — Other Ambulatory Visit: Payer: Self-pay | Admitting: Family Medicine

## 2017-12-19 DIAGNOSIS — I10 Essential (primary) hypertension: Secondary | ICD-10-CM

## 2018-01-31 ENCOUNTER — Other Ambulatory Visit: Payer: Self-pay

## 2018-01-31 ENCOUNTER — Ambulatory Visit (INDEPENDENT_AMBULATORY_CARE_PROVIDER_SITE_OTHER): Payer: Medicare HMO | Admitting: Family Medicine

## 2018-01-31 ENCOUNTER — Encounter: Payer: Self-pay | Admitting: Family Medicine

## 2018-01-31 ENCOUNTER — Other Ambulatory Visit: Payer: Self-pay | Admitting: Family Medicine

## 2018-01-31 VITALS — BP 120/70 | HR 82 | Temp 98.9°F | Resp 16 | Ht 66.0 in | Wt 201.2 lb

## 2018-01-31 DIAGNOSIS — R350 Frequency of micturition: Secondary | ICD-10-CM

## 2018-01-31 DIAGNOSIS — Z13220 Encounter for screening for lipoid disorders: Secondary | ICD-10-CM

## 2018-01-31 DIAGNOSIS — Z0000 Encounter for general adult medical examination without abnormal findings: Secondary | ICD-10-CM | POA: Diagnosis not present

## 2018-01-31 DIAGNOSIS — Z23 Encounter for immunization: Secondary | ICD-10-CM

## 2018-01-31 DIAGNOSIS — N401 Enlarged prostate with lower urinary tract symptoms: Secondary | ICD-10-CM | POA: Diagnosis not present

## 2018-01-31 DIAGNOSIS — I10 Essential (primary) hypertension: Secondary | ICD-10-CM

## 2018-01-31 DIAGNOSIS — Z125 Encounter for screening for malignant neoplasm of prostate: Secondary | ICD-10-CM

## 2018-01-31 LAB — POCT URINALYSIS DIP (MANUAL ENTRY)
BILIRUBIN UA: NEGATIVE mg/dL
Bilirubin, UA: NEGATIVE
Blood, UA: NEGATIVE
Glucose, UA: NEGATIVE mg/dL
Leukocytes, UA: NEGATIVE
NITRITE UA: NEGATIVE
PH UA: 7 (ref 5.0–8.0)
PROTEIN UA: NEGATIVE mg/dL
Spec Grav, UA: 1.015 (ref 1.010–1.025)
UROBILINOGEN UA: 0.2 U/dL

## 2018-01-31 MED ORDER — AMLODIPINE BESYLATE 10 MG PO TABS: 10.0000 mg | ORAL_TABLET | Freq: Every day | ORAL | 1 refills | Status: DC

## 2018-01-31 MED ORDER — POTASSIUM CHLORIDE CRYS ER 20 MEQ PO TBCR: 20.0000 meq | EXTENDED_RELEASE_TABLET | Freq: Two times a day (BID) | ORAL | 1 refills | Status: DC

## 2018-01-31 MED ORDER — LOSARTAN POTASSIUM-HCTZ 100-25 MG PO TABS: 1.0000 | ORAL_TABLET | Freq: Every day | ORAL | 1 refills | Status: DC

## 2018-01-31 MED ORDER — ROSUVASTATIN CALCIUM 20 MG PO TABS: ORAL_TABLET | ORAL | 1 refills | Status: DC

## 2018-01-31 NOTE — Patient Instructions (Addendum)
IF you received an x-ray today, you will receive an invoice from [facility]. Please contact [facility] at [phone] with questions or concerns regarding your invoice.   IF you received labwork today, you will receive an invoice from [facility]. Please contact [facility] at [phone] with questions or concerns regarding your invoice.   Our billing staff will not be able to assist you with questions regarding bills from these companies.  You will be contacted with the lab results as soon as they are available. The fastest way to get your results is to activate your My Chart account. Instructions are located on the last page of this paperwork. If you have not heard from [place] regarding the results in 2 weeks, please contact this office.     Benign Prostatic Hyperplasia Benign prostatic hyperplasia (BPH) is an enlarged prostate gland that is caused by the normal aging process and not by cancer. The prostate is a walnut-sized gland that is involved in the production of semen. It is located in front of the rectum and below the bladder. The bladder stores urine and the urethra is the tube that carries the urine out of the body. The prostate may get bigger as a man gets older. An enlarged prostate can press on the urethra. This can make it harder to pass urine. The build-up of urine in the bladder can cause infection. Back pressure and infection may progress to bladder damage and kidney (renal) failure. What are the causes? This condition is part of a normal aging process. However, not all men develop problems from this condition. If the prostate enlarges away from the urethra, urine flow will not be blocked. If it enlarges toward the urethra and compresses it, there will be problems passing urine. What increases the risk? This condition is more likely to develop in men over the age of 43 years. What are the signs or symptoms? Symptoms of this condition include:  Getting up  often during the night to urinate.  Needing to urinate frequently during the day.  Difficulty starting urine flow.  Decrease in size and strength of your urine stream.  Leaking (dribbling) after urinating.  Inability to pass urine. This needs immediate treatment.  Inability to completely empty your bladder.  Pain when you pass urine. This is more common if there is also an infection.  Urinary tract infection (UTI).  How is this diagnosed? This condition is diagnosed based on your medical history, a physical exam, and your symptoms. Tests will also be done, such as:  A post-void bladder scan. This measures any amount of urine that may remain in your bladder after you finish urinating.  A digital rectal exam. In a rectal exam, your health care provider checks your prostate by putting a lubricated, gloved finger into your rectum to feel the back of your prostate gland. This exam detects the size of your gland and any abnormal lumps or growths.  An exam of your urine (urinalysis).  A prostate specific antigen (PSA) screening. This is a blood test used to screen for prostate cancer.  An ultrasound. This test uses sound waves to electronically produce a picture of your prostate gland.  Your health care provider may refer you to a specialist in kidney and prostate diseases (urologist). How is this treated? Once symptoms begin, your health care provider will monitor your condition (active surveillance or watchful waiting). Treatment for this condition will depend on the severity of your condition. Treatment may include:  Observation and  yearly exams. This may be the only treatment needed if your condition and symptoms are mild.  Medicines to relieve your symptoms, including: ? Medicines to shrink the prostate. ? Medicines to relax the muscle of the prostate.  Surgery in severe cases. Surgery may include: ? Prostatectomy. In this procedure, the prostate tissue is removed completely  through an open incision or with a laparascope or robotics. ? Transurethral resection of the prostate (TURP). In this procedure, a tool is inserted through the opening at the tip of the penis (urethra). It is used to cut away tissue of the inner core of the prostate. The pieces are removed through the same opening of the penis. This removes the blockage. ? Transurethral incision (TUIP). In this procedure, small cuts are made in the prostate. This lessens the prostate's pressure on the urethra. ? Transurethral microwave thermotherapy (TUMT). This procedure uses microwaves to create heat. The heat destroys and removes a small amount of prostate tissue. ? Transurethral needle ablation (TUNA). This procedure uses radio frequencies to destroy and remove a small amount of prostate tissue. ? Interstitial laser coagulation ([place]). This procedure uses a laser to destroy and remove a small amount of prostate tissue. ? Transurethral electrovaporization (TUVP). This procedure uses electrodes to destroy and remove a small amount of prostate tissue. ? Prostatic urethral lift. This procedure inserts an implant to push the lobes of the prostate away from the urethra.  Follow these instructions at home:  Take over-the-counter and prescription medicines only as told by your health care provider.  Monitor your symptoms for any changes. Contact your health care provider with any changes.  Avoid drinking large amounts of liquid before going to bed or out in public.  Avoid or reduce how much caffeine or alcohol you drink.  Give yourself time when you urinate.  Keep all follow-up visits as told by your health care provider. This is important. Contact a health care provider if:  You have unexplained back pain.  Your symptoms do not get better with treatment.  You develop side effects from the medicine you are taking.  Your urine becomes very dark or has a bad smell.  Your lower abdomen becomes distended and  you have trouble passing your urine. Get help right away if:  You have a fever or chills.  You suddenly cannot urinate.  You feel lightheaded, or very dizzy, or you faint.  There are large amounts of blood or clots in the urine.  Your urinary problems become hard to manage.  You develop moderate to severe low back or flank pain. The flank is the side of your body between the ribs and the hip. These symptoms may represent a serious problem that is an emergency. Do not wait to see if the symptoms will go away. Get medical help right away. Call your local emergency services (911 in the [place] Do not drive yourself to the hospital. Summary  Benign prostatic hyperplasia (BPH) is an enlarged prostate that is caused by the normal aging process and not by cancer.  An enlarged prostate can press on the urethra. This can make it hard to pass urine.  This condition is part of a normal aging process and is more likely to develop in men over the age of 23 years.  Get help right away if you suddenly cannot urinate. This information is not intended to replace advice given to you by your health care provider. Make sure you discuss any questions you have with your health care  provider. Document Released: 12/04/2005 Document Revised: 01/08/2017 Document Reviewed: 01/08/2017 Elsevier Interactive Patient Education  [employer].

## 2018-01-31 NOTE — Progress Notes (Signed)
QUICK REFERENCE INFORMATION:  The ABCs of Providing the Initial Preventive Physical Examination CMS.gov Medicare Learning Network  Welcome To Medicare Physical Bryce Rodriguez is a 67 y.o. male who presents for a Welcome to Medicare exam.   Patient Active Problem List   Diagnosis Date Noted  . Mixed hyperlipidemia 06/12/2017  . Class 1 obesity due to excess calories without serious comorbidity with body mass index (BMI) of 32.0 to 32.9 in adult 06/12/2017  . Prediabetes 06/12/2017  . Environmental allergies 02/06/2017  . Numbness and tingling in hands 05/01/2014  . Arthritis 02/24/2014  . HTN (hypertension) 01/23/2012  . Hypercholesteremia 01/23/2012    Past Medical History:  Diagnosis Date  . Arthritis 02/24/2014  . Class 1 obesity due to excess calories without serious comorbidity with body mass index (BMI) of 32.0 to 32.9 in adult 06/12/2017  . Environmental allergies 02/06/2017  . HLD (hyperlipidemia)   . HTN (hypertension)   . Hypercholesteremia 01/23/2012  . Mixed hyperlipidemia 06/12/2017  . Numbness and tingling in hands 05/01/2014   04/17/2014. Bryce Rodriguez.  Dorna Leitz, MD.  Radiographs: These do reveal obvious degenerative disc disease at C5-6. There is no instability noted. Plan: We will proceed with getting surgery set up at patient's earliest convenience.   . Prediabetes 06/12/2017     Past Surgical History:  Procedure Laterality Date  . NECK SURGERY  May 19 2014  . none       Outpatient Medications Prior to Visit  Medication Sig Dispense Refill  . amLODipine (NORVASC) 10 MG tablet Take 1 tablet (10 mg total) by mouth daily. Office visit needed 90 tablet 0  . aspirin 81 MG tablet Take 81 mg by mouth daily.    . cyclobenzaprine (FLEXERIL) 10 MG tablet Take 1 tablet (10 mg total) by mouth 3 (three) times daily as needed for muscle spasms. 30 tablet 0  . fluticasone (FLONASE) 50 MCG/ACT nasal spray Place 2 sprays into both nostrils  daily. 48 g 1  . losartan-hydrochlorothiazide (HYZAAR) 100-25 MG tablet TAKE 1 TABLET BY MOUTH EVERY DAY 90 tablet 1  . potassium chloride SA (K-DUR,KLOR-CON) 20 MEQ tablet TAKE 1 TABLET BY MOUTH TWICE DAILY 60 tablet 0  . rosuvastatin (CRESTOR) 20 MG tablet TAKE 1 TABLET BY MOUTH EVERY DAY 90 tablet 1  . tamsulosin (FLOMAX) 0.4 MG CAPS capsule Take 1 capsule (0.4 mg total) by mouth daily. Return for evaluation and labs prior to further refills. 30 capsule 0  . gabapentin (NEURONTIN) 100 MG capsule Take 1 capsule (100 mg total) by mouth 3 (three) times daily. (Patient not taking: Reported on 01/31/2018) 90 capsule 3  . meloxicam (MOBIC) 15 MG tablet Take 1 tablet (15 mg total) by mouth daily. (Patient not taking: Reported on 01/31/2018) 14 tablet 0   No facility-administered medications prior to visit.     No Known Allergies   Family History  Problem Relation Age of Onset  . Cancer Unknown   . Hypertension Unknown      Social History   Socioeconomic History  . Marital status: Married    Spouse name: None  . Number of children: 2  . Years of education: None  . Highest education level: None  Social Needs  . Financial resource strain: None  . Food insecurity - worry: None  . Food insecurity - inability: None  . Transportation needs - medical: None  . Transportation needs - non-medical: None  Occupational History  . Occupation: MANAGEMENT  Employer: [employer]  Tobacco Use  . Smoking status: Never Smoker  . Smokeless tobacco: Current User    Types: Snuff  Substance and Sexual Activity  . Alcohol use: Yes    Alcohol/week: 7.2 oz    Types: 6 Cans of beer, 4 Shots of liquor, 2 Standard drinks or equivalent per week  . Drug use: No  . Sexual activity: None  Other Topics Concern  . None  Social History Narrative  . None     Review of Systems  Constitutional: Negative for chills, fever and weight loss.  Respiratory: Negative for cough, shortness of breath and  wheezing.   Cardiovascular: Positive for chest pain. Negative for palpitations and orthopnea.       Sporadic chest pains lasting less than a minute   Gastrointestinal: Negative for abdominal pain, nausea and vomiting.  Genitourinary: Positive for frequency and urgency. Negative for dysuria, flank pain and hematuria.       Pt reports that he has a weak stream but denies incontinence. He feels like he has to go all the time. He feels fullness and pressure in his bladder.  Skin: Negative for itching and rash.  Neurological: Negative for dizziness, tingling and headaches.  Psychiatric/Behavioral: Negative for depression. The patient is not nervous/anxious.     Recent Hospitalizations? no  Current Medical Providers and Suppliers: [facility] Patient Care Team: Patient, No Pcp Per as PCP - General (General Practice) Future Appointments  Date Time Provider [place]  05/01/2018  8:00 AM Forrest Moron, MD PCP-PCP [facility]     Age-appropriate Screening Schedule: The list below includes current immunization status and future screening recommendations based on patient's age. Orders for these recommended tests are listed in the plan section. The patient has been provided with a written plan. Immunization History  Administered Date(s) Administered  . Influenza Split 09/18/2012  . Influenza-Unspecified 09/18/2012, 10/24/2014, 08/18/2017  . Pneumococcal Conjugate-13 01/31/2018   Health Maintenance  Topic Date Due  . HIV Screening  08/29/1966  . INFLUENZA VACCINE  07/18/2017  . TETANUS/TDAP  01/31/2019 (Originally 08/29/1970)  . PNA vac Low Risk Adult (1 of 2 - PCV13) 01/31/2019 (Originally 08/28/2017)  . COLONOSCOPY  04/22/2018  . Hepatitis C Screening  Completed    Health Habits  Exercise: keeps busy but dedicated exercise Current exercise activities include: walking Diet: in general, a "healthy" diet    Alcohol: 2-3 drinks a week  Depression Screen-PHQ2/9 completed  today PHQ-2 Depression screen [facility] 2/9 01/31/2018 06/12/2017 04/17/2017 03/13/2017 01/13/2017  Decreased Interest 0 0 0 0 0  Down, Depressed, Hopeless 0 0 0 0 0  PHQ - 2 Score 0 0 0 0 0     Depression Severity and Treatment Recommendations:  0-4= None  5-9= Mild / Treatment: Support, educate to call if worse; return in one month  10-14= Moderate / Treatment: Support, watchful waiting; Antidepressant or Psycotherapy  15-19= Moderately severe / Treatment: Antidepressant OR Psychotherapy  >= 20 = Major depression, severe / Antidepressant AND Psychotherapy   Functional Status Survey: Is the patient deaf or have difficulty hearing?: No Does the patient have difficulty seeing, even when wearing glasses/contacts?: No Does the patient have difficulty concentrating, remembering, or making decisions?: No Does the patient have difficulty walking or climbing stairs?: No Does the patient have difficulty dressing or bathing?: No Does the patient have difficulty doing errands alone such as visiting a doctor's office or shopping?: No    Falls Risk:  Does the patient need assistance with ambulation?  no  Does the patient have a history of a fall in the last 90 days? no Is the patient at risk for falls? no Was the patient's timed "Get Up and Go Test" unsteady or longer than 30 seconds? no   Advanced Care Planning Patient has executed an Advance Directive: no  If no, patient was given the opportunity to execute an Advance Directive today? yes  Are the patient's advanced directives in [place]? no  This patient has the ability to prepare an Advance Directive: yes Provider is willing to follow the patient's wishes: yes   Cognitive Assessment Does the patient have evidence of cognitive impairment? no The patient does not have evidence of a change in mood/affect, appearance,  speech, memory or motor skills.     Objective:   Vitals:   01/31/18 1104  BP: 120/70  Pulse: 82  Resp: 16  Temp: 98.9 F  (37.2 C)  TempSrc: Oral  SpO2: 98%  Weight: 201 lb 3.2 oz (91.3 kg)  Height: 5\' 6"  (1.676 m)    Body mass index is 32.47 kg/m.   Hearing/Vision exam:  Visual Acuity Screening   Right eye Left eye Both eyes  Without correction: 20/40 20/70 20/30   With correction:       Physical Exam  Constitutional: He is oriented to person, place, and time. He appears well-developed and well-nourished.  HENT:  Head: Normocephalic and atraumatic.  Eyes: Conjunctivae and EOM are normal.  Neck: Normal range of motion. No thyromegaly present.  Cardiovascular: Normal rate, regular rhythm and normal heart sounds.  Pulmonary/Chest: Effort normal and breath sounds normal. No stridor. No respiratory distress. He has no wheezes.  Abdominal: Soft. Bowel sounds are normal. He exhibits no distension. There is no tenderness. There is no guarding.  Genitourinary: Rectum normal.  Genitourinary Comments: Enlarged but smooth, nontender  Neurological: He is alert and oriented to person, place, and time.  Skin: Skin is warm. Capillary refill takes less than 2 seconds.  Psychiatric: He has a normal mood and affect. His behavior is normal. Judgment and thought content normal.    Assessment:  Welcome To Medicare Exam     Plan:  During the course of the visit the patient was educated and counseled about appropriate screening and preventive services including:  Bryce Rodriguez was seen today for annual exam and medication refill.  Diagnoses and all orders for this visit:  Welcome to Medicare preventive visit- discussed screening for labs for heart disease prevention Will get EKG and pneumonia today -     EKG 12-Lead -     Pneumococcal conjugate vaccine 13-valent IM  Need for prophylactic vaccination against Streptococcus pneumoniae (pneumococcus) -     Pneumococcal conjugate vaccine 13-valent IM  Special screening for malignant neoplasm of prostate- based on age and race, discussed risk factors -     PSA  Benign  prostatic hyperplasia with urinary frequency- discussed his previous psa from 2018 Discussed that will refer to Urology if psa is elevated If psa normal plan to have pt try flomax -     PSA -     POCT urinalysis dipstick  Screening, lipid -     Lipid panel -     Comprehensive metabolic panel      Discussed the patient's BMI with him. The BMI BMI is not in the acceptable range; BMI management plan is completed    The following orders were placed at today's visit;  Orders Placed This Encounter  Procedures  . Pneumococcal conjugate vaccine  13-valent IM  . Lipid panel  . PSA  . Comprehensive metabolic panel  . POCT urinalysis dipstick  . EKG 12-Lead     Return in about 3 months (around 04/30/2018) for for blood pressure .  Future Appointments  Date Time Provider [place]  05/01/2018  8:00 AM Forrest Moron, MD PCP-PCP [facility]    Patient Instructions       IF you received an x-ray today, you will receive an invoice from [facility]. Please contact [facility] at [phone] with questions or concerns regarding your invoice.   IF you received labwork today, you will receive an invoice from [place]. Please contact [facility] at [phone] with questions or concerns regarding your invoice.   Our billing staff will not be able to assist you with questions regarding bills from these companies.  You will be contacted with the lab results as soon as they are available. The fastest way to get your results is to activate your My Chart account. Instructions are located on the last page of this paperwork. If you have not heard from [place] regarding the results in 2 weeks, please contact this office.     Benign Prostatic Hyperplasia Benign prostatic hyperplasia (BPH) is an enlarged prostate gland that is caused by the normal aging process and not by cancer. The prostate is a walnut-sized gland that is involved in the production of semen. It is located in front  of the rectum and below the bladder. The bladder stores urine and the urethra is the tube that carries the urine out of the body. The prostate may get bigger as a man gets older. An enlarged prostate can press on the urethra. This can make it harder to pass urine. The build-up of urine in the bladder can cause infection. Back pressure and infection may progress to bladder damage and kidney (renal) failure. What are the causes? This condition is part of a normal aging process. However, not all men develop problems from this condition. If the prostate enlarges away from the urethra, urine flow will not be blocked. If it enlarges toward the urethra and compresses it, there will be problems passing urine. What increases the risk? This condition is more likely to develop in men over the age of 75 years. What are the signs or symptoms? Symptoms of this condition include:  Getting up often during the night to urinate.  Needing to urinate frequently during the day.  Difficulty starting urine flow.  Decrease in size and strength of your urine stream.  Leaking (dribbling) after urinating.  Inability to pass urine. This needs immediate treatment.  Inability to completely empty your bladder.  Pain when you pass urine. This is more common if there is also an infection.  Urinary tract infection (UTI).  How is this diagnosed? This condition is diagnosed based on your medical history, a physical exam, and your symptoms. Tests will also be done, such as:  A post-void bladder scan. This measures any amount of urine that may remain in your bladder after you finish urinating.  A digital rectal exam. In a rectal exam, your health care provider checks your prostate by putting a lubricated, gloved finger into your rectum to feel the back of your prostate gland. This exam detects the size of your gland and any abnormal lumps or growths.  An exam of your urine (urinalysis).  A prostate specific antigen  (PSA) screening. This is a blood test used to screen for prostate cancer.  An ultrasound. This  test uses sound waves to electronically produce a picture of your prostate gland.  Your health care provider may refer you to a specialist in kidney and prostate diseases (urologist). How is this treated? Once symptoms begin, your health care provider will monitor your condition (active surveillance or watchful waiting). Treatment for this condition will depend on the severity of your condition. Treatment may include:  Observation and yearly exams. This may be the only treatment needed if your condition and symptoms are mild.  Medicines to relieve your symptoms, including: ? Medicines to shrink the prostate. ? Medicines to relax the muscle of the prostate.  Surgery in severe cases. Surgery may include: ? Prostatectomy. In this procedure, the prostate tissue is removed completely through an open incision or with a laparascope or robotics. ? Transurethral resection of the prostate (TURP). In this procedure, a tool is inserted through the opening at the tip of the penis (urethra). It is used to cut away tissue of the inner core of the prostate. The pieces are removed through the same opening of the penis. This removes the blockage. ? Transurethral incision (TUIP). In this procedure, small cuts are made in the prostate. This lessens the prostate's pressure on the urethra. ? Transurethral microwave thermotherapy (TUMT). This procedure uses microwaves to create heat. The heat destroys and removes a small amount of prostate tissue. ? Transurethral needle ablation (TUNA). This procedure uses radio frequencies to destroy and remove a small amount of prostate tissue. ? Interstitial laser coagulation ([place]). This procedure uses a laser to destroy and remove a small amount of prostate tissue. ? Transurethral electrovaporization (TUVP). This procedure uses electrodes to destroy and remove a small amount of prostate  tissue. ? Prostatic urethral lift. This procedure inserts an implant to push the lobes of the prostate away from the urethra.  Follow these instructions at home:  Take over-the-counter and prescription medicines only as told by your health care provider.  Monitor your symptoms for any changes. Contact your health care provider with any changes.  Avoid drinking large amounts of liquid before going to bed or out in public.  Avoid or reduce how much caffeine or alcohol you drink.  Give yourself time when you urinate.  Keep all follow-up visits as told by your health care provider. This is important. Contact a health care provider if:  You have unexplained back pain.  Your symptoms do not get better with treatment.  You develop side effects from the medicine you are taking.  Your urine becomes very dark or has a bad smell.  Your lower abdomen becomes distended and you have trouble passing your urine. Get help right away if:  You have a fever or chills.  You suddenly cannot urinate.  You feel lightheaded, or very dizzy, or you faint.  There are large amounts of blood or clots in the urine.  Your urinary problems become hard to manage.  You develop moderate to severe low back or flank pain. The flank is the side of your body between the ribs and the hip. These symptoms may represent a serious problem that is an emergency. Do not wait to see if the symptoms will go away. Get medical help right away. Call your local emergency services (911 in the [place] Do not drive yourself to the hospital. Summary  Benign prostatic hyperplasia (BPH) is an enlarged prostate that is caused by the normal aging process and not by cancer.  An enlarged prostate can press on the urethra. This can make  it hard to pass urine.  This condition is part of a normal aging process and is more likely to develop in men over the age of 6 years.  Get help right away if you suddenly cannot urinate. This  information is not intended to replace advice given to you by your health care provider. Make sure you discuss any questions you have with your health care provider. Document Released: 12/04/2005 Document Revised: 01/08/2017 Document Reviewed: 01/08/2017 Elsevier Interactive Patient Education  [employer].    An after visit summary with all of these plans was given to the patient.

## 2018-02-01 LAB — COMPREHENSIVE METABOLIC PANEL
ALBUMIN: 4.6 g/dL (ref 3.6–4.8)
ALK PHOS: 52 IU/L (ref 39–117)
ALT: 50 IU/L — AB (ref 0–44)
AST: 24 IU/L (ref 0–40)
Albumin/Globulin Ratio: 1.7 (ref 1.2–2.2)
BILIRUBIN TOTAL: 0.4 mg/dL (ref 0.0–1.2)
BUN / CREAT RATIO: 13 (ref 10–24)
BUN: 14 mg/dL (ref 8–27)
CHLORIDE: 99 mmol/L (ref 96–106)
CO2: 26 mmol/L (ref 20–29)
CREATININE: 1.04 mg/dL (ref 0.76–1.27)
Calcium: 9.5 mg/dL (ref 8.6–10.2)
GFR calc Af Amer: 87 mL/min/{1.73_m2} (ref >59)
GFR calc non Af Amer: 75 mL/min/{1.73_m2} (ref >59)
GLUCOSE: 111 mg/dL — AB (ref 65–99)
Globulin, Total: 2.7 g/dL (ref 1.5–4.5)
Potassium: 3.5 mmol/L (ref 3.5–5.2)
Sodium: 142 mmol/L (ref 134–144)
Total Protein: 7.3 g/dL (ref 6.0–8.5)

## 2018-02-01 LAB — LIPID PANEL
Chol/HDL Ratio: 3.4 ratio (ref 0.0–5.0)
Cholesterol, Total: 169 mg/dL (ref 100–199)
HDL: 49 mg/dL (ref >39)
LDL CALC: 95 mg/dL (ref 0–99)
Triglycerides: 123 mg/dL (ref 0–149)
VLDL CHOLESTEROL CAL: 25 mg/dL (ref 5–40)

## 2018-02-01 LAB — PSA: PROSTATE SPECIFIC AG, SERUM: 1.7 ng/mL (ref 0.0–4.0)

## 2018-02-05 ENCOUNTER — Telehealth: Payer: Self-pay | Admitting: Family Medicine

## 2018-02-05 ENCOUNTER — Encounter: Payer: Self-pay | Admitting: Family Medicine

## 2018-02-05 NOTE — Telephone Encounter (Signed)
Pt given lab results per letter of Dr. Nolon Rod on 02/05/18. Pt verbalized understanding and has no additional questions or concerns at this time.

## 2018-02-22 ENCOUNTER — Telehealth: Payer: Self-pay | Admitting: Family Medicine

## 2018-02-22 NOTE — Telephone Encounter (Signed)
Copied from [place] [id number]. Topic: Quick Communication - See Telephone Encounter >> Feb 22, 2018 10:38 AM Robina Ade, Helene Kelp D wrote: CRM for notification. See Telephone encounter for: 02/22/18. Patient called and said that he forgot to ask Dr. Bridget Hartshorn to see if she can give him something for male sex issues. If there are any question to please call him. His pharmacy is [id number] - [place], [place] - [address] [place].

## 2018-02-22 NOTE — Telephone Encounter (Signed)
Please advise. Dgaddy, CMA 

## 2018-02-22 NOTE — Telephone Encounter (Signed)
Called and left pt VM letting him know that we would need to reschedule this appt to a different day. When he calls back, please reschedule him for an appt with Dr. Nolon Rod for a 3 month F/U for BP.  Thanks!

## 2018-02-25 NOTE — Telephone Encounter (Signed)
This patient needs an appointment to discuss meds for male sex issues

## 2018-03-06 NOTE — Telephone Encounter (Signed)
Pt has been scheduled.  °

## 2018-03-13 DIAGNOSIS — H40013 Open angle with borderline findings, low risk, bilateral: Secondary | ICD-10-CM | POA: Diagnosis not present

## 2018-03-14 ENCOUNTER — Ambulatory Visit (INDEPENDENT_AMBULATORY_CARE_PROVIDER_SITE_OTHER): Payer: Medicare HMO | Admitting: Family Medicine

## 2018-03-14 ENCOUNTER — Other Ambulatory Visit: Payer: Self-pay

## 2018-03-14 ENCOUNTER — Encounter: Payer: Self-pay | Admitting: Family Medicine

## 2018-03-14 VITALS — BP 133/85 | HR 62 | Temp 98.1°F | Resp 17 | Ht 66.0 in | Wt 201.0 lb

## 2018-03-14 DIAGNOSIS — N5201 Erectile dysfunction due to arterial insufficiency: Secondary | ICD-10-CM | POA: Diagnosis not present

## 2018-03-14 DIAGNOSIS — Z1329 Encounter for screening for other suspected endocrine disorder: Secondary | ICD-10-CM

## 2018-03-14 MED ORDER — TADALAFIL 5 MG PO TABS: 5.0000 mg | ORAL_TABLET | Freq: Every day | ORAL | 0 refills | Status: DC | PRN

## 2018-03-14 NOTE — Progress Notes (Signed)
Chief Complaint  Patient presents with  . discuss new med    wants medication to help with sexual activity    HPI   Erectile Dysfunction: Patient complains of erectile dysfunction.  .   Pt reports that sometimes he cannot maintain his erection or sometimes he has trouble with penetration as well for about a year and a half.  He typically has difficulty maintaining erection 33% of the time He has not difficulty with ejaculation if he gets to orgasm He does not wake up with a morning erection No pain with ejaculation or orgasm He denies numbness or tingling in the groin His expectations are to be able to rise to the occasion when his wife is interested.  Risk factors for ED include antihypertensive medications, amlodipine and losartan. Patient denies history of diabetes mellitus, neurologic disease, cranial, spinal, or pelvic trauma, pelvic radiation, urologic disease, beta blocker use and hypogonadism.  Past Medical History:  Diagnosis Date  . Arthritis 02/24/2014  . Class 1 obesity due to excess calories without serious comorbidity with body mass index (BMI) of 32.0 to 32.9 in adult 06/12/2017  . Environmental allergies 02/06/2017  . HLD (hyperlipidemia)   . HTN (hypertension)   . Hypercholesteremia 01/23/2012  . Mixed hyperlipidemia 06/12/2017  . Numbness and tingling in hands 05/01/2014   04/17/2014. [place].  Dorna Leitz, MD.  Radiographs: These do reveal obvious degenerative disc disease at C5-6. There is no instability noted. Plan: We will proceed with getting surgery set up at patient's earliest convenience.   . Prediabetes 06/12/2017    Current Outpatient Medications  Medication Sig Dispense Refill  . amLODipine (NORVASC) 10 MG tablet Take 1 tablet (10 mg total) by mouth daily. 90 tablet 1  . aspirin 81 MG tablet Take 81 mg by mouth daily.    . cyclobenzaprine (FLEXERIL) 10 MG tablet Take 1 tablet (10 mg total) by mouth 3 (three) times  daily as needed for muscle spasms. 30 tablet 0  . fluticasone (FLONASE) 50 MCG/ACT nasal spray Place 2 sprays into both nostrils daily. 48 g 1  . losartan-hydrochlorothiazide (HYZAAR) 100-25 MG tablet Take 1 tablet by mouth daily. 90 tablet 1  . potassium chloride SA (K-DUR,KLOR-CON) 20 MEQ tablet Take 1 tablet (20 mEq total) by mouth 2 (two) times daily. 60 tablet 1  . rosuvastatin (CRESTOR) 20 MG tablet TAKE 1 TABLET BY MOUTH EVERY DAY 90 tablet 1  . tamsulosin (FLOMAX) 0.4 MG CAPS capsule Take 1 capsule (0.4 mg total) by mouth daily. Return for evaluation and labs prior to further refills. 30 capsule 0  . gabapentin (NEURONTIN) 100 MG capsule Take 1 capsule (100 mg total) by mouth 3 (three) times daily. (Patient not taking: Reported on 01/31/2018) 90 capsule 3  . meloxicam (MOBIC) 15 MG tablet Take 1 tablet (15 mg total) by mouth daily. (Patient not taking: Reported on 01/31/2018) 14 tablet 0  . tadalafil (CIALIS) 5 MG tablet Take 1 tablet (5 mg total) by mouth daily as needed for erectile dysfunction. 10 tablet 0   No current facility-administered medications for this visit.     Allergies: No Known Allergies  Past Surgical History:  Procedure Laterality Date  . NECK SURGERY  May 19 2014  . none      Social History   Socioeconomic History  . Marital status: Married    Spouse name: Not on file  . Number of children: 2  . Years of education: Not on file  .  Highest education level: Not on file  Occupational History  . Occupation: MANAGEMENT    Employer: [employer]  Social Needs  . Financial resource strain: Not on file  . Food insecurity:    Worry: Not on file    Inability: Not on file  . Transportation needs:    Medical: Not on file    Non-medical: Not on file  Tobacco Use  . Smoking status: Never Smoker  . Smokeless tobacco: Current User    Types: Snuff  Substance and Sexual Activity  . Alcohol use: Yes    Alcohol/week: 7.2 oz    Types: 6 Cans of beer, 4 Shots  of liquor, 2 Standard drinks or equivalent per week  . Drug use: No  . Sexual activity: Not on file  Lifestyle  . Physical activity:    Days per week: Not on file    Minutes per session: Not on file  . Stress: Not on file  Relationships  . Social connections:    Talks on phone: Not on file    Gets together: Not on file    Attends religious service: Not on file    Active member of club or organization: Not on file    Attends meetings of clubs or organizations: Not on file    Relationship status: Not on file  Other Topics Concern  . Not on file  Social History Narrative  . Not on file    Family History  Problem Relation Age of Onset  . Cancer Unknown   . Hypertension Unknown      ROS Review of Systems See HPI Constitution: No fevers or chills No malaise No diaphoresis Skin: No rash or itching Eyes: no blurry vision, no double vision GU: no dysuria or hematuria Neuro: no dizziness or headaches all others reviewed and negative   Objective: Vitals:   03/14/18 0953  BP: 140/87  Pulse: 77  Resp: 17  Temp: 98.1 F (36.7 C)  TempSrc: Oral  SpO2: 97%  Weight: 201 lb (91.2 kg)  Height: 5\' 6"  (1.676 m)   BP Readings from Last 3 Encounters:  03/14/18 140/87  01/31/18 120/70  06/12/17 130/79    Physical Exam  Constitutional: He is oriented to person, place, and time. He appears well-developed and well-nourished.  HENT:  Head: Normocephalic and atraumatic.  Eyes: Conjunctivae and EOM are normal.  Cardiovascular: Normal rate, regular rhythm and normal heart sounds.  No murmur heard. Pulmonary/Chest: Effort normal and breath sounds normal. No stridor. No respiratory distress.  Neurological: He is oriented to person, place, and time.    Assessment and Plan Jeovany was seen today for discuss new med.  Diagnoses and all orders for this visit:  Erectile dysfunction due to arterial insufficiency -     TSH -     TestT+TestF+SHBG  Other orders -     tadalafil  (CIALIS) 5 MG tablet; Take 1 tablet (5 mg total) by mouth daily as needed for erectile dysfunction.   Discussed that since he does not have depression, liver disease or takes nitrates he should consider Cialis Discussed other risk factors Discussed viagra and levitra Discussed evaluation for thyroid disease and hypoandrogenism Will try cialis. Pt to start with 1/2 tablet and increase dose to max of 20mg  Discussed that if he has an erection for more than 4 hours to seek medical attention Discussed that if he has chest pains during intercourse and seeks medical attention to not to take NITRATES He verbalized understanding  A total  of 25 minutes were spent face-to-face with the patient during this encounter and over half of that time was spent on counseling and coordination of care.   [place]

## 2018-03-14 NOTE — Patient Instructions (Addendum)
IF you received an x-ray today, you will receive an invoice from [facility]. Please contact [facility] at [phone] with questions or concerns regarding your invoice.   IF you received labwork today, you will receive an invoice from [facility]. Please contact [facility] at [phone] with questions or concerns regarding your invoice.   Our billing staff will not be able to assist you with questions regarding bills from these companies.  You will be contacted with the lab results as soon as they are available. The fastest way to get your results is to activate your My Chart account. Instructions are located on the last page of this paperwork. If you have not heard from [place] regarding the results in 2 weeks, please contact this office.     Erectile Dysfunction Erectile dysfunction (ED) is the inability to get or keep an erection in order to have sexual intercourse. Erectile dysfunction may include:  Inability to get an erection.  Lack of enough hardness of the erection to allow penetration.  Loss of the erection before sex is finished.  What are the causes? This condition may be caused by:  Certain medicines, such as: ? Pain relievers. ? Antihistamines. ? Antidepressants. ? Blood pressure medicines. ? Water pills (diuretics). ? Ulcer medicines. ? Muscle relaxants. ? Drugs.  Excessive drinking.  Psychological causes, such as: ? Anxiety. ? Depression. ? Sadness. ? Exhaustion. ? Performance fear. ? Stress.  Physical causes, such as: ? Artery problems. This may include diabetes, smoking, liver disease, or atherosclerosis. ? High blood pressure. ? Hormonal problems, such as low testosterone. ? Obesity. ? Nerve problems. This may include back or pelvic injuries, diabetes mellitus, multiple sclerosis, or Parkinson disease.  What are the signs or symptoms? Symptoms of this condition include:  Inability to get an erection.  Lack of enough hardness  of the erection to allow penetration.  Loss of the erection before sex is finished.  Normal erections at some times, but with frequent unsatisfactory episodes.  Low sexual satisfaction in either partner due to erection problems.  A curved penis occurring with erection. The curve may cause pain or the penis may be too curved to allow for intercourse.  Never having nighttime erections.  How is this diagnosed? This condition is often diagnosed by:  Performing a physical exam to find other diseases or specific problems with the penis.  Asking you detailed questions about the problem.  Performing blood tests to check for diabetes mellitus or to measure hormone levels.  Performing other tests to check for underlying health conditions.  Performing an ultrasound exam to check for scarring.  Performing a test to check blood flow to the penis.  Doing a sleep study at home to measure nighttime erections.  How is this treated? This condition may be treated by:  Medicine taken by mouth to help you achieve an erection (oral medicine).  Hormone replacement therapy to replace low testosterone levels.  Medicine that is injected into the penis. Your health care provider may instruct you how to give yourself these injections at home.  Vacuum pump. This is a pump with a ring on it. The pump and ring are placed on the penis and used to create pressure that helps the penis become erect.  Penile implant surgery. In this procedure, you may receive: ? An inflatable implant. This consists of cylinders, a pump, and a reservoir. The cylinders can be inflated with a fluid that helps to create an erection, and they can be deflated  after intercourse. ? A semi-rigid implant. This consists of two silicone rubber rods. The rods provide some rigidity. They are also flexible, so the penis can both curve downward in its normal position and become straight for sexual intercourse.  Blood vessel surgery, to  improve blood flow to the penis. During this procedure, a blood vessel from a different part of the body is placed into the penis to allow blood to flow around (bypass) damaged or blocked blood vessels.  Lifestyle changes, such as exercising more, losing weight, and quitting smoking.  Follow these instructions at home: Medicines  Take over-the-counter and prescription medicines only as told by your health care provider. Do not increase the dosage without first discussing it with your health care provider.  If you are using self-injections, perform injections as directed by your health care provider. Make sure to avoid any veins that are on the surface of the penis. After giving an injection, apply pressure to the injection site for 5 minutes. General instructions  Exercise regularly, as directed by your health care provider. Work with your health care provider to lose weight, if needed.  Do not use any products that contain nicotine or tobacco, such as cigarettes and e-cigarettes. If you need help quitting, ask your health care provider.  Before using a vacuum pump, read the instructions that come with the pump and discuss any questions with your health care provider.  Keep all follow-up visits as told by your health care provider. This is important. Contact a health care provider if:  You feel nauseous.  You vomit. Get help right away if:  You are taking oral or injectable medicines and you have an erection that lasts longer than 4 hours. If your health care provider is unavailable, go to the nearest emergency room for evaluation. An erection that lasts much longer than 4 hours can result in permanent damage to your penis.  You have severe pain in your groin or abdomen.  You develop redness or severe swelling of your penis.  You have redness spreading up into your groin or lower abdomen.  You are unable to urinate.  You experience chest pain or a rapid heart beat (palpitations)  after taking oral medicines. Summary  Erectile dysfunction (ED) is the inability to get or keep an erection during sexual intercourse. This problem can usually be treated successfully.  This condition is diagnosed based on a physical exam, your symptoms, and tests to determine the cause. Treatment varies depending on the cause, and may include medicines, hormone therapy, surgery, or vacuum pump.  You may need follow-up visits to make sure that you are using your medicines or devices correctly.  Get help right away if you are taking or injecting medicines and you have an erection that lasts longer than 4 hours. This information is not intended to replace advice given to you by your health care provider. Make sure you discuss any questions you have with your health care provider. Document Released: 12/01/2000 Document Revised: 12/20/2016 Document Reviewed: 12/20/2016 Elsevier Interactive Patient Education  2017 [employer].

## 2018-03-17 LAB — TSH: TSH: 1.05 u[IU]/mL (ref 0.450–4.500)

## 2018-03-17 LAB — TESTT+TESTF+SHBG
SEX HORMONE BINDING: 25.7 nmol/L (ref 19.3–76.4)
TESTOSTERONE, TOTAL: 207.9 ng/dL — AB (ref 264.0–916.0)
Testosterone, Free: 8.9 pg/mL (ref 6.6–18.1)

## 2018-03-30 ENCOUNTER — Other Ambulatory Visit: Payer: Self-pay | Admitting: Family Medicine

## 2018-04-01 NOTE — Telephone Encounter (Signed)
Refill for:  klor-con m20 20 meq tablet  Provider: Delia Chimes, MD  LOV:  03/14/18 NOV:  05/06/18  Pharmacy:  [id number] [address]  Please review.

## 2018-04-24 ENCOUNTER — Encounter: Payer: Self-pay | Admitting: Internal Medicine

## 2018-05-01 ENCOUNTER — Ambulatory Visit: Payer: Medicare HMO | Admitting: Family Medicine

## 2018-05-05 NOTE — Progress Notes (Signed)
Chief Complaint  Patient presents with  . Hypertension    3 month f/u, left knee fluid and feels like knee is gonna fall off and collapse    HPI   Hypertension  Patient reports that his blood pressure is well controlled at home. He is taking his Hyzaar and amlodipine without side effects.  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 He sticks to a [employer]. He denies chest pain, palpitations, shortness of breath, lower extremity edema, headaches, vision changes or PND BP Readings from Last 3 Encounters:  05/06/18 128/70  03/14/18 133/85  01/31/18 120/70   Osteoarthritis- knee pain Patient reports that his left knee has swelling in the morning, with stiffness and pain Once he walks on it the knee pain goes away He states that this seemed to be only on the left knee Onset was 3-4 weeks ago He denies previous injury to the knee He takes tylenol or advil for the knee pain  Shoulder pain- new He also has some shoulder pain that has started bothering him that is worse in the left shoulder than the right.  There is not change in grip strength and he is still able to mow lawns and do yard work. He denies any falls onto the shoulder, no clicking or crunching.   Colon Cancer Screening Last colonoscopy 04/22/08 by [facility] showed internal hemorrhoids He denies blood in his stool, unexpected weight loss or pain with defecation No rectal itching He does not smoke He does not have a family history of colon cancer    Past Medical History:  Diagnosis Date  . Arthritis 02/24/2014  . Class 1 obesity due to excess calories without serious comorbidity with body mass index (BMI) of 32.0 to 32.9 in adult 06/12/2017  . Environmental allergies 02/06/2017  . HLD (hyperlipidemia)   . HTN (hypertension)   . Hypercholesteremia 01/23/2012  . Mixed hyperlipidemia 06/12/2017  . Numbness and tingling in hands 05/01/2014   04/17/2014. [place].  Dorna Leitz, MD.   Radiographs: These do reveal obvious degenerative disc disease at C5-6. There is no instability noted. Plan: We will proceed with getting surgery set up at patient's earliest convenience.   . Prediabetes 06/12/2017    Current Outpatient Medications  Medication Sig Dispense Refill  . amLODipine (NORVASC) 10 MG tablet Take 1 tablet (10 mg total) by mouth daily. 90 tablet 1  . aspirin 81 MG tablet Take 81 mg by mouth daily.    . cyclobenzaprine (FLEXERIL) 10 MG tablet Take 1 tablet (10 mg total) by mouth 3 (three) times daily as needed for muscle spasms. 30 tablet 1  . losartan-hydrochlorothiazide (HYZAAR) 100-25 MG tablet Take 1 tablet by mouth daily. 90 tablet 1  . meloxicam (MOBIC) 15 MG tablet Take 1 tablet (15 mg total) by mouth daily. With food 30 tablet 3  . rosuvastatin (CRESTOR) 20 MG tablet TAKE 1 TABLET BY MOUTH EVERY DAY 90 tablet 1  . gabapentin (NEURONTIN) 100 MG capsule Take 1 capsule (100 mg total) by mouth 3 (three) times daily. (Patient not taking: Reported on 01/31/2018) 90 capsule 3  . potassium chloride SA (KLOR-CON M20) 20 MEQ tablet Take 1 tablet (20 mEq total) by mouth 2 (two) times daily. 60 tablet 0   No current facility-administered medications for this visit.     Allergies: No Known Allergies  Past Surgical History:  Procedure Laterality Date  . NECK SURGERY  May 19 2014  . none  Social History   Socioeconomic History  . Marital status: Married    Spouse name: Not on file  . Number of children: 2  . Years of education: Not on file  . Highest education level: Not on file  Occupational History  . Occupation: MANAGEMENT    Employer: [employer]  Social Needs  . Financial resource strain: Not on file  . Food insecurity:    Worry: Not on file    Inability: Not on file  . Transportation needs:    Medical: Not on file    Non-medical: Not on file  Tobacco Use  . Smoking status: Never Smoker  . Smokeless tobacco: Current User    Types: Snuff    Substance and Sexual Activity  . Alcohol use: Yes    Alcohol/week: 7.2 oz    Types: 6 Cans of beer, 4 Shots of liquor, 2 Standard drinks or equivalent per week  . Drug use: No  . Sexual activity: Not on file  Lifestyle  . Physical activity:    Days per week: Not on file    Minutes per session: Not on file  . Stress: Not on file  Relationships  . Social connections:    Talks on phone: Not on file    Gets together: Not on file    Attends religious service: Not on file    Active member of club or organization: Not on file    Attends meetings of clubs or organizations: Not on file    Relationship status: Not on file  Other Topics Concern  . Not on file  Social History Narrative  . Not on file    Family History  Problem Relation Age of Onset  . Cancer Unknown   . Hypertension Unknown      ROS Review of Systems See HPI Constitution: No fevers or chills No malaise No diaphoresis Skin: No rash or itching Eyes: no blurry vision, no double vision GU: no dysuria or hematuria Neuro: no dizziness or headaches all others reviewed and negative   Objective: Vitals:   05/06/18 0922  BP: 128/70  Pulse: 88  Resp: 17  Temp: 98.8 F (37.1 C)  TempSrc: Oral  SpO2: 97%  Weight: 193 lb 12.8 oz (87.9 kg)  Height: 5\' 6"  (1.676 m)    Physical Exam  Constitutional: He is oriented to person, place, and time. He appears well-developed and well-nourished.  HENT:  Head: Normocephalic and atraumatic.  Right Ear: External ear normal.  Left Ear: External ear normal.  Eyes: Conjunctivae and EOM are normal.  Neck: Normal range of motion. Neck supple.  Cardiovascular: Normal rate, regular rhythm and normal heart sounds.  No murmur heard. Pulmonary/Chest: Effort normal and breath sounds normal. No stridor. No respiratory distress.  Musculoskeletal:       Right shoulder: He exhibits normal range of motion, no tenderness, no bony tenderness, no swelling, no effusion, no crepitus, no  deformity, no laceration, no pain, no spasm, normal pulse and normal strength.       Left shoulder: He exhibits tenderness, bony tenderness and pain. He exhibits normal range of motion, no swelling, no effusion, no crepitus, no deformity, no laceration, no spasm, normal pulse and normal strength.       Right knee: He exhibits normal range of motion, no swelling, no effusion, no ecchymosis, no deformity, no laceration and no erythema. No tenderness found. No medial joint line and no lateral joint line tenderness noted.       Left knee:  He exhibits swelling and bony tenderness. He exhibits normal range of motion, no effusion, no ecchymosis, no deformity, no laceration, no erythema, normal alignment, no LCL laxity, normal patellar mobility, normal meniscus and no MCL laxity. Tenderness found. Medial joint line tenderness noted. No lateral joint line, no MCL, no LCL and no patellar tendon tenderness noted.  Left ac joint tenderness  Neurological: He is alert and oriented to person, place, and time.  Skin: Skin is warm. No rash noted.  Psychiatric: He has a normal mood and affect. His behavior is normal. Judgment and thought content normal.     LEFT SHOULDER - 2+ VIEW  COMPARISON:  None.  FINDINGS: There is no fracture or dislocation. The glenohumeral joint is normal. There are mild degenerative changes of the acromioclavicular joint.  IMPRESSION: Mild arthropathy of the left acromioclavicular joint.   Electronically Signed   By: Kathreen Devoid   On: 05/06/2018 10:12  LEFT KNEE - COMPLETE 4+ VIEW  COMPARISON:  None.  FINDINGS: Bones: No fracture or dislocation. Normal bone mineralization. No periostitis.  Joints: Normal alignment. No erosive changes. Mild medial femorotibial compartment joint space narrowing. Remainder the joint spaces are maintained. Moderate left knee joint effusion.  Soft tissue: No soft tissue abnormality. No radiopaque foreign body. No  chondrocalcinosis.  IMPRESSION: 1. Mild osteoarthritis of the medial femorotibial compartment of the left knee. Moderate left knee joint effusion.   Electronically Signed   By: Kathreen Devoid   On: 05/06/2018 10:09    Assessment and Plan [place] was seen today for hypertension.  Diagnoses and all orders for this visit:  Pain and swelling of left knee- deterioration  xray shows chronic arthritis, would recommend once daily meloxicam with food -     DG Knee Complete 4 Views Left; Future  Special screening for malignant neoplasms, colon- discussed routine screening, referred pt for repeat cscopy -     Ambulatory referral to Gastroenterology  Essential hypertension Patient's blood pressure is at goal of 139/89 or less. Condition is stable. Continue current medications and treatment plan. I also recommend a balanced diet with fruits and vegetables every day, lean meats, and little fried foods. The DASH diet (you can find this online) is a good example of this.  Arthralgia of left acromioclavicular joint- new concern Showing arthritis Advised that the meloxicam will also help If no improvement could get a steroid intraarticular injection -     DG Shoulder Left; Future  Other orders -     potassium chloride SA (KLOR-CON M20) 20 MEQ tablet; Take 1 tablet (20 mEq total) by mouth 2 (two) times daily. -     cyclobenzaprine (FLEXERIL) 10 MG tablet; Take 1 tablet (10 mg total) by mouth 3 (three) times daily as needed for muscle spasms. -     meloxicam (MOBIC) 15 MG tablet; Take 1 tablet (15 mg total) by mouth daily. With food     [place]

## 2018-05-06 ENCOUNTER — Encounter: Payer: Self-pay | Admitting: Family Medicine

## 2018-05-06 ENCOUNTER — Ambulatory Visit (INDEPENDENT_AMBULATORY_CARE_PROVIDER_SITE_OTHER): Payer: Medicare HMO

## 2018-05-06 ENCOUNTER — Other Ambulatory Visit: Payer: Self-pay

## 2018-05-06 ENCOUNTER — Ambulatory Visit (INDEPENDENT_AMBULATORY_CARE_PROVIDER_SITE_OTHER): Payer: Medicare HMO | Admitting: Family Medicine

## 2018-05-06 VITALS — BP 128/70 | HR 88 | Temp 98.8°F | Resp 17 | Ht 66.0 in | Wt 193.8 lb

## 2018-05-06 DIAGNOSIS — M25562 Pain in left knee: Secondary | ICD-10-CM

## 2018-05-06 DIAGNOSIS — Z1211 Encounter for screening for malignant neoplasm of colon: Secondary | ICD-10-CM | POA: Diagnosis not present

## 2018-05-06 DIAGNOSIS — I10 Essential (primary) hypertension: Secondary | ICD-10-CM | POA: Diagnosis not present

## 2018-05-06 DIAGNOSIS — M25512 Pain in left shoulder: Secondary | ICD-10-CM

## 2018-05-06 DIAGNOSIS — M25462 Effusion, left knee: Secondary | ICD-10-CM

## 2018-05-06 DIAGNOSIS — M1712 Unilateral primary osteoarthritis, left knee: Secondary | ICD-10-CM | POA: Diagnosis not present

## 2018-05-06 DIAGNOSIS — M19012 Primary osteoarthritis, left shoulder: Secondary | ICD-10-CM | POA: Diagnosis not present

## 2018-05-06 MED ORDER — MELOXICAM 15 MG PO TABS: 15.0000 mg | ORAL_TABLET | Freq: Every day | ORAL | 3 refills | Status: DC

## 2018-05-06 MED ORDER — POTASSIUM CHLORIDE CRYS ER 20 MEQ PO TBCR: 20.0000 meq | EXTENDED_RELEASE_TABLET | Freq: Two times a day (BID) | ORAL | 0 refills | Status: DC

## 2018-05-06 MED ORDER — CYCLOBENZAPRINE HCL 10 MG PO TABS: 10.0000 mg | ORAL_TABLET | Freq: Three times a day (TID) | ORAL | 1 refills | Status: DC | PRN

## 2018-05-06 NOTE — Patient Instructions (Addendum)
COMPARISON:  None.  FINDINGS: There is no fracture or dislocation. The glenohumeral joint is normal. There are mild degenerative changes of the acromioclavicular joint.  IMPRESSION: Mild arthropathy of the left acromioclavicular joint.   Electronically Signed   By: Kathreen Devoid   On: 05/06/2018 10:12   IMPRESSION: 1. Mild osteoarthritis of the medial femorotibial compartment of the left knee. Moderate left knee joint effusion.   Electronically Signed   By: Kathreen Devoid   On: 05/06/2018 10:09  IF you received an x-ray today, you will receive an invoice from [facility]. Please contact [facility] at [phone] with questions or concerns regarding your invoice.   IF you received labwork today, you will receive an invoice from [place]. Please contact [facility] at [phone] with questions or concerns regarding your invoice.   Our billing staff will not be able to assist you with questions regarding bills from these companies.  You will be contacted with the lab results as soon as they are available. The fastest way to get your results is to activate your My Chart account. Instructions are located on the last page of this paperwork. If you have not heard from [place] regarding the results in 2 weeks, please contact this office.    Joint Pain Joint pain, which is also called arthralgia, can be caused by many things. Joint pain often goes away when you follow your health care provider's instructions for relieving pain at home. However, joint pain can also be caused by conditions that require further treatment. Common causes of joint pain include:  Bruising in the area of the joint.  Overuse of the joint.  Wear and tear on the joints that occur with aging (osteoarthritis).  Various other forms of arthritis.  A buildup of a crystal form of uric acid in the joint (gout).  Infections of the joint (septic arthritis) or of the bone (osteomyelitis).  Your  health care provider may recommend medicine to help with the pain. If your joint pain continues, additional tests may be needed to diagnose your condition. Follow these instructions at home: Watch your condition for any changes. Follow these instructions as directed to lessen the pain that you are feeling.  Take medicines only as directed by your health care provider.  Rest the affected area for as long as your health care provider says that you should. If directed to do so, raise the painful joint above the level of your heart while you are sitting or lying down.  Do not do things that cause or worsen pain.  If directed, apply ice to the painful area: ? Put ice in a plastic bag. ? Place a towel between your skin and the bag. ? Leave the ice on for 20 minutes, 2-3 times per day.  Wear an elastic bandage, splint, or sling as directed by your health care provider. Loosen the elastic bandage or splint if your fingers or toes become numb and tingle, or if they turn cold and blue.  Begin exercising or stretching the affected area as directed by your health care provider. Ask your health care provider what types of exercise are safe for you.  Keep all follow-up visits as directed by your health care provider. This is important.  Contact a health care provider if:  Your pain increases, and medicine does not help.  Your joint pain does not improve within 3 days.  You have increased bruising or swelling.  You have a fever.  You lose 10 lb (4.5 kg)  or more without trying. Get help right away if:  You are not able to move the joint.  Your fingers or toes become numb or they turn cold and blue. This information is not intended to replace advice given to you by your health care provider. Make sure you discuss any questions you have with your health care provider. Document Released: 12/04/2005 Document Revised: 05/05/2016 Document Reviewed: 09/15/2014 Elsevier Interactive Patient Education   [employer].

## 2018-05-14 ENCOUNTER — Encounter: Payer: Self-pay | Admitting: Family Medicine

## 2018-05-17 ENCOUNTER — Encounter: Payer: Self-pay | Admitting: Internal Medicine

## 2018-05-17 NOTE — Telephone Encounter (Signed)
Error

## 2018-05-20 ENCOUNTER — Telehealth: Payer: Self-pay | Admitting: Family Medicine

## 2018-05-20 NOTE — Telephone Encounter (Signed)
Prior authorization for cyclobenzaprine 10mg  tablet denied- no diagnosis code present.   Prior authorization completed with diagnosis code included on CoverMyMeds: Zachari Rihn (Key: [id number]) - [id number]

## 2018-05-22 NOTE — Telephone Encounter (Signed)
MEDICATION APPROVED THRU 12/17/2018

## 2018-06-05 DIAGNOSIS — R69 Illness, unspecified: Secondary | ICD-10-CM | POA: Diagnosis not present

## 2018-06-05 DIAGNOSIS — E785 Hyperlipidemia, unspecified: Secondary | ICD-10-CM | POA: Diagnosis not present

## 2018-06-05 DIAGNOSIS — G8929 Other chronic pain: Secondary | ICD-10-CM | POA: Diagnosis not present

## 2018-06-05 DIAGNOSIS — Z8249 Family history of ischemic heart disease and other diseases of the circulatory system: Secondary | ICD-10-CM | POA: Diagnosis not present

## 2018-06-05 DIAGNOSIS — I10 Essential (primary) hypertension: Secondary | ICD-10-CM | POA: Diagnosis not present

## 2018-06-05 DIAGNOSIS — Z803 Family history of malignant neoplasm of breast: Secondary | ICD-10-CM | POA: Diagnosis not present

## 2018-06-05 DIAGNOSIS — Z7982 Long term (current) use of aspirin: Secondary | ICD-10-CM | POA: Diagnosis not present

## 2018-06-05 DIAGNOSIS — E669 Obesity, unspecified: Secondary | ICD-10-CM | POA: Diagnosis not present

## 2018-06-05 DIAGNOSIS — Z6833 Body mass index (BMI) 33.0-33.9, adult: Secondary | ICD-10-CM | POA: Diagnosis not present

## 2018-06-05 DIAGNOSIS — Z791 Long term (current) use of non-steroidal anti-inflammatories (NSAID): Secondary | ICD-10-CM | POA: Diagnosis not present
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
## 2018-07-22 ENCOUNTER — Other Ambulatory Visit: Payer: Self-pay

## 2018-07-22 ENCOUNTER — Ambulatory Visit (AMBULATORY_SURGERY_CENTER): Payer: Self-pay

## 2018-07-22 VITALS — Ht 65.0 in | Wt 201.8 lb

## 2018-07-22 DIAGNOSIS — Z1211 Encounter for screening for malignant neoplasm of colon: Secondary | ICD-10-CM

## 2018-07-22 MED ORDER — NA SULFATE-K SULFATE-MG SULF 17.5-3.13-1.6 GM/177ML PO SOLN: 1.0000 | Freq: Once | ORAL | 0 refills | Status: AC

## 2018-07-22 NOTE — Progress Notes (Signed)
No egg or soy allergy known to patient  No issues with past sedation with any surgeries  or procedures, no intubation problems  No diet pills per patient No home 02 use per patient  No blood thinners per patient  Pt denies issues with constipation  No A fib or A flutter  EMMI video sent to pt's e mail , pt declined    

## 2018-07-24 ENCOUNTER — Encounter: Payer: Self-pay | Admitting: Internal Medicine

## 2018-07-31 ENCOUNTER — Telehealth: Payer: Self-pay | Admitting: Internal Medicine

## 2018-07-31 ENCOUNTER — Other Ambulatory Visit: Payer: Self-pay | Admitting: Family Medicine

## 2018-07-31 NOTE — Telephone Encounter (Signed)
Patient states prep is $99. Pt will come up to 3rd floor to get at sample for Suprep 1 kit-lot [id number], exp. 6/21.

## 2018-07-31 NOTE — Telephone Encounter (Signed)
meloxicam 15 mgrefill Last Refill:05/06/18 # 30 and 3 refills Last OV: 05/06/18 PCP: [place]:  [facility] [address]  No BMP noted Please review

## 2018-07-31 NOTE — Telephone Encounter (Signed)
Patient states prep is too expensive and wants to know if there is something else cheaper he can have [facility] the [facility]. Pt had pv 8.5.19

## 2018-08-05 ENCOUNTER — Encounter: Payer: Self-pay | Admitting: Internal Medicine

## 2018-08-05 ENCOUNTER — Ambulatory Visit (AMBULATORY_SURGERY_CENTER): Payer: Medicare HMO | Admitting: Internal Medicine

## 2018-08-05 VITALS — BP 102/54 | HR 53 | Temp 98.2°F | Resp 18 | Ht 66.0 in | Wt 193.0 lb

## 2018-08-05 DIAGNOSIS — Z1211 Encounter for screening for malignant neoplasm of colon: Secondary | ICD-10-CM

## 2018-08-05 MED ORDER — SODIUM CHLORIDE 0.9 % IV SOLN: 500.0000 mL | Freq: Once | INTRAVENOUS | Status: DC

## 2018-08-05 NOTE — Op Note (Signed)
[place] Patient Name: Bryce Rodriguez Procedure Date: 08/05/2018 8:40 AM MRN: [id number] Endoscopist: Docia Chuck. Henrene Pastor , MD Age: 67 Referring MD:  Date of Birth: 06-25-1951 Gender: Male Account #: [id number] Procedure:                Colonoscopy Indications:              Screening for colorectal malignant neoplasm. Prior                            examinations 2002 and 2009 negative for neoplasia Medicines:                Monitored Anesthesia Care Procedure:                Pre-Anesthesia Assessment:                           - Prior to the procedure, a History and Physical                            was performed, and patient medications and                            allergies were reviewed. The patient's tolerance of                            previous anesthesia was also reviewed. The risks                            and benefits of the procedure and the sedation                            options and risks were discussed with the patient.                            All questions were answered, and informed consent                            was obtained. Prior Anticoagulants: The patient has                            taken no previous anticoagulant or antiplatelet                            agents. ASA Grade Assessment: II - A patient with                            mild systemic disease. After reviewing the risks                            and benefits, the patient was deemed in                            satisfactory condition to undergo the procedure.  After obtaining informed consent, the colonoscope                            was passed under direct vision. Throughout the                            procedure, the patient's blood pressure, pulse, and                            oxygen saturations were monitored continuously. The                            Colonoscope was introduced through the anus and                            advanced to the  the cecum, identified by                            appendiceal orifice and ileocecal valve. The                            ileocecal valve, appendiceal orifice, and rectum                            were photographed. The quality of the bowel                            preparation was excellent. The colonoscopy was                            performed without difficulty. The patient tolerated                            the procedure well. The bowel preparation used was                            SUPREP. Scope In: 8:50:02 AM Scope Out: 9:03:33 AM Scope Withdrawal Time: 0 hours 10 minutes 13 seconds  Total Procedure Duration: 0 hours 13 minutes 31 seconds  Findings:                 Internal hemorrhoids were found during retroflexion.                           The exam was otherwise without abnormality on                            direct and retroflexion views. Complications:            No immediate complications. Estimated blood loss:                            None. Estimated Blood Loss:     Estimated blood loss: none. Impression:               - Internal hemorrhoids.                           -  The examination was otherwise normal on direct                            and retroflexion views.                           - No specimens collected. Recommendation:           - Repeat colonoscopy in 10 years for screening                            purposes.                           - Patient has a contact number available for                            emergencies. The signs and symptoms of potential                            delayed complications were discussed with the                            patient. Return to normal activities tomorrow.                            Written discharge instructions were provided to the                            patient.                           - Resume previous diet.                           - Continue present medications. Docia Chuck. Henrene Pastor,  MD 08/05/2018 9:07:24 AM This report has been signed electronically.

## 2018-08-05 NOTE — Progress Notes (Signed)
Pt. Reports no change in his medical or surgical history since his pre-visit 07/22/2018.

## 2018-08-05 NOTE — Patient Instructions (Signed)
No Handouts Needed  YOU HAD AN ENDOSCOPIC PROCEDURE TODAY AT [facility]:   Refer to the procedure report that was given to you for any specific questions about what was found during the examination.  If the procedure report does not answer your questions, please call your gastroenterologist to clarify.  If you requested that your care partner not be given the details of your procedure findings, then the procedure report has been included in a sealed envelope for you to review at your convenience later.  YOU SHOULD EXPECT: Some feelings of bloating in the abdomen. Passage of more gas than usual.  Walking can help get rid of the air that was put into your GI tract during the procedure and reduce the bloating. If you had a lower endoscopy (such as a colonoscopy or flexible sigmoidoscopy) you may notice spotting of blood in your stool or on the toilet paper. If you underwent a bowel prep for your procedure, you may not have a normal bowel movement for a few days.  Please Note:  You might notice some irritation and congestion in your nose or some drainage.  This is from the oxygen used during your procedure.  There is no need for concern and it should clear up in a day or so.  SYMPTOMS TO REPORT IMMEDIATELY:   Following lower endoscopy (colonoscopy or flexible sigmoidoscopy):  Excessive amounts of blood in the stool  Significant tenderness or worsening of abdominal pains  Swelling of the abdomen that is new, acute  Fever of 100F or higher  For urgent or emergent issues, a gastroenterologist can be reached at any hour by calling [phone].   DIET:  We do recommend a small meal at first, but then you may proceed to your regular diet.  Drink plenty of fluids but you should avoid alcoholic beverages for 24 hours.  ACTIVITY:  You should plan to take it easy for the rest of today and you should NOT DRIVE or use heavy machinery until tomorrow (because of the sedation medicines used  during the test).    FOLLOW UP: Our staff will call the number listed on your records the next business day following your procedure to check on you and address any questions or concerns that you may have regarding the information given to you following your procedure. If we do not reach you, we will leave a message.  However, if you are feeling well and you are not experiencing any problems, there is no need to return our call.  We will assume that you have returned to your regular daily activities without incident.  If any biopsies were taken you will be contacted by phone or by letter within the next 1-3 weeks.  Please call us at [phone] if you have not heard about the biopsies in 3 weeks.    SIGNATURES/CONFIDENTIALITY: You and/or your care partner have signed paperwork which will be entered into your electronic medical record.  These signatures attest to the fact that that the information above on your After Visit Summary has been reviewed and is understood.  Full responsibility of the confidentiality of this discharge information lies with you and/or your care-partner.

## 2018-08-05 NOTE — Progress Notes (Signed)
A and O x3. Report to RN. Tolerated MAC anesthesia well.

## 2018-08-06 ENCOUNTER — Telehealth: Payer: Self-pay | Admitting: *Deleted

## 2018-08-06 NOTE — Telephone Encounter (Signed)
  Follow up Call-  Call back number 08/05/2018  Post procedure Call Back phone  # [phone]  Permission to leave phone message Yes  Some recent data might be hidden     Patient questions:  Do you have a fever, pain , or abdominal swelling? No. Pain Score  0 *  Have you tolerated food without any problems? Yes.    Have you been able to return to your normal activities? Yes.    Do you have any questions about your discharge instructions: Diet   No. Medications  No. Follow up visit  No.  Do you have questions or concerns about your Care? No.  Actions: * If pain score is 4 or above: No action needed, pain <4.

## 2018-08-09 ENCOUNTER — Other Ambulatory Visit: Payer: Self-pay | Admitting: Family Medicine

## 2018-08-12 ENCOUNTER — Other Ambulatory Visit: Payer: Self-pay

## 2018-08-12 ENCOUNTER — Ambulatory Visit (INDEPENDENT_AMBULATORY_CARE_PROVIDER_SITE_OTHER): Payer: Medicare HMO | Admitting: Family Medicine

## 2018-08-12 ENCOUNTER — Encounter: Payer: Self-pay | Admitting: Family Medicine

## 2018-08-12 VITALS — BP 128/70 | HR 69 | Temp 98.5°F | Resp 17 | Ht 66.0 in | Wt 199.2 lb

## 2018-08-12 DIAGNOSIS — E785 Hyperlipidemia, unspecified: Secondary | ICD-10-CM | POA: Diagnosis not present

## 2018-08-12 DIAGNOSIS — I10 Essential (primary) hypertension: Secondary | ICD-10-CM | POA: Diagnosis not present

## 2018-08-12 DIAGNOSIS — N5203 Combined arterial insufficiency and corporo-venous occlusive erectile dysfunction: Secondary | ICD-10-CM | POA: Diagnosis not present

## 2018-08-12 DIAGNOSIS — R202 Paresthesia of skin: Secondary | ICD-10-CM

## 2018-08-12 DIAGNOSIS — Z5181 Encounter for therapeutic drug level monitoring: Secondary | ICD-10-CM | POA: Diagnosis not present

## 2018-08-12 DIAGNOSIS — R200 Anesthesia of skin: Secondary | ICD-10-CM | POA: Diagnosis not present

## 2018-08-12 DIAGNOSIS — M1990 Unspecified osteoarthritis, unspecified site: Secondary | ICD-10-CM

## 2018-08-12 DIAGNOSIS — R7989 Other specified abnormal findings of blood chemistry: Secondary | ICD-10-CM | POA: Diagnosis not present

## 2018-08-12 MED ORDER — LOSARTAN POTASSIUM-HCTZ 100-25 MG PO TABS: 1.0000 | ORAL_TABLET | Freq: Every day | ORAL | 1 refills | Status: DC

## 2018-08-12 MED ORDER — ROSUVASTATIN CALCIUM 20 MG PO TABS: ORAL_TABLET | ORAL | 1 refills | Status: DC

## 2018-08-12 MED ORDER — AMLODIPINE BESYLATE 10 MG PO TABS: 10.0000 mg | ORAL_TABLET | Freq: Every day | ORAL | 1 refills | Status: DC

## 2018-08-12 MED ORDER — MELOXICAM 15 MG PO TABS: 15.0000 mg | ORAL_TABLET | Freq: Every day | ORAL | 3 refills | Status: DC

## 2018-08-12 MED ORDER — TADALAFIL 5 MG PO TABS: 5.0000 mg | ORAL_TABLET | Freq: Every day | ORAL | 11 refills | Status: DC | PRN

## 2018-08-12 MED ORDER — GABAPENTIN 300 MG PO CAPS: 300.0000 mg | ORAL_CAPSULE | Freq: Every day | ORAL | 1 refills | Status: DC

## 2018-08-12 NOTE — Progress Notes (Signed)
Chief Complaint  Patient presents with  . Hypertension    follow up and pt wants refill on ED medication    HPI  Hypertension: Patient here for follow-up of elevated blood pressure. He is exercising and is adherent to low salt diet.  Blood pressure is well controlled at home. Cardiac symptoms none. Patient denies chest pain, chest pressure/discomfort, claudication, dyspnea, exertional chest pressure/discomfort, fatigue, irregular heart beat, lower extremity edema, near-syncope, orthopnea, palpitations and paroxysmal nocturnal dyspnea.  Cardiovascular risk factors: advanced age (older than 5 for men, 59 for women), hypertension and male gender. Use of agents associated with hypertension: NSAIDS. History of target organ damage: none. BP Readings from Last 3 Encounters:  08/12/18 128/70  08/05/18 (!) 102/54  05/06/18 128/70   Arthritis and numbness of hands Pt reports that his knees are hurting on both sides He also has bilateral numbness and tingling of both hands He denies any swelling of the wrists He denies weakness of the hands He takes meloxicam for his knee pain which works for him He was not taking the gabapentin for his numbness and tingling of his hands  Erectile Dysfunction Pt reports that he tried Cialis 5mg   It helped his erectile dysfunction He would like a refill No side effects from cialis His testosterone was low in March 2019  Component     Latest Ref Rng & Units 03/14/2018  Testosterone, total     264.0 - 916.0 ng/dL 207.9 (L)  Testosterone Free     6.6 - 18.1 pg/mL 8.9  Sex Horm Binding Glob, Serum     19.3 - 76.4 nmol/L 25.7     Past Medical History:  Diagnosis Date  . Arthritis 02/24/2014   arms, hands  . Class 1 obesity due to excess calories without serious comorbidity with body mass index (BMI) of 32.0 to 32.9 in adult 06/12/2017  . Environmental allergies 02/06/2017  . HLD (hyperlipidemia)   . HTN (hypertension)   . Hypercholesteremia 01/23/2012  .  Mixed hyperlipidemia 06/12/2017  . Numbness and tingling in hands 05/01/2014   04/17/2014. [place].  Dorna Leitz, MD.  Radiographs: These do reveal obvious degenerative disc disease at C5-6. There is no instability noted. Plan: We will proceed with getting surgery set up at patient's earliest convenience.   . Prediabetes 06/12/2017    Current Outpatient Medications  Medication Sig Dispense Refill  . amLODipine (NORVASC) 10 MG tablet Take 1 tablet (10 mg total) by mouth daily. 90 tablet 1  . aspirin 81 MG tablet Take 81 mg by mouth daily.    . cyclobenzaprine (FLEXERIL) 10 MG tablet Take 1 tablet (10 mg total) by mouth 3 (three) times daily as needed for muscle spasms. 30 tablet 1  . gabapentin (NEURONTIN) 300 MG capsule Take 1 capsule (300 mg total) by mouth at bedtime. For tingling and numbness 90 capsule 1  . losartan-hydrochlorothiazide (HYZAAR) 100-25 MG tablet Take 1 tablet by mouth daily. 90 tablet 1  . meloxicam (MOBIC) 15 MG tablet Take 1 tablet (15 mg total) by mouth daily. With food 90 tablet 3  . rosuvastatin (CRESTOR) 20 MG tablet TAKE 1 TABLET BY MOUTH EVERY DAY 90 tablet 1  . potassium chloride SA (KLOR-CON M20) 20 MEQ tablet Take 1 tablet (20 mEq total) by mouth 2 (two) times daily. (Patient not taking: Reported on 08/12/2018) 60 tablet 0  . tadalafil (CIALIS) 5 MG tablet Take 1 tablet (5 mg total) by mouth daily as needed for erectile  dysfunction. 10 tablet 11   No current facility-administered medications for this visit.     Allergies: No Known Allergies  Past Surgical History:  Procedure Laterality Date  . COLONOSCOPY    . NECK SURGERY  May 19 2014  . none      Social History   Socioeconomic History  . Marital status: Married    Spouse name: Not on file  . Number of children: 2  . Years of education: Not on file  . Highest education level: Not on file  Occupational History  . Occupation: MANAGEMENT    Employer:  [employer]  Social Needs  . Financial resource strain: Not on file  . Food insecurity:    Worry: Not on file    Inability: Not on file  . Transportation needs:    Medical: Not on file    Non-medical: Not on file  Tobacco Use  . Smoking status: Never Smoker  . Smokeless tobacco: Current User    Types: Snuff  Substance and Sexual Activity  . Alcohol use: Yes    Alcohol/week: 12.0 standard drinks    Types: 6 Cans of beer, 4 Shots of liquor, 2 Standard drinks or equivalent per week  . Drug use: No  . Sexual activity: Not on file  Lifestyle  . Physical activity:    Days per week: Not on file    Minutes per session: Not on file  . Stress: Not on file  Relationships  . Social connections:    Talks on phone: Not on file    Gets together: Not on file    Attends religious service: Not on file    Active member of club or organization: Not on file    Attends meetings of clubs or organizations: Not on file    Relationship status: Not on file  Other Topics Concern  . Not on file  Social History Narrative  . Not on file    Family History  Problem Relation Age of Onset  . Stomach cancer Father   . Cancer Unknown   . Hypertension Unknown   . Colon cancer Neg Hx   . Esophageal cancer Neg Hx   . Rectal cancer Neg Hx      ROS Review of Systems See HPI Constitution: No fevers or chills No malaise No diaphoresis Skin: No rash or itching Eyes: no blurry vision, no double vision GU: no dysuria or hematuria Neuro: no dizziness or headaches * all others reviewed and negative   Objective: Vitals:   08/12/18 0815  BP: 128/70  Pulse: 69  Resp: 17  Temp: 98.5 F (36.9 C)  TempSrc: Oral  SpO2: 97%  Weight: 199 lb 3.2 oz (90.4 kg)  Height: 5\' 6"  (1.676 m)    Physical Exam  Constitutional: He is oriented to person, place, and time. He appears well-developed and well-nourished.  HENT:  Head: Normocephalic and atraumatic.  Eyes: Conjunctivae and EOM are normal.    Cardiovascular: Normal rate, regular rhythm and normal heart sounds.  No murmur heard. Pulmonary/Chest: Breath sounds normal. No stridor. No respiratory distress. He has no wheezes.  Neurological: He is alert and oriented to person, place, and time.  tinnel's neg, phalen's neg  Skin: Skin is warm. Capillary refill takes less than 2 seconds.  Psychiatric: He has a normal mood and affect. His behavior is normal. Judgment and thought content normal.    Assessment and Plan Sonia was seen today for hypertension.  Diagnoses and all orders for this visit:  Encounter for medication monitoring -     Basic metabolic panel  Essential hypertension- Patient's blood pressure is at goal of 139/89 or less. Condition is stable. Continue current medications and treatment plan. I recommend that you exercise for 30-45 minutes 5 days a week. I also recommend a balanced diet with fruits and vegetables every day, lean meats, and little fried foods. The DASH diet (you can find this online) is a good example of this.  -     Basic metabolic panel -     amLODipine (NORVASC) 10 MG tablet; Take 1 tablet (10 mg total) by mouth daily. -     losartan-hydrochlorothiazide (HYZAAR) 100-25 MG tablet; Take 1 tablet by mouth daily.  Arthritis- stable on meloxicam, cpm, will check renal function  -     meloxicam (MOBIC) 15 MG tablet; Take 1 tablet (15 mg total) by mouth daily. With food  Combined arterial insufficiency and corporo-venous occlusive erectile dysfunction- refilled Cialis -     tadalafil (CIALIS) 5 MG tablet; Take 1 tablet (5 mg total) by mouth daily as needed for erectile dysfunction.  Numbness and tingling in both hands- discussed treatment option Will check b12 Will increase gabapentin to 300mg  only at bedtime -     gabapentin (NEURONTIN) 300 MG capsule; Take 1 capsule (300 mg total) by mouth at bedtime. For tingling and numbness -     Vitamin B12  Dyslipidemia -     rosuvastatin (CRESTOR) 20 MG  tablet; TAKE 1 TABLET BY MOUTH EVERY DAY     Zoe A Stallings

## 2018-08-12 NOTE — Patient Instructions (Signed)
° ° ° °  If you have lab work done today you will be contacted with your lab results within the next 2 weeks.  If you have not heard from us then please contact us. The fastest way to get your results is to register for My Chart. ° ° °IF you received an x-ray today, you will receive an invoice from [facility]. Please contact [facility] at [phone] with questions or concerns regarding your invoice.  ° °IF you received labwork today, you will receive an invoice from [facility]. Please contact [facility] at [phone] with questions or concerns regarding your invoice.  ° °Our billing staff will not be able to assist you with questions regarding bills from these companies. ° °You will be contacted with the lab results as soon as they are available. The fastest way to get your results is to activate your My Chart account. Instructions are located on the last page of this paperwork. If you have not heard from us regarding the results in 2 weeks, please contact this office. °  ° ° ° °

## 2018-08-14 LAB — VITAMIN B12: Vitamin B-12: 284 pg/mL (ref 232–1245)

## 2018-08-14 LAB — BASIC METABOLIC PANEL
BUN/Creatinine Ratio: 14 (ref 10–24)
BUN: 17 mg/dL (ref 8–27)
CALCIUM: 9.8 mg/dL (ref 8.6–10.2)
CHLORIDE: 101 mmol/L (ref 96–106)
CO2: 27 mmol/L (ref 20–29)
Creatinine, Ser: 1.19 mg/dL (ref 0.76–1.27)
GFR calc non Af Amer: 64 mL/min/{1.73_m2} (ref >59)
GFR, EST AFRICAN AMERICAN: 74 mL/min/{1.73_m2} (ref >59)
GLUCOSE: 104 mg/dL — AB (ref 65–99)
POTASSIUM: 3.6 mmol/L (ref 3.5–5.2)
Sodium: 144 mmol/L (ref 134–144)

## 2018-08-14 LAB — TESTT+TESTF+SHBG
Sex Hormone Binding: 31.4 nmol/L (ref 19.3–76.4)
TESTOSTERONE, TOTAL: 198.3 ng/dL — AB (ref 264.0–916.0)
Testosterone, Free: 8.2 pg/mL (ref 6.6–18.1)

## 2018-08-23 MED ORDER — TESTOSTERONE CYPIONATE 200 MG/ML IM SOLN
200.0000 mg | INTRAMUSCULAR | Status: DC
  Administered 2018-08-30 – 2018-09-30 (×3): 200 mg via INTRAMUSCULAR

## 2018-08-23 NOTE — Addendum Note (Signed)
Addended by: Delia Chimes A on: 08/23/2018 02:19 PM   Modules accepted: Orders

## 2018-08-23 NOTE — Addendum Note (Signed)
Addended by: Delia Chimes A on: 08/23/2018 02:05 PM   Modules accepted: Orders

## 2018-08-29 ENCOUNTER — Other Ambulatory Visit: Payer: Self-pay

## 2018-08-29 ENCOUNTER — Ambulatory Visit (INDEPENDENT_AMBULATORY_CARE_PROVIDER_SITE_OTHER): Payer: Medicare HMO | Admitting: Family Medicine

## 2018-08-29 ENCOUNTER — Encounter: Payer: Self-pay | Admitting: Family Medicine

## 2018-08-29 VITALS — BP 128/76 | HR 78 | Temp 98.8°F | Resp 17 | Ht 66.0 in | Wt 198.6 lb

## 2018-08-29 DIAGNOSIS — Z79890 Hormone replacement therapy (postmenopausal): Secondary | ICD-10-CM | POA: Diagnosis not present

## 2018-08-29 DIAGNOSIS — N5203 Combined arterial insufficiency and corporo-venous occlusive erectile dysfunction: Secondary | ICD-10-CM | POA: Diagnosis not present

## 2018-08-29 DIAGNOSIS — R7989 Other specified abnormal findings of blood chemistry: Secondary | ICD-10-CM

## 2018-08-29 DIAGNOSIS — Z5181 Encounter for therapeutic drug level monitoring: Secondary | ICD-10-CM

## 2018-08-29 MED ORDER — TESTOSTERONE CYPIONATE 200 MG/ML IM SOLN: 200.0000 mg | INTRAMUSCULAR | 0 refills | Status: DC

## 2018-08-29 NOTE — Progress Notes (Signed)
Chief Complaint  Patient presents with  . discuss labs    HPI  Patient has decreasing levels of testosterone States that he has some erectile dysfunction but denies depression He still works and has a relationship with a woman He was prescribed Cialis in the past He has controlled hypertension, dyslipidemia that is also controlled and is prediabetic  Component     Latest Ref Rng & Units 01/31/2018 03/14/2018 08/12/2018  Testosterone, total     264.0 - 916.0 ng/dL  207.9 (L) 198.3 (L)  Testosterone Free     6.6 - 18.1 pg/mL  8.9 8.2  Sex Horm Binding Glob, Serum     19.3 - 76.4 nmol/L  25.7 31.4  Prostate Specific Ag, Serum     0.0 - 4.0 ng/mL 1.7    TSH     0.450 - 4.500 uIU/mL  1.050    Depression screen [facility] 2/9 08/29/2018 08/12/2018 05/06/2018 03/14/2018 01/31/2018  Decreased Interest 0 0 0 0 0  Down, Depressed, Hopeless 0 0 0 0 0  PHQ - 2 Score 0 0 0 0 0     Past Medical History:  Diagnosis Date  . Arthritis 02/24/2014   arms, hands  . Class 1 obesity due to excess calories without serious comorbidity with body mass index (BMI) of 32.0 to 32.9 in adult 06/12/2017  . Environmental allergies 02/06/2017  . HLD (hyperlipidemia)   . HTN (hypertension)   . Hypercholesteremia 01/23/2012  . Mixed hyperlipidemia 06/12/2017  . Numbness and tingling in hands 05/01/2014   04/17/2014. [place].  Dorna Leitz, MD.  Radiographs: These do reveal obvious degenerative disc disease at C5-6. There is no instability noted. Plan: We will proceed with getting surgery set up at patient's earliest convenience.   . Prediabetes 06/12/2017    Current Outpatient Medications  Medication Sig Dispense Refill  . amLODipine (NORVASC) 10 MG tablet Take 1 tablet (10 mg total) by mouth daily. 90 tablet 1  . aspirin 81 MG tablet Take 81 mg by mouth daily.    . cyclobenzaprine (FLEXERIL) 10 MG tablet Take 1 tablet (10 mg total) by mouth 3 (three) times daily as needed for  muscle spasms. 30 tablet 1  . gabapentin (NEURONTIN) 300 MG capsule Take 1 capsule (300 mg total) by mouth at bedtime. For tingling and numbness 90 capsule 1  . losartan-hydrochlorothiazide (HYZAAR) 100-25 MG tablet Take 1 tablet by mouth daily. 90 tablet 1  . meloxicam (MOBIC) 15 MG tablet Take 1 tablet (15 mg total) by mouth daily. With food 90 tablet 3  . rosuvastatin (CRESTOR) 20 MG tablet TAKE 1 TABLET BY MOUTH EVERY DAY 90 tablet 1  . tadalafil (CIALIS) 5 MG tablet Take 1 tablet (5 mg total) by mouth daily as needed for erectile dysfunction. 10 tablet 11   Current Facility-Administered Medications  Medication Dose Route Frequency Provider Last Rate Last Dose  . testosterone cypionate (DEPOTESTOSTERONE CYPIONATE) injection 200 mg  200 mg Intramuscular Q14 Days Forrest Moron, MD        Allergies: No Known Allergies  Past Surgical History:  Procedure Laterality Date  . COLONOSCOPY    . NECK SURGERY  May 19 2014  . none      Social History   Socioeconomic History  . Marital status: Married    Spouse name: Not on file  . Number of children: 2  . Years of education: Not on file  . Highest education level: Not on file  Occupational  History  . Occupation: MANAGEMENT    Employer: [employer]  Social Needs  . Financial resource strain: Not on file  . Food insecurity:    Worry: Not on file    Inability: Not on file  . Transportation needs:    Medical: Not on file    Non-medical: Not on file  Tobacco Use  . Smoking status: Never Smoker  . Smokeless tobacco: Current User    Types: Snuff  Substance and Sexual Activity  . Alcohol use: Yes    Alcohol/week: 12.0 standard drinks    Types: 6 Cans of beer, 4 Shots of liquor, 2 Standard drinks or equivalent per week  . Drug use: No  . Sexual activity: Not on file  Lifestyle  . Physical activity:    Days per week: Not on file    Minutes per session: Not on file  . Stress: Not on file  Relationships  . Social  connections:    Talks on phone: Not on file    Gets together: Not on file    Attends religious service: Not on file    Active member of club or organization: Not on file    Attends meetings of clubs or organizations: Not on file    Relationship status: Not on file  Other Topics Concern  . Not on file  Social History Narrative  . Not on file    Family History  Problem Relation Age of Onset  . Stomach cancer Father   . Cancer Unknown   . Hypertension Unknown   . Colon cancer Neg Hx   . Esophageal cancer Neg Hx   . Rectal cancer Neg Hx      ROS Review of Systems See HPI Constitution: No fevers or chills No malaise No diaphoresis Skin: No rash or itching Eyes: no blurry vision, no double vision GU: no dysuria or hematuria Neuro: no dizziness or headaches all others reviewed and negative   Objective: Vitals:   08/29/18 0918  BP: 128/76  Pulse: 78  Resp: 17  Temp: 98.8 F (37.1 C)  TempSrc: Oral  SpO2: 98%  Weight: 198 lb 9.6 oz (90.1 kg)  Height: 5\' 6"  (1.676 m)    Physical Exam  Constitutional: He is oriented to person, place, and time. He appears well-developed and well-nourished.  HENT:  Head: Normocephalic and atraumatic.  Eyes: Conjunctivae and EOM are normal.  Cardiovascular: Normal rate, regular rhythm and normal heart sounds.  Pulmonary/Chest: Effort normal and breath sounds normal. No stridor. No respiratory distress. He has no wheezes.  Neurological: He is alert and oriented to person, place, and time.  Psychiatric: He has a normal mood and affect. His behavior is normal. Judgment and thought content normal.    Assessment and Plan Raykwon was seen today for discuss labs.  Diagnoses and all orders for this visit:  Low testosterone -     Cancel: PSA -     Prolactin -     Cortisol, free, Serum -     FSH/LH -     TestT+TestF+SHBG  Encounter for monitoring testosterone replacement therapy -     Cancel: PSA  Combined arterial insufficiency and  corporo-venous occlusive erectile dysfunction -     FSH/LH -     TestT+TestF+SHBG   Discussed with patient that testosterone deficiency is linked to reduced energy, endurance, hair growth, lean muscle mass, cognitive function, memory, concentration, sex drive, erectile dysfunction.   Discussed that testosterone supplementation can lead to growth of the prostate (  his PSA was normal 7 months ago), polycycthemia, hyperglycemia, CAD He agrees to supplementation and understands the medicine is given IM every 2 weeks with monitoring in 4 cycles (2 months after starting treatment).   A total of 25 minutes were spent face-to-face with the patient during this encounter and over half of that time was spent on counseling and coordination of care.  [place]

## 2018-08-29 NOTE — Patient Instructions (Addendum)
If you have lab work done today you will be contacted with your lab results within the next 2 weeks.  If you have not heard from [place] then please contact us. The fastest way to get your results is to register for My Chart.   IF you received an x-ray today, you will receive an invoice from [facility]. Please contact [facility] at [phone] with questions or concerns regarding your invoice.   IF you received labwork today, you will receive an invoice from [place]. Please contact [facility] at [phone] with questions or concerns regarding your invoice.   Our billing staff will not be able to assist you with questions regarding bills from these companies.  You will be contacted with the lab results as soon as they are available. The fastest way to get your results is to activate your My Chart account. Instructions are located on the last page of this paperwork. If you have not heard from [place] regarding the results in 2 weeks, please contact this office.    Testosterone injection What is this medicine? TESTOSTERONE (tes TOS ter one) is the main male hormone. It supports normal male development such as muscle growth, facial hair, and deep voice. It is used in males to treat low testosterone levels. This medicine may be used for other purposes; ask your health care provider or pharmacist if you have questions. COMMON BRAND NAME(S): Andro-L.A., Aveed, Delatestryl, Depo-Testosterone, Virilon What should I tell my health care provider before I take this medicine? They need to know if you have any of these conditions: -cancer -diabetes -heart disease -kidney disease -liver disease -lung disease -prostate disease -an unusual or allergic reaction to testosterone, other medicines, foods, dyes, or preservatives -pregnant or trying to get pregnant -breast-feeding How should I use this medicine? This medicine is for injection into a muscle. It is usually given by a health  care professional in a hospital or clinic setting. Contact your pediatrician regarding the use of this medicine in children. While this medicine may be prescribed for children as young as 49 years of age for selected conditions, precautions do apply. Overdosage: If you think you have taken too much of this medicine contact a poison control center or emergency room at once. NOTE: This medicine is only for you. Do not share this medicine with others. What if I miss a dose? Try not to miss a dose. Your doctor or health care professional will tell you when your next injection is due. Notify the office if you are unable to keep an appointment. What may interact with this medicine? -medicines for diabetes -medicines that treat or prevent blood clots like warfarin -oxyphenbutazone -propranolol -steroid medicines like prednisone or cortisone This list may not describe all possible interactions. Give your health care provider a list of all the medicines, herbs, non-prescription drugs, or dietary supplements you use. Also tell them if you smoke, drink alcohol, or use illegal drugs. Some items may interact with your medicine. What should I watch for while using this medicine? Visit your doctor or health care professional for regular checks on your progress. They will need to check the level of testosterone in your blood. This medicine is only approved for use in men who have low levels of testosterone related to certain medical conditions. Heart attacks and strokes have been reported with the use of this medicine. Notify your doctor or health care professional and seek emergency treatment if you develop breathing problems; changes in vision; confusion; chest  pain or chest tightness; sudden arm pain; severe, sudden headache; trouble speaking or understanding; sudden numbness or weakness of the face, arm or leg; loss of balance or coordination. Talk to your doctor about the risks and benefits of this  medicine. This medicine may affect blood sugar levels. If you have diabetes, check with your doctor or health care professional before you change your diet or the dose of your diabetic medicine. Testosterone injections are not commonly used in women. Women should inform their doctor if they wish to become pregnant or think they might be pregnant. There is a potential for serious side effects to an unborn child. Talk to your health care professional or pharmacist for more information. Talk with your doctor or health care professional about your birth control options while taking this medicine. This drug is banned from use in athletes by most athletic organizations. What side effects may I notice from receiving this medicine? Side effects that you should report to your doctor or health care professional as soon as possible: -allergic reactions like skin rash, itching or hives, swelling of the face, lips, or tongue -breast enlargement -breathing problems -changes in emotions or moods -deep or hoarse voice -irregular menstrual periods -signs and symptoms of liver injury like dark yellow or brown urine; general ill feeling or flu-like symptoms; light-colored stools; loss of appetite; nausea; right upper belly pain; unusually weak or tired; yellowing of the eyes or skin -stomach pain -swelling of the ankles, feet, hands -too frequent or persistent erections -trouble passing urine or change in the amount of urine Side effects that usually do not require medical attention (report to your doctor or health care professional if they continue or are bothersome): -acne -change in sex drive or performance -facial hair growth -hair loss -headache This list may not describe all possible side effects. Call your doctor for medical advice about side effects. You may report side effects to FDA at 1-800-FDA-1088. Where should I keep my medicine? Keep out of the reach of children. This medicine can be abused.  Keep your medicine in a safe place to protect it from theft. Do not share this medicine with anyone. Selling or giving away this medicine is dangerous and against the law. Store at room temperature between 20 and 25 degrees C (68 and 77 degrees F). Do not freeze. Protect from light. Follow the directions for the product you are prescribed. Throw away any unused medicine after the expiration date. NOTE: This sheet is a summary. It may not cover all possible information. If you have questions about this medicine, talk to your doctor, pharmacist, or health care provider.  2018 Elsevier/Gold Standard (2016-01-08 07:33:55)

## 2018-08-30 ENCOUNTER — Ambulatory Visit (INDEPENDENT_AMBULATORY_CARE_PROVIDER_SITE_OTHER): Payer: Medicare HMO | Admitting: Family Medicine

## 2018-08-30 VITALS — BP 122/79 | HR 69 | Temp 98.0°F

## 2018-08-30 DIAGNOSIS — R7989 Other specified abnormal findings of blood chemistry: Secondary | ICD-10-CM | POA: Diagnosis not present

## 2018-08-30 NOTE — Progress Notes (Signed)
Patient ID: Bryce Rodriguez, male   DOB: Nov 07, 1951, 67 y.o.   MRN: [id number] Nurse visit - Patient is here for testosterone 1 ml injection only. Injection is administered every 14 days.

## 2018-09-05 LAB — PROLACTIN: PROLACTIN: 16.9 ng/mL — AB (ref 4.0–15.2)

## 2018-09-05 LAB — FSH/LH
FSH: 2.4 m[IU]/mL (ref 1.5–12.4)
LH: 6.5 m[IU]/mL (ref 1.7–8.6)

## 2018-09-05 LAB — TESTT+TESTF+SHBG
Sex Hormone Binding: 29 nmol/L (ref 19.3–76.4)
TESTOSTERONE, TOTAL: 144.2 ng/dL — AB (ref 264.0–916.0)
Testosterone, Free: 10.5 pg/mL (ref 6.6–18.1)

## 2018-09-05 LAB — CORTISOL, FREE: CORTISOL, FREE DIALYSIS, LCMS: 0.545 ug/dL

## 2018-09-13 ENCOUNTER — Ambulatory Visit (INDEPENDENT_AMBULATORY_CARE_PROVIDER_SITE_OTHER): Payer: Medicare HMO | Admitting: Family Medicine

## 2018-09-13 DIAGNOSIS — R7989 Other specified abnormal findings of blood chemistry: Secondary | ICD-10-CM

## 2018-09-13 NOTE — Progress Notes (Signed)
Patient ID: Bryce Rodriguez, male   DOB: 1951-03-20, 67 y.o.   MRN: [id number]   Patient here for testosterone injection only. administered 1 ml of 200 mg/ml to the LUQ.

## 2018-09-30 ENCOUNTER — Ambulatory Visit (INDEPENDENT_AMBULATORY_CARE_PROVIDER_SITE_OTHER): Payer: Medicare HMO | Admitting: Family Medicine

## 2018-09-30 VITALS — BP 142/80 | Temp 98.2°F

## 2018-09-30 DIAGNOSIS — R7989 Other specified abnormal findings of blood chemistry: Secondary | ICD-10-CM

## 2018-10-14 ENCOUNTER — Other Ambulatory Visit: Payer: Self-pay | Admitting: Family Medicine

## 2018-10-14 DIAGNOSIS — R7989 Other specified abnormal findings of blood chemistry: Secondary | ICD-10-CM

## 2018-10-15 NOTE — Telephone Encounter (Signed)
Requested medication (s) are due for refill today: yes  Requested medication (s) are on the active medication list: yes  Last refill:  08/29/18  Future visit scheduled: yes  Notes to clinic:  No protocol    Requested Prescriptions  Pending Prescriptions Disp Refills   testosterone cypionate (DEPOTESTOSTERONE CYPIONATE) 200 MG/ML injection [Pharmacy Med Name: TESTOSTERON CYP 2,000 MG/10 ML] 10 mL 0    Sig: INJECT 1 ML (200 MG TOTAL) INTO THE MUSCLE EVERY 14 DAYS     Off-Protocol Failed - 10/14/2018  1:11 PM      Failed - Medication not assigned to a protocol, review manually.      Passed - Valid encounter within last 12 months    Recent Outpatient Visits          2 weeks ago Low testosterone   Primary Care at [facility], Arlie Solomons, MD   1 month ago Low testosterone   [facility] at [facility], Lilia Argue, MD   1 month ago Low testosterone   Primary Care at [place], MD   1 month ago Low testosterone   Primary Care at [facility], Arlie Solomons, MD   2 months ago Essential hypertension   Primary Care at [place], MD      Future Appointments            In 2 weeks Forrest Moron, MD Primary Care at [place], [facility]   In 4 months Forrest Moron, MD Primary Care at [place], [facility]

## 2018-11-04 ENCOUNTER — Ambulatory Visit: Payer: Medicare HMO | Admitting: Family Medicine

## 2018-11-04 NOTE — Progress Notes (Deleted)
No chief complaint on file.   HPI  4 review of systems  Past Medical History:  Diagnosis Date  . Arthritis 02/24/2014   arms, hands  . Class 1 obesity due to excess calories without serious comorbidity with body mass index (BMI) of 32.0 to 32.9 in adult 06/12/2017  . Environmental allergies 02/06/2017  . HLD (hyperlipidemia)   . HTN (hypertension)   . Hypercholesteremia 01/23/2012  . Mixed hyperlipidemia 06/12/2017  . Numbness and tingling in hands 05/01/2014   04/17/2014. [place].  Bryce Leitz, MD.  Radiographs: These do reveal obvious degenerative disc disease at C5-6. There is no instability noted. Plan: We will proceed with getting surgery set up at patient's earliest convenience.   . Prediabetes 06/12/2017    Current Outpatient Medications  Medication Sig Dispense Refill  . amLODipine (NORVASC) 10 MG tablet Take 1 tablet (10 mg total) by mouth daily. 90 tablet 1  . aspirin 81 MG tablet Take 81 mg by mouth daily.    . cyclobenzaprine (FLEXERIL) 10 MG tablet Take 1 tablet (10 mg total) by mouth 3 (three) times daily as needed for muscle spasms. 30 tablet 1  . gabapentin (NEURONTIN) 300 MG capsule Take 1 capsule (300 mg total) by mouth at bedtime. For tingling and numbness 90 capsule 1  . losartan-hydrochlorothiazide (HYZAAR) 100-25 MG tablet Take 1 tablet by mouth daily. 90 tablet 1  . meloxicam (MOBIC) 15 MG tablet Take 1 tablet (15 mg total) by mouth daily. With food 90 tablet 3  . rosuvastatin (CRESTOR) 20 MG tablet TAKE 1 TABLET BY MOUTH EVERY DAY 90 tablet 1  . tadalafil (CIALIS) 5 MG tablet Take 1 tablet (5 mg total) by mouth daily as needed for erectile dysfunction. 10 tablet 11  . testosterone cypionate (DEPOTESTOSTERONE CYPIONATE) 200 MG/ML injection Inject 1 mL (200 mg total) into the muscle every 14 (fourteen) days. 10 mL 0   Current Facility-Administered Medications  Medication Dose Route Frequency Provider Last Rate Last Dose  .  testosterone cypionate (DEPOTESTOSTERONE CYPIONATE) injection 200 mg  200 mg Intramuscular Q14 Days Bryce Rodriguez, Bryce A, MD   200 mg at 09/30/18 1050    Allergies: No Known Allergies  Past Surgical History:  Procedure Laterality Date  . COLONOSCOPY    . NECK SURGERY  May 19 2014  . none      Social History   Socioeconomic History  . Marital status: Married    Spouse name: Not on file  . Number of children: 2  . Years of education: Not on file  . Highest education level: Not on file  Occupational History  . Occupation: MANAGEMENT    Employer: [employer]  Social Needs  . Financial resource strain: Not on file  . Food insecurity:    Worry: Not on file    Inability: Not on file  . Transportation needs:    Medical: Not on file    Non-medical: Not on file  Tobacco Use  . Smoking status: Never Smoker  . Smokeless tobacco: Current User    Types: Snuff  Substance and Sexual Activity  . Alcohol use: Yes    Alcohol/week: 12.0 standard drinks    Types: 6 Cans of beer, 4 Shots of liquor, 2 Standard drinks or equivalent per week  . Drug use: No  . Sexual activity: Not on file  Lifestyle  . Physical activity:    Days per week: Not on file    Minutes per session: Not on file  .  Stress: Not on file  Relationships  . Social connections:    Talks on phone: Not on file    Gets together: Not on file    Attends religious service: Not on file    Active member of club or organization: Not on file    Attends meetings of clubs or organizations: Not on file    Relationship status: Not on file  Other Topics Concern  . Not on file  Social History Narrative  . Not on file    Family History  Problem Relation Age of Onset  . Stomach cancer Father   . Cancer Unknown   . Hypertension Unknown   . Colon cancer Neg Hx   . Esophageal cancer Neg Hx   . Rectal cancer Neg Hx      ROS Review of Systems See HPI Constitution: No fevers or chills No malaise No diaphoresis Skin:  No rash or itching Eyes: no blurry vision, no double vision GU: no dysuria or hematuria Neuro: no dizziness or headaches * all others reviewed and negative   Objective: There were no vitals filed for this visit.  Physical Exam  Assessment and Plan There are no diagnoses linked to this encounter.   Bryce Rodriguez P Bryce Rodriguez

## 2018-11-21 ENCOUNTER — Telehealth: Payer: Self-pay

## 2018-11-21 ENCOUNTER — Ambulatory Visit (INDEPENDENT_AMBULATORY_CARE_PROVIDER_SITE_OTHER): Payer: Medicare HMO | Admitting: Family Medicine

## 2018-11-21 ENCOUNTER — Ambulatory Visit: Payer: Medicare HMO

## 2018-11-21 ENCOUNTER — Encounter: Payer: Self-pay | Admitting: Family Medicine

## 2018-11-21 ENCOUNTER — Other Ambulatory Visit: Payer: Self-pay

## 2018-11-21 VITALS — BP 134/79 | HR 69 | Temp 98.2°F | Resp 16 | Ht 66.0 in | Wt 205.0 lb

## 2018-11-21 DIAGNOSIS — R202 Paresthesia of skin: Secondary | ICD-10-CM

## 2018-11-21 DIAGNOSIS — M1990 Unspecified osteoarthritis, unspecified site: Secondary | ICD-10-CM | POA: Diagnosis not present

## 2018-11-21 DIAGNOSIS — N5203 Combined arterial insufficiency and corporo-venous occlusive erectile dysfunction: Secondary | ICD-10-CM

## 2018-11-21 DIAGNOSIS — E785 Hyperlipidemia, unspecified: Secondary | ICD-10-CM | POA: Diagnosis not present

## 2018-11-21 DIAGNOSIS — R200 Anesthesia of skin: Secondary | ICD-10-CM

## 2018-11-21 DIAGNOSIS — Z5181 Encounter for therapeutic drug level monitoring: Secondary | ICD-10-CM | POA: Insufficient documentation

## 2018-11-21 DIAGNOSIS — R7989 Other specified abnormal findings of blood chemistry: Secondary | ICD-10-CM

## 2018-11-21 DIAGNOSIS — Z23 Encounter for immunization: Secondary | ICD-10-CM | POA: Diagnosis not present

## 2018-11-21 DIAGNOSIS — I10 Essential (primary) hypertension: Secondary | ICD-10-CM

## 2018-11-21 DIAGNOSIS — Z79890 Hormone replacement therapy (postmenopausal): Secondary | ICD-10-CM | POA: Diagnosis not present

## 2018-11-21 DIAGNOSIS — M19019 Primary osteoarthritis, unspecified shoulder: Secondary | ICD-10-CM | POA: Diagnosis not present

## 2018-11-21 MED ORDER — LOSARTAN POTASSIUM-HCTZ 100-25 MG PO TABS: 1.0000 | ORAL_TABLET | Freq: Every day | ORAL | 1 refills | Status: DC

## 2018-11-21 MED ORDER — TESTOSTERONE CYPIONATE 200 MG/ML IM SOLN: 200.0000 mg | INTRAMUSCULAR | 5 refills | Status: AC

## 2018-11-21 MED ORDER — GABAPENTIN 300 MG PO CAPS: 300.0000 mg | ORAL_CAPSULE | Freq: Every day | ORAL | 1 refills | Status: DC

## 2018-11-21 MED ORDER — AMLODIPINE BESYLATE 10 MG PO TABS: 10.0000 mg | ORAL_TABLET | Freq: Every day | ORAL | 1 refills | Status: DC

## 2018-11-21 MED ORDER — MELOXICAM 15 MG PO TABS: 15.0000 mg | ORAL_TABLET | Freq: Every day | ORAL | 3 refills | Status: DC

## 2018-11-21 MED ORDER — ROSUVASTATIN CALCIUM 20 MG PO TABS: ORAL_TABLET | ORAL | 1 refills | Status: DC

## 2018-11-21 NOTE — Assessment & Plan Note (Signed)
Continue statin Crestor, will check liver enzymes Pt is on testosterone which can impact his lipids thus close monitoring of his heart disease risk factors will be performed

## 2018-11-21 NOTE — Assessment & Plan Note (Signed)
Patient is continued on gabapentin. Notes from [place].

## 2018-11-21 NOTE — Assessment & Plan Note (Signed)
Patient's blood pressure is at goal of 139/89 or less. Condition is stable. Continue current medications and treatment plan. I recommend that you exercise for 30-45 minutes 5 days a week. I also recommend a balanced diet with fruits and vegetables every day, lean meats, and little fried foods. The DASH diet (you can find this online) is a good example of this.  

## 2018-11-21 NOTE — Assessment & Plan Note (Signed)
Will assess testosterone levels to see if levels have improved

## 2018-11-21 NOTE — Patient Instructions (Signed)
° ° ° °  If you have lab work done today you will be contacted with your lab results within the next 2 weeks.  If you have not heard from us then please contact us. The fastest way to get your results is to register for My Chart. ° ° °IF you received an x-ray today, you will receive an invoice from [facility]. Please contact [facility] at [phone] with questions or concerns regarding your invoice.  ° °IF you received labwork today, you will receive an invoice from [facility]. Please contact [facility] at [phone] with questions or concerns regarding your invoice.  ° °Our billing staff will not be able to assist you with questions regarding bills from these companies. ° °You will be contacted with the lab results as soon as they are available. The fastest way to get your results is to activate your My Chart account. Instructions are located on the last page of this paperwork. If you have not heard from us regarding the results in 2 weeks, please contact this office. °  ° ° ° °

## 2018-11-21 NOTE — Progress Notes (Signed)
Chief Complaint  Patient presents with  . testosterone f/u    HPI  Patient reports that he has been off his testosterone since 09/13/18 He states that he was told that his vial of testosterone was not going to be used since it had been opened since 08/30/18 States that he feels fine Lab Results  Component Value Date   TESTOSTERONE 144.2 (L) 08/29/2018    Wt Readings from Last 3 Encounters:  11/21/18 205 lb (93 kg)  08/29/18 198 lb 9.6 oz (90.1 kg)  08/12/18 199 lb 3.2 oz (90.4 kg)   He states that he is having increasing shoulder pain at bedtime Reports that he has been able to work but states that at night the pain is throbbing but the numbness and tingling is better with gabapentin. States that he takes meloxicam     Past Medical History:  Diagnosis Date  . Arthritis 02/24/2014   arms, hands  . Class 1 obesity due to excess calories without serious comorbidity with body mass index (BMI) of 32.0 to 32.9 in adult 06/12/2017  . Environmental allergies 02/06/2017  . HLD (hyperlipidemia)   . HTN (hypertension)   . Hypercholesteremia 01/23/2012  . Mixed hyperlipidemia 06/12/2017  . Numbness and tingling in hands 05/01/2014   04/17/2014. [place].  Dorna Leitz, MD.  Radiographs: These do reveal obvious degenerative disc disease at C5-6. There is no instability noted. Plan: We will proceed with getting surgery set up at patient's earliest convenience.   . Prediabetes 06/12/2017    Current Outpatient Medications  Medication Sig Dispense Refill  . amLODipine (NORVASC) 10 MG tablet Take 1 tablet (10 mg total) by mouth daily. 90 tablet 1  . aspirin 81 MG tablet Take 81 mg by mouth daily.    . cyclobenzaprine (FLEXERIL) 10 MG tablet Take 1 tablet (10 mg total) by mouth 3 (three) times daily as needed for muscle spasms. 30 tablet 1  . gabapentin (NEURONTIN) 300 MG capsule Take 1 capsule (300 mg total) by mouth at bedtime. For tingling and numbness 90  capsule 1  . losartan-hydrochlorothiazide (HYZAAR) 100-25 MG tablet Take 1 tablet by mouth daily. 90 tablet 1  . meloxicam (MOBIC) 15 MG tablet Take 1 tablet (15 mg total) by mouth daily. With food 90 tablet 3  . rosuvastatin (CRESTOR) 20 MG tablet TAKE 1 TABLET BY MOUTH EVERY DAY 90 tablet 1  . tadalafil (CIALIS) 5 MG tablet Take 1 tablet (5 mg total) by mouth daily as needed for erectile dysfunction. 10 tablet 11  . testosterone cypionate (DEPOTESTOSTERONE CYPIONATE) 200 MG/ML injection Inject 1 mL (200 mg total) into the muscle every 14 (fourteen) days. 2 mL 5   Current Facility-Administered Medications  Medication Dose Route Frequency Provider Last Rate Last Dose  . testosterone cypionate (DEPOTESTOSTERONE CYPIONATE) injection 200 mg  200 mg Intramuscular Q14 Days Nolon Rod, Shown Dissinger A, MD   200 mg at 09/30/18 1050    Allergies: No Known Allergies  Past Surgical History:  Procedure Laterality Date  . COLONOSCOPY    . NECK SURGERY  May 19 2014  . none      Social History   Socioeconomic History  . Marital status: Married    Spouse name: Not on file  . Number of children: 2  . Years of education: Not on file  . Highest education level: Not on file  Occupational History  . Occupation: MANAGEMENT    Employer: [employer]  Social Needs  . Emergency planning/management officer  strain: Not on file  . Food insecurity:    Worry: Not on file    Inability: Not on file  . Transportation needs:    Medical: Not on file    Non-medical: Not on file  Tobacco Use  . Smoking status: Never Smoker  . Smokeless tobacco: Current User    Types: Snuff  Substance and Sexual Activity  . Alcohol use: Yes    Alcohol/week: 12.0 standard drinks    Types: 6 Cans of beer, 4 Shots of liquor, 2 Standard drinks or equivalent per week  . Drug use: No  . Sexual activity: Not on file  Lifestyle  . Physical activity:    Days per week: Not on file    Minutes per session: Not on file  . Stress: Not on file    Relationships  . Social connections:    Talks on phone: Not on file    Gets together: Not on file    Attends religious service: Not on file    Active member of club or organization: Not on file    Attends meetings of clubs or organizations: Not on file    Relationship status: Not on file  Other Topics Concern  . Not on file  Social History Narrative  . Not on file    Family History  Problem Relation Age of Onset  . Stomach cancer Father   . Cancer Unknown   . Hypertension Unknown   . Colon cancer Neg Hx   . Esophageal cancer Neg Hx   . Rectal cancer Neg Hx      ROS Review of Systems See HPI Constitution: No fevers or chills No malaise No diaphoresis Skin: No rash or itching Eyes: no blurry vision, no double vision GU: no dysuria or hematuria Neuro: no dizziness or headaches * all others reviewed and negative   Objective: Vitals:   11/21/18 1025  BP: 134/79  Pulse: 69  Resp: 16  Temp: 98.2 F (36.8 C)  TempSrc: Oral  SpO2: 93%  Weight: 205 lb (93 kg)  Height: 5\' 6"  (1.676 m)    Physical Exam  Physical Exam  Constitutional: Oriented to person, place, and time. Appears well-developed and well-nourished.  HENT:  Head: Normocephalic and atraumatic.  Eyes: Conjunctivae and EOM are normal.  Cardiovascular: Normal rate, regular rhythm, normal heart sounds and intact distal pulses.  No murmur heard. Pulmonary/Chest: Effort normal and breath sounds normal. No stridor. No respiratory distress. Has no wheezes.  Neurological: Is alert and oriented to person, place, and time.  Skin: Skin is warm. Capillary refill takes less than 2 seconds.  Psychiatric: Has a normal mood and affect. Behavior is normal. Judgment and thought content normal.    CLINICAL DATA:  Left shoulder pain  EXAM: LEFT SHOULDER - 2+ VIEW  COMPARISON:  None.  FINDINGS: There is no fracture or dislocation. The glenohumeral joint is normal. There are mild degenerative changes of the  acromioclavicular joint.  IMPRESSION: Mild arthropathy of the left acromioclavicular joint.   Electronically Signed   By: Kathreen Devoid   On: 05/06/2018 10:12   Assessment and Plan   Problem List Items Addressed This Visit      Cardiovascular and Mediastinum   HTN (hypertension)    Patient's blood pressure is at goal of 139/89 or less. Condition is stable. Continue current medications and treatment plan. I recommend that you exercise for 30-45 minutes 5 days a week. I also recommend a balanced diet with fruits and vegetables every  day, lean meats, and little fried foods. The DASH diet (you can find this online) is a good example of this.       Relevant Medications   losartan-hydrochlorothiazide (HYZAAR) 100-25 MG tablet   amLODipine (NORVASC) 10 MG tablet   rosuvastatin (CRESTOR) 20 MG tablet     Musculoskeletal and Integument   Arthritis    Continue meloxicam, reviewed xray with patient and referral placed for Orthopedic Surgery      Relevant Medications   meloxicam (MOBIC) 15 MG tablet   Other Relevant Orders   Ambulatory referral to Orthopedic Surgery   Shoulder arthritis   Relevant Medications   meloxicam (MOBIC) 15 MG tablet   Other Relevant Orders   Ambulatory referral to Orthopedic Surgery- chronic worsening shoulder pain with xray showing arthrtis  Gabapentin helps with numbness and pain Will refer to Orthopedics for evaluation and treatment     Genitourinary   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 Notes from [place].      Relevant Medications   gabapentin (NEURONTIN) 300 MG capsule   Low testosterone - Primary   Relevant Medications   testosterone cypionate (DEPOTESTOSTERONE CYPIONATE) 200 MG/ML injection   Encounter for monitoring  testosterone replacement therapy    Will assess testosterone levels to see if levels have improved       Dyslipidemia    Continue statin Crestor, will check liver enzymes Pt is on testosterone which can impact his lipids thus close monitoring of his heart disease risk factors will be performed       Relevant Medications   rosuvastatin (CRESTOR) 20 MG tablet    Other Visit Diagnoses    Need for prophylactic vaccination and inoculation against influenza         A total of 40 minutes were spent face-to-face with the patient during this encounter and over half of that time was spent on counseling and coordination of care.   [place]

## 2018-11-21 NOTE — Assessment & Plan Note (Signed)
Continue meloxicam, reviewed xray with patient and referral placed for Orthopedic Surgery

## 2018-11-21 NOTE — Telephone Encounter (Signed)
Pt here to check with dr Nolon Rod re: testosterone multi dose vail and if its ok to continue using medication from vial.  Per our office protocol cannot give medication from multi dose vial with out opening date, unable to store medication for pt in office as this would make [place] liable for contents and since its been in pts' possession and no way of knowing if it has been tampered with and liability would lie with [place] if we gave contaminated medication unknowingly.  Advised pt it is a safety issue and we want to keep him safe and cover ourselves legally as well. Pt verbalized understanding.  Advised dr Nolon Rod has sent over new rx for single dose vials and he may come back at anytime with medication and we will administer injection.  Pt agreeable. Dg[place], CMA

## 2018-11-21 NOTE — Assessment & Plan Note (Signed)
Pt has Cialis to take as needed

## 2018-11-22 LAB — CBC
Hematocrit: 45.8 % (ref 37.5–51.0)
Hemoglobin: 14.9 g/dL (ref 13.0–17.7)
MCH: 27.5 pg (ref 26.6–33.0)
MCHC: 32.5 g/dL (ref 31.5–35.7)
MCV: 85 fL (ref 79–97)
Platelets: 249 10*3/uL (ref 150–450)
RBC: 5.41 x10E6/uL (ref 4.14–5.80)
RDW: 13.7 % (ref 12.3–15.4)
WBC: 5.9 10*3/uL (ref 3.4–10.8)

## 2018-11-22 LAB — COMPREHENSIVE METABOLIC PANEL
ALT: 81 IU/L — ABNORMAL HIGH (ref 0–44)
AST: 43 IU/L — AB (ref 0–40)
Albumin/Globulin Ratio: 2.1 (ref 1.2–2.2)
Albumin: 4.8 g/dL (ref 3.6–4.8)
Alkaline Phosphatase: 54 IU/L (ref 39–117)
BUN/Creatinine Ratio: 18 (ref 10–24)
BUN: 18 mg/dL (ref 8–27)
Bilirubin Total: 0.4 mg/dL (ref 0.0–1.2)
CHLORIDE: 95 mmol/L — AB (ref 96–106)
CO2: 28 mmol/L (ref 20–29)
Calcium: 9.9 mg/dL (ref 8.6–10.2)
Creatinine, Ser: 1 mg/dL (ref 0.76–1.27)
GFR calc Af Amer: 90 mL/min/{1.73_m2} (ref >59)
GFR calc non Af Amer: 78 mL/min/{1.73_m2} (ref >59)
GLUCOSE: 104 mg/dL — AB (ref 65–99)
Globulin, Total: 2.3 g/dL (ref 1.5–4.5)
Potassium: 3.2 mmol/L — ABNORMAL LOW (ref 3.5–5.2)
Sodium: 140 mmol/L (ref 134–144)
Total Protein: 7.1 g/dL (ref 6.0–8.5)

## 2018-11-22 LAB — LUTEINIZING HORMONE: LH: 4.7 m[IU]/mL (ref 1.7–8.6)

## 2018-11-22 LAB — TESTOSTERONE: Testosterone: 159 ng/dL — ABNORMAL LOW (ref 264–916)

## 2018-11-29 DIAGNOSIS — M67911 Unspecified disorder of synovium and tendon, right shoulder: Secondary | ICD-10-CM | POA: Diagnosis not present

## 2018-11-29 DIAGNOSIS — M67912 Unspecified disorder of synovium and tendon, left shoulder: Secondary | ICD-10-CM | POA: Diagnosis not present

## 2019-02-17 ENCOUNTER — Other Ambulatory Visit: Payer: Self-pay

## 2019-02-17 ENCOUNTER — Ambulatory Visit (INDEPENDENT_AMBULATORY_CARE_PROVIDER_SITE_OTHER): Payer: Medicare HMO | Admitting: Family Medicine

## 2019-02-17 ENCOUNTER — Encounter: Payer: Self-pay | Admitting: Family Medicine

## 2019-02-17 VITALS — BP 131/83 | HR 69 | Temp 98.0°F | Resp 16 | Ht 66.93 in | Wt 198.0 lb

## 2019-02-17 DIAGNOSIS — R3912 Poor urinary stream: Secondary | ICD-10-CM

## 2019-02-17 DIAGNOSIS — E291 Testicular hypofunction: Secondary | ICD-10-CM | POA: Diagnosis not present

## 2019-02-17 DIAGNOSIS — Z23 Encounter for immunization: Secondary | ICD-10-CM

## 2019-02-17 DIAGNOSIS — R7303 Prediabetes: Secondary | ICD-10-CM | POA: Diagnosis not present

## 2019-02-17 DIAGNOSIS — M5441 Lumbago with sciatica, right side: Secondary | ICD-10-CM | POA: Diagnosis not present

## 2019-02-17 DIAGNOSIS — Z0001 Encounter for general adult medical examination with abnormal findings: Secondary | ICD-10-CM | POA: Diagnosis not present

## 2019-02-17 DIAGNOSIS — Z0000 Encounter for general adult medical examination without abnormal findings: Secondary | ICD-10-CM

## 2019-02-17 DIAGNOSIS — R7989 Other specified abnormal findings of blood chemistry: Secondary | ICD-10-CM | POA: Diagnosis not present

## 2019-02-17 DIAGNOSIS — N401 Enlarged prostate with lower urinary tract symptoms: Secondary | ICD-10-CM

## 2019-02-17 DIAGNOSIS — M5442 Lumbago with sciatica, left side: Secondary | ICD-10-CM

## 2019-02-17 DIAGNOSIS — E876 Hypokalemia: Secondary | ICD-10-CM

## 2019-02-17 DIAGNOSIS — E782 Mixed hyperlipidemia: Secondary | ICD-10-CM | POA: Diagnosis not present

## 2019-02-17 DIAGNOSIS — Z125 Encounter for screening for malignant neoplasm of prostate: Secondary | ICD-10-CM

## 2019-02-17 NOTE — Patient Instructions (Addendum)
   If you have lab work done today you will be contacted with your lab results within the next 2 weeks.  If you have not heard from us then please contact us. The fastest way to get your results is to register for My Chart.   IF you received an x-ray today, you will receive an invoice from [facility]. Please contact [facility] at [phone] with questions or concerns regarding your invoice.   IF you received labwork today, you will receive an invoice from [facility]. Please contact [facility] at [phone] with questions or concerns regarding your invoice.   Our billing staff will not be able to assist you with questions regarding bills from these companies.  You will be contacted with the lab results as soon as they are available. The fastest way to get your results is to activate your My Chart account. Instructions are located on the last page of this paperwork. If you have not heard from us regarding the results in 2 weeks, please contact this office.     Benign Prostatic Hyperplasia  Benign prostatic hyperplasia (BPH) is an enlarged prostate gland that is caused by the normal aging process and not by cancer. The prostate is a walnut-sized gland that is involved in the production of semen. It is located in front of the rectum and below the bladder. The bladder stores urine and the urethra is the tube that carries the urine out of the body. The prostate may get bigger as a man gets older. An enlarged prostate can press on the urethra. This can make it harder to pass urine. The build-up of urine in the bladder can cause infection. Back pressure and infection may progress to bladder damage and kidney (renal) failure. What are the causes? This condition is part of a normal aging process. However, not all men develop problems from this condition. If the prostate enlarges away from the urethra, urine flow will not be blocked. If it enlarges toward the urethra and compresses  it, there will be problems passing urine. What increases the risk? This condition is more likely to develop in men over the age of 50 years. What are the signs or symptoms? Symptoms of this condition include:  Getting up often during the night to urinate.  Needing to urinate frequently during the day.  Difficulty starting urine flow.  Decrease in size and strength of your urine stream.  Leaking (dribbling) after urinating.  Inability to pass urine. This needs immediate treatment.  Inability to completely empty your bladder.  Pain when you pass urine. This is more common if there is also an infection.  Urinary tract infection (UTI). How is this diagnosed? This condition is diagnosed based on your medical history, a physical exam, and your symptoms. Tests will also be done, such as:  A post-void bladder scan. This measures any amount of urine that may remain in your bladder after you finish urinating.  A digital rectal exam. In a rectal exam, your health care provider checks your prostate by putting a lubricated, gloved finger into your rectum to feel the back of your prostate gland. This exam detects the size of your gland and any abnormal lumps or growths.  An exam of your urine (urinalysis).  A prostate specific antigen (PSA) screening. This is a blood test used to screen for prostate cancer.  An ultrasound. This test uses sound waves to electronically produce a picture of your prostate gland. Your health care provider may refer you to   to a specialist in kidney and prostate diseases (urologist). How is this treated? Once symptoms begin, your health care provider will monitor your condition (active surveillance or watchful waiting). Treatment for this condition will depend on the severity of your condition. Treatment may include:  Observation and yearly exams. This may be the only treatment needed if your condition and symptoms are mild.  Medicines to relieve your symptoms,  including: ? Medicines to shrink the prostate. ? Medicines to relax the muscle of the prostate.  Surgery in severe cases. Surgery may include: ? Prostatectomy. In this procedure, the prostate tissue is removed completely through an open incision or with a laparascope or robotics. ? Transurethral resection of the prostate (TURP). In this procedure, a tool is inserted through the opening at the tip of the penis (urethra). It is used to cut away tissue of the inner core of the prostate. The pieces are removed through the same opening of the penis. This removes the blockage. ? Transurethral incision (TUIP). In this procedure, small cuts are made in the prostate. This lessens the prostate's pressure on the urethra. ? Transurethral microwave thermotherapy (TUMT). This procedure uses microwaves to create heat. The heat destroys and removes a small amount of prostate tissue. ? Transurethral needle ablation (TUNA). This procedure uses radio frequencies to destroy and remove a small amount of prostate tissue. ? Interstitial laser coagulation ([place]). This procedure uses a laser to destroy and remove a small amount of prostate tissue. ? Transurethral electrovaporization (TUVP). This procedure uses electrodes to destroy and remove a small amount of prostate tissue. ? Prostatic urethral lift. This procedure inserts an implant to push the lobes of the prostate away from the urethra. Follow these instructions at home:  Take over-the-counter and prescription medicines only as told by your health care provider.  Monitor your symptoms for any changes. Contact your health care provider with any changes.  Avoid drinking large amounts of liquid before going to bed or out in public.  Avoid or reduce how much caffeine or alcohol you drink.  Give yourself time when you urinate.  Keep all follow-up visits as told by your health care provider. This is important. Contact a health care provider if:  You have  unexplained back pain.  Your symptoms do not get better with treatment.  You develop side effects from the medicine you are taking.  Your urine becomes very dark or has a bad smell.  Your lower abdomen becomes distended and you have trouble passing your urine. Get help right away if:  You have a fever or chills.  You suddenly cannot urinate.  You feel lightheaded, or very dizzy, or you faint.  There are large amounts of blood or clots in the urine.  Your urinary problems become hard to manage.  You develop moderate to severe low back or flank pain. The flank is the side of your body between the ribs and the hip. These symptoms may represent a serious problem that is an emergency. Do not wait to see if the symptoms will go away. Get medical help right away. Call your local emergency services (911 in the [place] Do not drive yourself to the hospital. Summary  Benign prostatic hyperplasia (BPH) is an enlarged prostate that is caused by the normal aging process and not by cancer.  An enlarged prostate can press on the urethra. This can make it hard to pass urine.  This condition is part of a normal aging process and is more likely to develop  in men over the age of 28 years.  Get help right away if you suddenly cannot urinate. This information is not intended to replace advice given to you by your health care provider. Make sure you discuss any questions you have with your health care provider. Document Released: 12/04/2005 Document Revised: 01/08/2017 Document Reviewed: 01/08/2017 Elsevier Interactive Patient Education  2019 [employer].

## 2019-02-17 NOTE — Progress Notes (Signed)
Chief Complaint  Patient presents with  . Medicare Wellness    Subjective:  Bryce Rodriguez is a 68 y.o. male here for a health maintenance visit.  Patient is established pt  Pt reports some low back pain that is radiating down both legs He states that he felt it on the right then the left He denies groin numbness He has some pain radiating to the anterior thigh and stopping at the knee No difficulty climbing stairs  No incontinence of urine or stool  Patient Active Problem List   Diagnosis Date Noted  . Encounter for monitoring testosterone replacement therapy 11/21/2018  . Dyslipidemia 11/21/2018  . 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 Shoulder arthritis 11/21/2018  . Low testosterone 08/29/2018  . Mixed hyperlipidemia 06/12/2017  . Class 1 obesity due to excess calories without serious comorbidity with body mass index (BMI) of 32.0 to 32.9 in adult 06/12/2017  . Prediabetes 06/12/2017  . Environmental allergies 02/06/2017  . Numbness and tingling in both hands 05/01/2014  . Arthritis 02/24/2014  . HTN (hypertension) 01/23/2012  . Hypercholesteremia 01/23/2012    Past Medical History:  Diagnosis Date  . Arthritis 02/24/2014   arms, hands  . Class 1 obesity due to excess calories without serious comorbidity with body mass index (BMI) of 32.0 to 32.9 in adult 06/12/2017  . Environmental allergies 02/06/2017  . HLD (hyperlipidemia)   . HTN (hypertension)   . Hypercholesteremia 01/23/2012  . Mixed hyperlipidemia 06/12/2017  . Numbness and tingling in hands 05/01/2014   04/17/2014. [place].  Dorna Leitz, MD.  Radiographs: These do reveal obvious degenerative disc disease at C5-6. There is no instability noted. Plan: We will proceed with getting surgery set up at patient's earliest convenience.   . Prediabetes 06/12/2017    Past Surgical History:  Procedure Laterality Date  .  COLONOSCOPY    . NECK SURGERY  May 19 2014  . none       Outpatient Medications Prior to Visit  Medication Sig Dispense Refill  . amLODipine (NORVASC) 10 MG tablet Take 1 tablet (10 mg total) by mouth daily. 90 tablet 1  . aspirin 81 MG tablet Take 81 mg by mouth daily.    . cyclobenzaprine (FLEXERIL) 10 MG tablet Take 1 tablet (10 mg total) by mouth 3 (three) times daily as needed for muscle spasms. 30 tablet 1  . gabapentin (NEURONTIN) 300 MG capsule Take 1 capsule (300 mg total) by mouth at bedtime. For tingling and numbness 90 capsule 1  . losartan-hydrochlorothiazide (HYZAAR) 100-25 MG tablet Take 1 tablet by mouth daily.    . meloxicam (MOBIC) 15 MG tablet Take 1 tablet (15 mg total) by mouth daily. With food 90 tablet 3  . rosuvastatin (CRESTOR) 20 MG tablet TAKE 1 TABLET BY MOUTH EVERY DAY 90 tablet 1  . tadalafil (CIALIS) 5 MG tablet Take 1 tablet (5 mg total) by mouth daily as needed for erectile dysfunction. 10 tablet 11  . testosterone cypionate (DEPOTESTOSTERONE CYPIONATE) 200 MG/ML injection Inject 1 mL (200 mg total) into the muscle every 14 (fourteen) days. 2 mL 5  . losartan-hydrochlorothiazide (HYZAAR) 100-25 MG tablet Take 1 tablet by mouth daily. 90 tablet 1   Facility-Administered Medications Prior to Visit  Medication Dose Route Frequency Provider Last Rate Last Dose  . testosterone cypionate (DEPOTESTOSTERONE CYPIONATE) injection 200 mg  200 mg Intramuscular Q14 Days Evoleth Nordmeyer A, MD   200 mg at 09/30/18 1050  No Known Allergies   Family History  Problem Relation Age of Onset  . Stomach cancer Father   . Cancer Other   . Hypertension Other   . Colon cancer Neg Hx   . Esophageal cancer Neg Hx   . Rectal cancer Neg Hx      Health Habits: Dental Exam: up to date Eye Exam: up to date Exercise: 0 times/week on average Diet:   Social History   Socioeconomic History  . Marital status: Married    Spouse name: Not on file  . Number of children: 2    . Years of education: Not on file  . Highest education level: Not on file  Occupational History  . Occupation: MANAGEMENT    Employer: [employer]  Social Needs  . Financial resource strain: Not on file  . Food insecurity:    Worry: Not on file    Inability: Not on file  . Transportation needs:    Medical: Not on file    Non-medical: Not on file  Tobacco Use  . Smoking status: Never Smoker  . Smokeless tobacco: Current User    Types: Snuff  Substance and Sexual Activity  . Alcohol use: Yes    Alcohol/week: 12.0 standard drinks    Types: 6 Cans of beer, 4 Shots of liquor, 2 Standard drinks or equivalent per week  . Drug use: No  . Sexual activity: Not on file  Lifestyle  . Physical activity:    Days per week: Not on file    Minutes per session: Not on file  . Stress: Not on file  Relationships  . Social connections:    Talks on phone: Not on file    Gets together: Not on file    Attends religious service: Not on file    Active member of club or organization: Not on file    Attends meetings of clubs or organizations: Not on file    Relationship status: Not on file  . Intimate partner violence:    Fear of current or ex partner: Not on file    Emotionally abused: Not on file    Physically abused: Not on file    Forced sexual activity: Not on file  Other Topics Concern  . Not on file  Social History Narrative  . Not on file   Social History   Substance and Sexual Activity  Alcohol Use Yes  . Alcohol/week: 12.0 standard drinks  . Types: 6 Cans of beer, 4 Shots of liquor, 2 Standard drinks or equivalent per week   Social History   Tobacco Use  Smoking Status Never Smoker  Smokeless Tobacco Current User  . Types: Snuff   Social History   Substance and Sexual Activity  Drug Use No     Health Maintenance: See under health Maintenance activity for review of completion dates as well. Immunization History  Administered Date(s) Administered  .  Influenza Split 09/18/2012  . Influenza, High Dose Seasonal PF 11/21/2018  . Influenza-Unspecified 09/18/2012, 10/24/2014, 08/18/2017  . Pneumococcal Conjugate-13 01/31/2018  . Pneumococcal Polysaccharide-23 02/17/2019      Depression Screen-PHQ2/9 Depression screen [facility] 2/9 11/21/2018 08/29/2018 08/12/2018 05/06/2018 03/14/2018  Decreased Interest 0 0 0 0 0  Down, Depressed, Hopeless 0 0 0 0 0  PHQ - 2 Score 0 0 0 0 0       Depression Severity and Treatment Recommendations:  0-4= None  5-9= Mild / Treatment: Support, educate to call if worse; return in one month  10-14= Moderate / Treatment: Support, watchful waiting; Antidepressant or Psycotherapy  15-19= Moderately severe / Treatment: Antidepressant OR Psychotherapy  >= 20 = Major depression, severe / Antidepressant AND Psychotherapy    Review of Systems   ROS  See HPI for ROS as well.  Review of Systems  Constitutional: Negative for activity change, appetite change, chills and fever.  HENT: Negative for congestion, nosebleeds, trouble swallowing and voice change.   Respiratory: Negative for cough, shortness of breath and wheezing.   Gastrointestinal: Negative for diarrhea, nausea and vomiting.  Genitourinary: Negative for difficulty urinating, dysuria, flank pain and hematuria. Some splitting of the urine stream and some slower flow rates of his urine Musculoskeletal:see hpi  Neurological: Negative for dizziness, speech difficulty, light-headedness and numbness.  See HPI. All other review of systems negative.    Objective:   Vitals:   02/17/19 0815  BP: 131/83  Pulse: 69  Resp: 16  Temp: 98 F (36.7 C)  TempSrc: Oral  SpO2: 96%  Weight: 198 lb (89.8 kg)  Height: 5' 6.93" (1.7 m)    Body mass index is 31.08 kg/m.  Physical Exam  BP 131/83   Pulse 69   Temp 98 F (36.7 C) (Oral)   Resp 16   Ht 5' 6.93" (1.7 m)   Wt 198 lb (89.8 kg)   SpO2 96%   BMI 31.08 kg/m   General Appearance:    Alert,  cooperative, no distress, appears stated age  Head:    Normocephalic, without obvious abnormality, atraumatic  Eyes:    PERRL, conjunctiva/corneas clear, EOM's intact, fundi    benign, both eyes       Ears:    Normal TM's and external ear canals, both ears  Nose:   Nares normal, septum midline, mucosa normal, no drainage   or sinus tenderness  Throat:   Lips, mucosa, and tongue normal; teeth and gums normal  Neck:   Supple, symmetrical, trachea midline, no adenopathy;       thyroid:  No enlargement/tenderness/nodules; no carotid   bruit or JVD  Back:     Symmetric, no curvature, ROM normal, no CVA tenderness  Lungs:     Clear to auscultation bilaterally, respirations unlabored  Chest wall:    No tenderness or deformity  Heart:    Regular rate and rhythm, S1 and S2 normal, no murmur, rub   or gallop  Abdomen:     Soft, non-tender, bowel sounds active all four quadrants,    no masses, no organomegaly  Genitalia:    Normal male without lesion, discharge or tenderness  Rectal:    Normal tone, enlarged prostate, no masses or tenderness;   guaiac negative stool  Extremities:   Extremities normal, atraumatic, no cyanosis or edema  Pulses:   2+ and symmetric all extremities  Skin:   Skin color, texture, turgor normal, no rashes or lesions  Lymph nodes:   Cervical, supraclavicular, and axillary nodes normal  Neurologic:   CNII-XII intact. Normal strength, sensation and reflexes      throughout      Assessment/Plan:   Patient was seen for a health maintenance exam.  Counseled the patient on health maintenance issues. Reviewed her health mainteance schedule and ordered appropriate tests (see orders.) Counseled on regular exercise and weight management. Recommend regular eye exams and dental cleaning.   The following issues were addressed today for health maintenance:   [place] was seen today for medicare wellness.  Diagnoses and all orders for this visit:  Medicare  annual wellness visit,  initial-reviewed health maintenance based on age and gender  Low testosterone  Mixed hyperlipidemia -     Hemoglobin A1c -     Lipid panel -     CBC  Prediabetes-we will continue to monitor with hemoglobin A1c -     CBC  Testosterone deficiency in male-currently being supplemented with injectable testosterone We will continue to monitor -     Comprehensive metabolic panel -     TestT+TestF+SHBG -     Lipid panel -     CBC  Need for vaccination -     Pneumococcal polysaccharide vaccine 23-valent greater than or equal to 2yo subcutaneous/IM  Screening for malignant neoplasm of prostate -     PSA  Benign prostatic hyperplasia with weak urinary stream-discussed that we will check PSA and if he has lower urinary tract symptoms persist or increase will plan on referral to urology   Low back pain with sciatica Referral placed for physical therapy Patient has already established with orthopedics and advised that if physical therapy is not effective to see orthopedics  No follow-ups on file.    Body mass index is 31.08 kg/m.:  Discussed the patient's BMI with patient. The BMI body mass index is 31.08 kg/m.     Future Appointments  Date Time Provider [place]  05/22/2019  8:00 AM Forrest Moron, MD PCP-PCP [facility]    Patient Instructions       If you have lab work done today you will be contacted with your lab results within the next 2 weeks.  If you have not heard from [place] then please contact us. The fastest way to get your results is to register for My Chart.   IF you received an x-ray today, you will receive an invoice from [facility]. Please contact [facility] at [phone] with questions or concerns regarding your invoice.   IF you received labwork today, you will receive an invoice from [facility]. Please contact [facility] at [phone] with questions or concerns regarding your invoice.   Our billing staff will not be able to assist you  with questions regarding bills from these companies.  You will be contacted with the lab results as soon as they are available. The fastest way to get your results is to activate your My Chart account. Instructions are located on the last page of this paperwork. If you have not heard from [place] regarding the results in 2 weeks, please contact this office.      Benign Prostatic Hyperplasia  Benign prostatic hyperplasia (BPH) is an enlarged prostate gland that is caused by the normal aging process and not by cancer. The prostate is a walnut-sized gland that is involved in the production of semen. It is located in front of the rectum and below the bladder. The bladder stores urine and the urethra is the tube that carries the urine out of the body. The prostate may get bigger as a man gets older. An enlarged prostate can press on the urethra. This can make it harder to pass urine. The build-up of urine in the bladder can cause infection. Back pressure and infection may progress to bladder damage and kidney (renal) failure. What are the causes? This condition is part of a normal aging process. However, not all men develop problems from this condition. If the prostate enlarges away from the urethra, urine flow will not be blocked. If it enlarges toward the urethra and compresses it, there will be problems passing  urine. What increases the risk? This condition is more likely to develop in men over the age of 89 years. What are the signs or symptoms? Symptoms of this condition include:  Getting up often during the night to urinate.  Needing to urinate frequently during the day.  Difficulty starting urine flow.  Decrease in size and strength of your urine stream.  Leaking (dribbling) after urinating.  Inability to pass urine. This needs immediate treatment.  Inability to completely empty your bladder.  Pain when you pass urine. This is more common if there is also an infection.  Urinary tract  infection (UTI). How is this diagnosed? This condition is diagnosed based on your medical history, a physical exam, and your symptoms. Tests will also be done, such as:  A post-void bladder scan. This measures any amount of urine that may remain in your bladder after you finish urinating.  A digital rectal exam. In a rectal exam, your health care provider checks your prostate by putting a lubricated, gloved finger into your rectum to feel the back of your prostate gland. This exam detects the size of your gland and any abnormal lumps or growths.  An exam of your urine (urinalysis).  A prostate specific antigen (PSA) screening. This is a blood test used to screen for prostate cancer.  An ultrasound. This test uses sound waves to electronically produce a picture of your prostate gland. Your health care provider may refer you to a specialist in kidney and prostate diseases (urologist). How is this treated? Once symptoms begin, your health care provider will monitor your condition (active surveillance or watchful waiting). Treatment for this condition will depend on the severity of your condition. Treatment may include:  Observation and yearly exams. This may be the only treatment needed if your condition and symptoms are mild.  Medicines to relieve your symptoms, including: ? Medicines to shrink the prostate. ? Medicines to relax the muscle of the prostate.  Surgery in severe cases. Surgery may include: ? Prostatectomy. In this procedure, the prostate tissue is removed completely through an open incision or with a laparascope or robotics. ? Transurethral resection of the prostate (TURP). In this procedure, a tool is inserted through the opening at the tip of the penis (urethra). It is used to cut away tissue of the inner core of the prostate. The pieces are removed through the same opening of the penis. This removes the blockage. ? Transurethral incision (TUIP). In this procedure, small cuts  are made in the prostate. This lessens the prostate's pressure on the urethra. ? Transurethral microwave thermotherapy (TUMT). This procedure uses microwaves to create heat. The heat destroys and removes a small amount of prostate tissue. ? Transurethral needle ablation (TUNA). This procedure uses radio frequencies to destroy and remove a small amount of prostate tissue. ? Interstitial laser coagulation ([place]). This procedure uses a laser to destroy and remove a small amount of prostate tissue. ? Transurethral electrovaporization (TUVP). This procedure uses electrodes to destroy and remove a small amount of prostate tissue. ? Prostatic urethral lift. This procedure inserts an implant to push the lobes of the prostate away from the urethra. Follow these instructions at home:  Take over-the-counter and prescription medicines only as told by your health care provider.  Monitor your symptoms for any changes. Contact your health care provider with any changes.  Avoid drinking large amounts of liquid before going to bed or out in public.  Avoid or reduce how much caffeine or alcohol you drink.  Give yourself time when you urinate.  Keep all follow-up visits as told by your health care provider. This is important. Contact a health care provider if:  You have unexplained back pain.  Your symptoms do not get better with treatment.  You develop side effects from the medicine you are taking.  Your urine becomes very dark or has a bad smell.  Your lower abdomen becomes distended and you have trouble passing your urine. Get help right away if:  You have a fever or chills.  You suddenly cannot urinate.  You feel lightheaded, or very dizzy, or you faint.  There are large amounts of blood or clots in the urine.  Your urinary problems become hard to manage.  You develop moderate to severe low back or flank pain. The flank is the side of your body between the ribs and the hip. These symptoms  may represent a serious problem that is an emergency. Do not wait to see if the symptoms will go away. Get medical help right away. Call your local emergency services (911 in the [place] Do not drive yourself to the hospital. Summary  Benign prostatic hyperplasia (BPH) is an enlarged prostate that is caused by the normal aging process and not by cancer.  An enlarged prostate can press on the urethra. This can make it hard to pass urine.  This condition is part of a normal aging process and is more likely to develop in men over the age of 67 years.  Get help right away if you suddenly cannot urinate. This information is not intended to replace advice given to you by your health care provider. Make sure you discuss any questions you have with your health care provider. Document Released: 12/04/2005 Document Revised: 01/08/2017 Document Reviewed: 01/08/2017 Elsevier Interactive Patient Education  2019 [employer].

## 2019-02-18 LAB — COMPREHENSIVE METABOLIC PANEL
ALT: 45 IU/L — ABNORMAL HIGH (ref 0–44)
AST: 26 IU/L (ref 0–40)
Albumin/Globulin Ratio: 2.5 — ABNORMAL HIGH (ref 1.2–2.2)
Albumin: 4.7 g/dL (ref 3.8–4.8)
Alkaline Phosphatase: 51 IU/L (ref 39–117)
BUN/Creatinine Ratio: 11 (ref 10–24)
BUN: 10 mg/dL (ref 8–27)
Bilirubin Total: 0.5 mg/dL (ref 0.0–1.2)
CO2: 26 mmol/L (ref 20–29)
Calcium: 9.5 mg/dL (ref 8.6–10.2)
Chloride: 100 mmol/L (ref 96–106)
Creatinine, Ser: 0.87 mg/dL (ref 0.76–1.27)
GFR calc Af Amer: 104 mL/min/{1.73_m2} (ref >59)
GFR calc non Af Amer: 90 mL/min/{1.73_m2} (ref >59)
GLUCOSE: 107 mg/dL — AB (ref 65–99)
Globulin, Total: 1.9 g/dL (ref 1.5–4.5)
Potassium: 3 mmol/L — ABNORMAL LOW (ref 3.5–5.2)
Sodium: 141 mmol/L (ref 134–144)
Total Protein: 6.6 g/dL (ref 6.0–8.5)

## 2019-02-18 LAB — CBC
Hematocrit: 39.9 % (ref 37.5–51.0)
Hemoglobin: 13.8 g/dL (ref 13.0–17.7)
MCH: 29.2 pg (ref 26.6–33.0)
MCHC: 34.6 g/dL (ref 31.5–35.7)
MCV: 84 fL (ref 79–97)
Platelets: 233 10*3/uL (ref 150–450)
RBC: 4.73 x10E6/uL (ref 4.14–5.80)
RDW: 14.6 % (ref 11.6–15.4)
WBC: 5.6 10*3/uL (ref 3.4–10.8)

## 2019-02-18 LAB — LIPID PANEL
Chol/HDL Ratio: 3.5 ratio (ref 0.0–5.0)
Cholesterol, Total: 145 mg/dL (ref 100–199)
HDL: 42 mg/dL (ref >39)
LDL Calculated: 78 mg/dL (ref 0–99)
Triglycerides: 124 mg/dL (ref 0–149)
VLDL Cholesterol Cal: 25 mg/dL (ref 5–40)

## 2019-02-18 LAB — TESTT+TESTF+SHBG
Sex Hormone Binding: 23.1 nmol/L (ref 19.3–76.4)
TESTOSTERONE, TOTAL, LC/MS: 178.8 ng/dL — AB (ref 264.0–916.0)
Testosterone, Free: 9.8 pg/mL (ref 6.6–18.1)

## 2019-02-18 LAB — PSA: Prostate Specific Ag, Serum: 1.9 ng/mL (ref 0.0–4.0)

## 2019-02-18 LAB — HEMOGLOBIN A1C
ESTIMATED AVERAGE GLUCOSE: 134 mg/dL
Hgb A1c MFr Bld: 6.3 % — ABNORMAL HIGH (ref 4.8–5.6)

## 2019-02-25 ENCOUNTER — Telehealth: Payer: Self-pay | Admitting: Family Medicine

## 2019-02-25 NOTE — Telephone Encounter (Signed)
Copied from [place] [id number]. Topic: General - Other >> Feb 25, 2019  3:51 PM Keene Breath wrote: Reason for CRM: Patient called to get the results of his physical, and blood tests.  Please call patient with the results.  CB# [phone]

## 2019-02-25 NOTE — Addendum Note (Signed)
Addended by: Delia Chimes A on: 02/25/2019 05:53 PM   Modules accepted: Orders

## 2019-02-26 NOTE — Telephone Encounter (Signed)
Spoke of pt an advised of lab results and pt agreeable and will come in in a month to have labs drawn. Dgaddy, CMA

## 2019-02-26 NOTE — Telephone Encounter (Signed)
Called and LVM to call office back about labs.  

## 2019-03-05 ENCOUNTER — Ambulatory Visit: Payer: Medicare HMO

## 2019-03-18 ENCOUNTER — Ambulatory Visit: Payer: Medicare HMO

## 2019-03-24 NOTE — Telephone Encounter (Signed)
Please set the patient up for a lab appointment for his testosterone check any time this month.

## 2019-03-24 NOTE — Telephone Encounter (Signed)
-----   Message from Bryce Rodriguez sent at 02/26/2019  2:01 PM EDT ----- Gave patient results as noted, patient wants to know if labs already order or how does he go about getting labs done.

## 2019-04-18 ENCOUNTER — Ambulatory Visit: Payer: Medicare HMO | Admitting: Family Medicine

## 2019-05-22 ENCOUNTER — Ambulatory Visit: Payer: Medicare HMO | Admitting: Family Medicine

## 2019-06-02 ENCOUNTER — Other Ambulatory Visit: Payer: Self-pay | Admitting: Family Medicine

## 2019-06-02 DIAGNOSIS — R202 Paresthesia of skin: Secondary | ICD-10-CM

## 2019-06-02 DIAGNOSIS — I10 Essential (primary) hypertension: Secondary | ICD-10-CM

## 2019-06-02 DIAGNOSIS — R200 Anesthesia of skin: Secondary | ICD-10-CM

## 2019-06-17 ENCOUNTER — Other Ambulatory Visit: Payer: Self-pay

## 2019-06-17 ENCOUNTER — Ambulatory Visit (INDEPENDENT_AMBULATORY_CARE_PROVIDER_SITE_OTHER): Payer: Medicare HMO | Admitting: Family Medicine

## 2019-06-17 ENCOUNTER — Encounter: Payer: Self-pay | Admitting: Family Medicine

## 2019-06-17 VITALS — BP 137/79 | HR 69 | Temp 98.7°F | Ht 65.5 in | Wt 195.4 lb

## 2019-06-17 DIAGNOSIS — L819 Disorder of pigmentation, unspecified: Secondary | ICD-10-CM | POA: Diagnosis not present

## 2019-06-17 DIAGNOSIS — W57XXXA Bitten or stung by nonvenomous insect and other nonvenomous arthropods, initial encounter: Secondary | ICD-10-CM | POA: Diagnosis not present

## 2019-06-17 DIAGNOSIS — T63301A Toxic effect of unspecified spider venom, accidental (unintentional), initial encounter: Secondary | ICD-10-CM | POA: Insufficient documentation

## 2019-06-17 DIAGNOSIS — T63304A Toxic effect of unspecified spider venom, undetermined, initial encounter: Secondary | ICD-10-CM

## 2019-06-17 DIAGNOSIS — S40862A Insect bite (nonvenomous) of left upper arm, initial encounter: Secondary | ICD-10-CM | POA: Insufficient documentation

## 2019-06-17 NOTE — Progress Notes (Signed)
Acute Office Visit  Subjective:    Patient ID: Bryce Rodriguez, male    DOB: 12-Aug-1951, 68 y.o.   MRN: [id number]  Chief Complaint  Patient presents with  . Tick Removal    1 week ago sunday  . Insect Bite    HPI Patient is in today for spider bite-left hand-raised and slightly red-pain at first -improving-noted 7/21 Upper arm tick bite-pt completely removed and was not on the skin for more than a few hours-6/21-red ring noted around the area -now resolved Left neck line-insect bite-itchy Hyperpigmented lesion-raised-noted after a spider bite 1 year ago-irregular lesion , raised Pt works outside in [employer] does not wear gloves, long sleeves or insect spray  Past Medical History:  Diagnosis Date  . Arthritis 02/24/2014   arms, hands  . Class 1 obesity due to excess calories without serious comorbidity with body mass index (BMI) of 32.0 to 32.9 in adult 06/12/2017  . Environmental allergies 02/06/2017  . HLD (hyperlipidemia)   . HTN (hypertension)   . Hypercholesteremia 01/23/2012  . Mixed hyperlipidemia 06/12/2017  . Numbness and tingling in hands 05/01/2014   04/17/2014. [place].  Dorna Leitz, MD.  Radiographs: These do reveal obvious degenerative disc disease at C5-6. There is no instability noted. Plan: We will proceed with getting surgery set up at patient's earliest convenience.   . Prediabetes 06/12/2017    Past Surgical History:  Procedure Laterality Date  . COLONOSCOPY    . NECK SURGERY  May 19 2014  . none      Family History  Problem Relation Age of Onset  . Stomach cancer Father   . Cancer Other   . Hypertension Other   . Colon cancer Neg Hx   . Esophageal cancer Neg Hx   . Rectal cancer Neg Hx     Social History   Socioeconomic History  . Marital status: Married    Spouse name: Not on file  . Number of children: 2  . Years of education: Not on file  . Highest education level: Not on file  Occupational History   . Occupation: MANAGEMENT    Employer: [employer]  Social Needs  . Financial resource strain: Not on file  . Food insecurity    Worry: Not on file    Inability: Not on file  . Transportation needs    Medical: Not on file    Non-medical: Not on file  Tobacco Use  . Smoking status: Never Smoker  . Smokeless tobacco: Current User    Types: Snuff  Substance and Sexual Activity  . Alcohol use: Yes    Alcohol/week: 12.0 standard drinks    Types: 6 Cans of beer, 4 Shots of liquor, 2 Standard drinks or equivalent per week  . Drug use: No  . Sexual activity: Not on file  Lifestyle  . Physical activity    Days per week: Not on file    Minutes per session: Not on file  . Stress: Not on file  Relationships  . Social Herbalist on phone: Not on file    Gets together: Not on file    Attends religious service: Not on file    Active member of club or organization: Not on file    Attends meetings of clubs or organizations: Not on file    Relationship status: Not on file  . Intimate partner violence    Fear of current or ex partner: Not on file  Emotionally abused: Not on file    Physically abused: Not on file    Forced sexual activity: Not on file  Other Topics Concern  . Not on file  Social History Narrative  . Not on file    Outpatient Medications Prior to Visit  Medication Sig Dispense Refill  . amLODipine (NORVASC) 10 MG tablet TAKE 1 TABLET BY MOUTH EVERY DAY 90 tablet 1  . aspirin 81 MG tablet Take 81 mg by mouth daily.    . cyclobenzaprine (FLEXERIL) 10 MG tablet Take 1 tablet (10 mg total) by mouth 3 (three) times daily as needed for muscle spasms. 30 tablet 1  . gabapentin (NEURONTIN) 300 MG capsule TAKE 1 CAPSULE BY MOUTH AT BEDTIME. FOR TINGLING AND NUMBNESS 90 capsule 1  . losartan-hydrochlorothiazide (HYZAAR) 100-25 MG tablet Take 1 tablet by mouth daily.    . meloxicam (MOBIC) 15 MG tablet Take 1 tablet (15 mg total) by mouth daily. With food 90  tablet 3  . rosuvastatin (CRESTOR) 20 MG tablet TAKE 1 TABLET BY MOUTH EVERY DAY 90 tablet 1  . tadalafil (CIALIS) 5 MG tablet Take 1 tablet (5 mg total) by mouth daily as needed for erectile dysfunction. 10 tablet 11  . testosterone cypionate (DEPOTESTOSTERONE CYPIONATE) 200 MG/ML injection Inject 1 mL (200 mg total) into the muscle every 14 (fourteen) days. 2 mL 5   Facility-Administered Medications Prior to Visit  Medication Dose Route Frequency Provider Last Rate Last Dose  . testosterone cypionate (DEPOTESTOSTERONE CYPIONATE) injection 200 mg  200 mg Intramuscular Q14 Days Stallings, Zoe A, MD   200 mg at 09/30/18 1050    No Known Allergies  Review of Systems  Skin: Positive for itching.  raised pigmented lesion      Objective:    Physical Exam  Constitutional: He appears well-developed and well-nourished. No distress.  skin-left neck -eryth raised macule Left hand-MP joint raised papules  Right forearm-hyperpigmented lesion 7mm, irregular border, raised Left upper arm-eryth papule BP 137/79 (BP Location: Right Arm, Patient Position: Sitting, Cuff Size: Normal)   Pulse 69   Temp 98.7 F (37.1 C) (Oral)   Ht 5' 5.5" (1.664 m)   Wt 195 lb 6.4 oz (88.6 kg)   SpO2 95%   BMI 32.02 kg/m  Wt Readings from Last 3 Encounters:  06/17/19 195 lb 6.4 oz (88.6 kg)  02/17/19 198 lb (89.8 kg)  11/21/18 205 lb (93 kg)    Lab Results  Component Value Date   WBC 5.6 02/17/2019   HGB 13.8 02/17/2019   HCT 39.9 02/17/2019   MCV 84 02/17/2019   PLT 233 02/17/2019   Lab Results  Component Value Date   NA 141 02/17/2019   K 3.0 (L) 02/17/2019   CO2 26 02/17/2019   GLUCOSE 107 (H) 02/17/2019   BUN 10 02/17/2019   CREATININE 0.87 02/17/2019   BILITOT 0.5 02/17/2019   ALKPHOS 51 02/17/2019   AST 26 02/17/2019   ALT 45 (H) 02/17/2019   PROT 6.6 02/17/2019   ALBUMIN 4.7 02/17/2019   CALCIUM 9.5 02/17/2019   Lab Results  Component Value Date   CHOL 145 02/17/2019   Lab  Results  Component Value Date   HDL 42 02/17/2019   Lab Results  Component Value Date   LDLCALC 78 02/17/2019   Lab Results  Component Value Date   TRIG 124 02/17/2019   Lab Results  Component Value Date   CHOLHDL 3.5 02/17/2019   Lab Results  Component Value Date  HGBA1C 6.3 (H) 02/17/2019       Assessment & Plan:   Problem List Items Addressed This Visit    None     1. Atypical pigmented skin lesion Right forearm-raised, hyperpigmented with rough edge-thought related to previous bite with did not heal correctly - Ambulatory referral to Dermatology   2. Tick bite of left upper arm, initial encounter Healing well-less than 6 hours on skin  3. Spider bite wound, undetermined intent, initial encounter D/w pt skin care and bandage-works in lawncare-encouraged to wear gloves and bandage until healed  Uziel Covault Hannah Beat, MD

## 2019-06-17 NOTE — Patient Instructions (Addendum)
     If you have lab work done today you will be contacted with your lab results within the next 2 weeks.  If you have not heard from [place] then please contact us. The fastest way to get your results is to register for My Chart.   IF you received an x-ray today, you will receive an invoice from [facility]. Please contact [facility] at [phone] with questions or concerns regarding your invoice.   IF you received labwork today, you will receive an invoice from [place]. Please contact [facility] at [phone] with questions or concerns regarding your invoice.   Our billing staff will not be able to assist you with questions regarding bills from these companies.  You will be contacted with the lab results as soon as they are available. The fastest way to get your results is to activate your My Chart account. Instructions are located on the last page of this paperwork. If you have not heard from [place] regarding the results in 2 weeks, please contact this office.    Hyperpigmented lesion-dermatology referral Bandage to the hand to prevent infection Anti-itch ointment to bite at neckline

## 2019-06-23 ENCOUNTER — Other Ambulatory Visit: Payer: Self-pay

## 2019-06-23 ENCOUNTER — Encounter: Payer: Self-pay | Admitting: Family Medicine

## 2019-06-23 ENCOUNTER — Ambulatory Visit (INDEPENDENT_AMBULATORY_CARE_PROVIDER_SITE_OTHER): Payer: Medicare HMO | Admitting: Family Medicine

## 2019-06-23 VITALS — BP 131/82 | HR 60 | Temp 98.6°F | Resp 18 | Ht 65.5 in | Wt 195.4 lb

## 2019-06-23 DIAGNOSIS — N138 Other obstructive and reflux uropathy: Secondary | ICD-10-CM

## 2019-06-23 DIAGNOSIS — R310 Gross hematuria: Secondary | ICD-10-CM | POA: Diagnosis not present

## 2019-06-23 DIAGNOSIS — R7989 Other specified abnormal findings of blood chemistry: Secondary | ICD-10-CM

## 2019-06-23 DIAGNOSIS — N401 Enlarged prostate with lower urinary tract symptoms: Secondary | ICD-10-CM | POA: Diagnosis not present

## 2019-06-23 LAB — POCT URINALYSIS DIP (MANUAL ENTRY)
Bilirubin, UA: NEGATIVE
Glucose, UA: NEGATIVE mg/dL
Ketones, POC UA: NEGATIVE mg/dL
Leukocytes, UA: NEGATIVE
Nitrite, UA: NEGATIVE
Protein Ur, POC: NEGATIVE mg/dL
Spec Grav, UA: 1.02 (ref 1.010–1.025)
Urobilinogen, UA: 0.2 E.U./dL
pH, UA: 5 (ref 5.0–8.0)

## 2019-06-23 NOTE — Progress Notes (Signed)
Established Patient Office Visit  Subjective:  Patient ID: Bryce Rodriguez, male    DOB: [birth year]  Age: 67 y.o. MRN: [id number]  CC:  Chief Complaint  Patient presents with  . prostate    states he is having issues with his prostate again; sometimes he has issues with urinating  . Medication Refill    Testosterone 200mg /ml    HPI Bryce Rodriguez presents for   Pt reports that he has been having low back pain, bladder fullness, split urine stream, urinary urgency, urinary incontinence, he also feels like he has pressure where he sits in the groin at times He states that after he has a bowel movement he has to urinate No blood in the urine currently but a week ago he noticed blood in the urine He has only had 3 rounds of testosterone supplementation - last dose October 2019 He has a daily BM     Past Medical History:  Diagnosis Date  . Arthritis 02/24/2014   arms, hands  . Class 1 obesity due to excess calories without serious comorbidity with body mass index (BMI) of 32.0 to 32.9 in adult 06/12/2017  . Environmental allergies 02/06/2017  . HLD (hyperlipidemia)   . HTN (hypertension)   . Hypercholesteremia 01/23/2012  . Mixed hyperlipidemia 06/12/2017  . Numbness and tingling in hands 05/01/2014   04/17/2014. [place].  Dorna Leitz, MD.  Radiographs: These do reveal obvious degenerative disc disease at C5-6. There is no instability noted. Plan: We will proceed with getting surgery set up at patient's earliest convenience.   . Prediabetes 06/12/2017    Past Surgical History:  Procedure Laterality Date  . COLONOSCOPY    . NECK SURGERY  May 19 2014  . none      Family History  Problem Relation Age of Onset  . Stomach cancer Father   . Cancer Other   . Hypertension Other   . Colon cancer Neg Hx   . Esophageal cancer Neg Hx   . Rectal cancer Neg Hx     Social History   Socioeconomic History  . Marital status: Married    Spouse  name: Not on file  . Number of children: 2  . Years of education: Not on file  . Highest education level: Not on file  Occupational History  . Occupation: MANAGEMENT    Employer: [employer]  Social Needs  . Financial resource strain: Not on file  . Food insecurity    Worry: Not on file    Inability: Not on file  . Transportation needs    Medical: Not on file    Non-medical: Not on file  Tobacco Use  . Smoking status: Never Smoker  . Smokeless tobacco: Current User    Types: Snuff  Substance and Sexual Activity  . Alcohol use: Yes    Alcohol/week: 12.0 standard drinks    Types: 6 Cans of beer, 4 Shots of liquor, 2 Standard drinks or equivalent per week  . Drug use: No  . Sexual activity: Not on file  Lifestyle  . Physical activity    Days per week: Not on file    Minutes per session: Not on file  . Stress: Not on file  Relationships  . Social Herbalist on phone: Not on file    Gets together: Not on file    Attends religious service: Not on file    Active member of club or organization: Not on  file    Attends meetings of clubs or organizations: Not on file    Relationship status: Not on file  . Intimate partner violence    Fear of current or ex partner: Not on file    Emotionally abused: Not on file    Physically abused: Not on file    Forced sexual activity: Not on file  Other Topics Concern  . Not on file  Social History Narrative  . Not on file    Outpatient Medications Prior to Visit  Medication Sig Dispense Refill  . amLODipine (NORVASC) 10 MG tablet TAKE 1 TABLET BY MOUTH EVERY DAY 90 tablet 1  . aspirin 81 MG tablet Take 81 mg by mouth daily.    . cyclobenzaprine (FLEXERIL) 10 MG tablet Take 1 tablet (10 mg total) by mouth 3 (three) times daily as needed for muscle spasms. 30 tablet 1  . gabapentin (NEURONTIN) 300 MG capsule TAKE 1 CAPSULE BY MOUTH AT BEDTIME. FOR TINGLING AND NUMBNESS 90 capsule 1  . meloxicam (MOBIC) 15 MG tablet Take  1 tablet (15 mg total) by mouth daily. With food 90 tablet 3  . tadalafil (CIALIS) 5 MG tablet Take 1 tablet (5 mg total) by mouth daily as needed for erectile dysfunction. 10 tablet 11  . testosterone cypionate (DEPOTESTOSTERONE CYPIONATE) 200 MG/ML injection Inject 1 mL (200 mg total) into the muscle every 14 (fourteen) days. 2 mL 5  . losartan-hydrochlorothiazide (HYZAAR) 100-25 MG tablet Take 1 tablet by mouth daily.    . rosuvastatin (CRESTOR) 20 MG tablet TAKE 1 TABLET BY MOUTH EVERY DAY 90 tablet 1   Facility-Administered Medications Prior to Visit  Medication Dose Route Frequency Provider Last Rate Last Dose  . testosterone cypionate (DEPOTESTOSTERONE CYPIONATE) injection 200 mg  200 mg Intramuscular Q14 Days Nolon Rod, Aldrin Engelhard A, MD   200 mg at 09/30/18 1050    No Known Allergies  ROS Review of Systems    Objective:    Physical Exam  BP 131/82 (BP Location: Right Arm, Patient Position: Sitting, Cuff Size: Large)   Pulse 60   Temp 98.6 F (37 C) (Oral)   Resp 18   Ht 5' 5.5" (1.664 m)   Wt 195 lb 6.4 oz (88.6 kg)   SpO2 99%   BMI 32.02 kg/m     Wt Readings from Last 3 Encounters:  06/23/19 195 lb 6.4 oz (88.6 kg)  06/17/19 195 lb 6.4 oz (88.6 kg)  02/17/19 198 lb (89.8 kg)    Physical Exam  Constitutional: Oriented to person, place, and time. Appears well-developed and well-nourished.  HENT:  Head: Normocephalic and atraumatic.  Eyes: Conjunctivae and EOM are normal.  Cardiovascular: Normal rate, regular rhythm, normal heart sounds and intact distal pulses.  No murmur heard. Pulmonary/Chest: Effort normal and breath sounds normal. No stridor. No respiratory distress. Has no wheezes.  Neurological: Is alert and oriented to person, place, and time.  Skin: Skin is warm. Capillary refill takes less than 2 seconds.  Psychiatric: Has a normal mood and affect. Behavior is normal. Judgment and thought content normal.   DRE with chaperone Solid, firm, enlarged,  nontender prostate No gross blood Normal rectal tone  Inguinal canal without masses of LAD Scrotum without tenderness Circumcised penis    Health Maintenance Due  Topic Date Due  . INFLUENZA VACCINE  07/19/2019    There are no preventive care reminders to display for this patient.  Lab Results  Component Value Date   TSH 1.050 03/14/2018  Lab Results  Component Value Date   WBC 5.6 02/17/2019   HGB 13.8 02/17/2019   HCT 39.9 02/17/2019   MCV 84 02/17/2019   PLT 233 02/17/2019   Lab Results  Component Value Date   NA 141 02/17/2019   K 3.0 (L) 02/17/2019   CO2 26 02/17/2019   GLUCOSE 107 (H) 02/17/2019   BUN 10 02/17/2019   CREATININE 0.87 02/17/2019   BILITOT 0.5 02/17/2019   ALKPHOS 51 02/17/2019   AST 26 02/17/2019   ALT 45 (H) 02/17/2019   PROT 6.6 02/17/2019   ALBUMIN 4.7 02/17/2019   CALCIUM 9.5 02/17/2019   Lab Results  Component Value Date   CHOL 145 02/17/2019   Lab Results  Component Value Date   HDL 42 02/17/2019   Lab Results  Component Value Date   LDLCALC 78 02/17/2019   Lab Results  Component Value Date   TRIG 124 02/17/2019   Lab Results  Component Value Date   CHOLHDL 3.5 02/17/2019   Lab Results  Component Value Date   HGBA1C 6.3 (H) 02/17/2019      Assessment & Plan:   Problem List Items Addressed This Visit      Other   Low testosterone   Relevant Orders   TestT+TestF+SHBG (Completed)   Ambulatory referral to Urology    Other Visit Diagnoses    Benign prostatic hyperplasia with urinary obstruction    -  Primary   Relevant Orders   PSA (Completed)   POCT urinalysis dipstick (Completed)   TestT+TestF+SHBG (Completed)   Ambulatory referral to Urology   Gross hematuria       Relevant Orders   Ambulatory referral to Urology     Due to various reasons such as pharmacy not covering testosterone and clinic not administering on time patient only received 3 doses Discussed that testosterone supplementation can  increase the growth of the prostate so will not currently administer exogenous testosterone Advised pt to follow up with Urology for evaluation  Discussed with him that the size of his prostate is probably causing bladder outlet obstruction   No orders of the defined types were placed in this encounter.   Follow-up: No follow-ups on file.    Forrest Moron, MD

## 2019-06-23 NOTE — Patient Instructions (Signed)
° ° ° °  If you have lab work done today you will be contacted with your lab results within the next 2 weeks.  If you have not heard from us then please contact us. The fastest way to get your results is to register for My Chart. ° ° °IF you received an x-ray today, you will receive an invoice from [facility]. Please contact [facility] at [phone] with questions or concerns regarding your invoice.  ° °IF you received labwork today, you will receive an invoice from [facility]. Please contact [facility] at [phone] with questions or concerns regarding your invoice.  ° °Our billing staff will not be able to assist you with questions regarding bills from these companies. ° °You will be contacted with the lab results as soon as they are available. The fastest way to get your results is to activate your My Chart account. Instructions are located on the last page of this paperwork. If you have not heard from us regarding the results in 2 weeks, please contact this office. °  ° ° ° °

## 2019-06-26 DIAGNOSIS — L281 Prurigo nodularis: Secondary | ICD-10-CM | POA: Diagnosis not present

## 2019-06-26 DIAGNOSIS — D225 Melanocytic nevi of trunk: Secondary | ICD-10-CM | POA: Diagnosis not present

## 2019-06-26 DIAGNOSIS — L308 Other specified dermatitis: Secondary | ICD-10-CM | POA: Diagnosis not present

## 2019-06-26 DIAGNOSIS — L918 Other hypertrophic disorders of the skin: Secondary | ICD-10-CM | POA: Diagnosis not present

## 2019-06-26 LAB — TESTT+TESTF+SHBG
Sex Hormone Binding: 27.4 nmol/L (ref 19.3–76.4)
Testosterone, Free: 10.3 pg/mL (ref 6.6–18.1)
Testosterone, Total, LC/MS: 277.5 ng/dL (ref 264.0–916.0)

## 2019-06-26 LAB — PSA: Prostate Specific Ag, Serum: 1.7 ng/mL (ref 0.0–4.0)

## 2019-07-21 ENCOUNTER — Other Ambulatory Visit: Payer: Self-pay | Admitting: Family Medicine

## 2019-07-21 DIAGNOSIS — E785 Hyperlipidemia, unspecified: Secondary | ICD-10-CM

## 2019-07-21 NOTE — Telephone Encounter (Signed)
Requested medications are due for refill today?  Yes  Requested medications are on the active medication list?  Yes- listed as historical  Last refill n/a  Future visit scheduled?  No   Notes to clinic - Medication is on patient's medication list as historical - does not appear to be prescribed by a provider at [place] [facility] previously.   Requested Prescriptions  Pending Prescriptions Disp Refills   losartan-hydrochlorothiazide (HYZAAR) 100-25 MG tablet [Pharmacy Med Name: LOSARTAN-HCTZ 100-25 MG TAB] 90 tablet 1    Sig: TAKE 1 TABLET BY MOUTH EVERY DAY     Cardiovascular: ARB + Diuretic Combos Failed - 07/21/2019  1:44 AM      Failed - K in normal range and within 180 days    Potassium  Date Value Ref Range Status  02/17/2019 3.0 (L) 3.5 - 5.2 mmol/L Final         Passed - Na in normal range and within 180 days    Sodium  Date Value Ref Range Status  02/17/2019 141 134 - 144 mmol/L Final         Passed - Cr in normal range and within 180 days    Creat  Date Value Ref Range Status  08/25/2016 1.04 0.70 - 1.25 mg/dL Final    Comment:      For patients > or = 67 years of age: The upper reference limit for Creatinine is approximately 13% higher for people identified as African-American.      Creatinine, Ser  Date Value Ref Range Status  02/17/2019 0.87 0.76 - 1.27 mg/dL Final         Passed - Ca in normal range and within 180 days    Calcium  Date Value Ref Range Status  02/17/2019 9.5 8.6 - 10.2 mg/dL Final         Passed - Patient is not pregnant      Passed - Last BP in normal range    BP Readings from Last 1 Encounters:  06/23/19 131/82         Passed - Valid encounter within last 6 months    Recent Outpatient Visits          4 weeks ago Benign prostatic hyperplasia with urinary obstruction   Primary Care at [facility], Zoe A, MD   1 month ago Atypical pigmented skin lesion   Primary Care at [facility], Rex Kras, MD   5 months ago  Medicare annual wellness visit, initial   Primary Care at [facility], [place] A, MD   8 months ago Low testosterone   Primary Care at [facility], [place], MD   9 months ago Low testosterone   Primary Care at [place], MD              rosuvastatin (CRESTOR) 20 MG tablet [Pharmacy Med Name: ROSUVASTATIN CALCIUM 20 MG TAB] 90 tablet 1    Sig: TAKE 1 TABLET BY MOUTH EVERY DAY     Cardiovascular:  Antilipid - Statins Passed - 07/21/2019  1:44 AM      Passed - Total Cholesterol in normal range and within 360 days    Cholesterol, Total  Date Value Ref Range Status  02/17/2019 145 100 - 199 mg/dL Final         Passed - LDL in normal range and within 360 days    LDL Calculated  Date Value Ref Range Status  02/17/2019 78 0 - 99 mg/dL Final  Passed - HDL in normal range and within 360 days    HDL  Date Value Ref Range Status  02/17/2019 42 >39 mg/dL Final         Passed - Triglycerides in normal range and within 360 days    Triglycerides  Date Value Ref Range Status  02/17/2019 124 0 - 149 mg/dL Final         Passed - Patient is not pregnant      Passed - Valid encounter within last 12 months    Recent Outpatient Visits          4 weeks ago Benign prostatic hyperplasia with urinary obstruction   Primary Care at [facility], Zoe A, MD   1 month ago Atypical pigmented skin lesion   Primary Care at [facility], Rex Kras, MD   5 months ago Medicare annual wellness visit, initial   Primary Care at [place], MD   8 months ago Low testosterone   Primary Care at [place], MD   9 months ago Low testosterone   Primary Care at [facility], Arlie Solomons, MD

## 2019-08-12 DIAGNOSIS — N400 Enlarged prostate without lower urinary tract symptoms: Secondary | ICD-10-CM | POA: Diagnosis not present

## 2019-08-12 DIAGNOSIS — R310 Gross hematuria: Secondary | ICD-10-CM | POA: Diagnosis not present

## 2019-08-19 DIAGNOSIS — R310 Gross hematuria: Secondary | ICD-10-CM | POA: Diagnosis not present

## 2019-08-19 DIAGNOSIS — I700 Atherosclerosis of aorta: Secondary | ICD-10-CM | POA: Diagnosis not present

## 2019-08-19 DIAGNOSIS — N281 Cyst of kidney, acquired: Secondary | ICD-10-CM | POA: Diagnosis not present

## 2019-08-26 ENCOUNTER — Other Ambulatory Visit (HOSPITAL_COMMUNITY): Payer: Self-pay | Admitting: Urology

## 2019-08-26 DIAGNOSIS — N400 Enlarged prostate without lower urinary tract symptoms: Secondary | ICD-10-CM | POA: Diagnosis not present

## 2019-08-26 DIAGNOSIS — R310 Gross hematuria: Secondary | ICD-10-CM | POA: Diagnosis not present

## 2019-08-26 DIAGNOSIS — D49512 Neoplasm of unspecified behavior of left kidney: Secondary | ICD-10-CM | POA: Diagnosis not present

## 2019-09-02 ENCOUNTER — Other Ambulatory Visit: Payer: Self-pay

## 2019-09-02 ENCOUNTER — Ambulatory Visit (HOSPITAL_COMMUNITY)
Admission: RE | Admit: 2019-09-02 | Discharge: 2019-09-02 | Disposition: A | Discharge status: Home / Self Care | Payer: Medicare HMO | Source: Ambulatory Visit | Attending: Urology | Admitting: Urology

## 2019-09-02 DIAGNOSIS — D49512 Neoplasm of unspecified behavior of left kidney: Secondary | ICD-10-CM | POA: Diagnosis not present

## 2019-09-02 DIAGNOSIS — N281 Cyst of kidney, acquired: Secondary | ICD-10-CM | POA: Diagnosis not present

## 2019-09-02 MED ORDER — GADOBUTROL 1 MMOL/ML IV SOLN
10.0000 mL | Freq: Once | INTRAVENOUS | Status: AC | PRN
  Administered 2019-09-02: 10 mL via INTRAVENOUS

## 2019-10-20 ENCOUNTER — Encounter: Payer: Self-pay | Admitting: Family Medicine

## 2019-10-20 ENCOUNTER — Ambulatory Visit (INDEPENDENT_AMBULATORY_CARE_PROVIDER_SITE_OTHER): Payer: Medicare HMO | Admitting: Family Medicine

## 2019-10-20 ENCOUNTER — Other Ambulatory Visit: Payer: Self-pay

## 2019-10-20 ENCOUNTER — Other Ambulatory Visit (HOSPITAL_COMMUNITY)
Admission: RE | Admit: 2019-10-20 | Discharge: 2019-10-20 | Disposition: A | Discharge status: Home / Self Care | Payer: Medicare HMO | Source: Ambulatory Visit | Attending: Family Medicine | Admitting: Family Medicine

## 2019-10-20 VITALS — BP 139/80 | HR 68 | Temp 97.4°F | Wt 197.6 lb

## 2019-10-20 DIAGNOSIS — G8929 Other chronic pain: Secondary | ICD-10-CM | POA: Diagnosis not present

## 2019-10-20 DIAGNOSIS — M5442 Lumbago with sciatica, left side: Secondary | ICD-10-CM

## 2019-10-20 DIAGNOSIS — Z23 Encounter for immunization: Secondary | ICD-10-CM

## 2019-10-20 DIAGNOSIS — M19019 Primary osteoarthritis, unspecified shoulder: Secondary | ICD-10-CM | POA: Diagnosis not present

## 2019-10-20 DIAGNOSIS — R7303 Prediabetes: Secondary | ICD-10-CM

## 2019-10-20 DIAGNOSIS — M1712 Unilateral primary osteoarthritis, left knee: Secondary | ICD-10-CM

## 2019-10-20 DIAGNOSIS — Z113 Encounter for screening for infections with a predominantly sexual mode of transmission: Secondary | ICD-10-CM | POA: Insufficient documentation

## 2019-10-20 DIAGNOSIS — R69 Illness, unspecified: Secondary | ICD-10-CM | POA: Diagnosis not present

## 2019-10-20 DIAGNOSIS — M5441 Lumbago with sciatica, right side: Secondary | ICD-10-CM

## 2019-10-20 NOTE — Progress Notes (Signed)
 Established Patient Office Visit  Subjective:  Patient ID: Bryce Rodriguez, male    DOB: 07/10/1951  Age: 68 y.o. MRN: [id number]  CC:  Chief Complaint  Patient presents with  . Exposure to STD    patient would like hiv and std testing done    HPI Bryce Rodriguez presents for follow up on Joint Pain  Arthritis Patient reports that he only takes his meloxicam when he has severe pain He takes his gabapentin at bedtime It helps the pain and cramping as well as tingling pain in his hands It does not help his back pain He takes the flexeril sparingly He did not follow up with PT but saw the Orthopedic Surgeon who recommended PT.  Prediabetes He also would like labs to see how his sugars are doing Lab Results  Component Value Date   HGBA1C 6.3 (H) 02/17/2019   He states that he is eats anything he wants He feels like he has to have something sweet to eat He snacks at night on ice cream or cookies He denies increased polyuria or polydipsia  Wt Readings from Last 3 Encounters:  10/20/19 197 lb 9.6 oz (89.6 kg)  06/23/19 195 lb 6.4 oz (88.6 kg)  06/17/19 195 lb 6.4 oz (88.6 kg)    Hypertension: Patient here for follow-up of elevated blood pressure. He is exercising doing physical activity and is adherent to low salt diet.  Blood pressure is well controlled at home. Cardiac symptoms none. Patient denies chest pain, claudication, dyspnea, exertional chest pressure/discomfort, irregular heart beat, lower extremity edema and orthopnea.  Cardiovascular risk factors: dyslipidemia, hypertension and male gender. Use of agents associated with hypertension: NSAIDS. History of target organ damage: none. BP Readings from Last 3 Encounters:  10/20/19 139/80  06/23/19 131/82  06/17/19 137/79   Screening for std Patient also desires std testing just to be cautious. He denies penile discharge Enlarged lymph nodes Or urinary symptoms He denies lesions on his penis  Past Medical History:   Diagnosis Date  . Arthritis 02/24/2014   arms, hands  . Class 1 obesity due to excess calories without serious comorbidity with body mass index (BMI) of 32.0 to 32.9 in adult 06/12/2017  . Environmental allergies 02/06/2017  . HLD (hyperlipidemia)   . HTN (hypertension)   . Hypercholesteremia 01/23/2012  . Mixed hyperlipidemia 06/12/2017  . Numbness and tingling in hands 05/01/2014   04/17/2014. [facility].  John Scoles, MD.  Radiographs: These do reveal obvious degenerative disc disease at C5-6. There is no instability noted. Plan: We will proceed with getting surgery set up at patient's earliest convenience.   . Prediabetes 06/12/2017    Past Surgical History:  Procedure Laterality Date  . COLONOSCOPY    . NECK SURGERY  May 19 2014  . none      Family History  Problem Relation Age of Onset  . Stomach cancer Father   . Cancer Other   . Hypertension Other   . Colon cancer Neg Hx   . Esophageal cancer Neg Hx   . Rectal cancer Neg Hx     Social History   Socioeconomic History  . Marital status: Married    Spouse name: Not on file  . Number of children: 2  . Years of education: Not on file  . Highest education level: Not on file  Occupational History  . Occupation: MANAGEMENT    Employer: [employer]  Social Needs  . Financial resource strain:   Not on file  . Food insecurity    Worry: Not on file    Inability: Not on file  . Transportation needs    Medical: Not on file    Non-medical: Not on file  Tobacco Use  . Smoking status: Never Smoker  . Smokeless tobacco: Current User    Types: Snuff  Substance and Sexual Activity  . Alcohol use: Yes    Alcohol/week: 12.0 standard drinks    Types: 6 Cans of beer, 4 Shots of liquor, 2 Standard drinks or equivalent per week  . Drug use: No  . Sexual activity: Not on file  Lifestyle  . Physical activity    Days per week: Not on file    Minutes per session: Not on file  . Stress: Not  on file  Relationships  . Social connections    Talks on phone: Not on file    Gets together: Not on file    Attends religious service: Not on file    Active member of club or organization: Not on file    Attends meetings of clubs or organizations: Not on file    Relationship status: Not on file  . Intimate partner violence    Fear of current or ex partner: Not on file    Emotionally abused: Not on file    Physically abused: Not on file    Forced sexual activity: Not on file  Other Topics Concern  . Not on file  Social History Narrative  . Not on file    Outpatient Medications Prior to Visit  Medication Sig Dispense Refill  . amLODipine (NORVASC) 10 MG tablet TAKE 1 TABLET BY MOUTH EVERY DAY 90 tablet 1  . aspirin 81 MG tablet Take 81 mg by mouth daily.    . cyclobenzaprine (FLEXERIL) 10 MG tablet Take 1 tablet (10 mg total) by mouth 3 (three) times daily as needed for muscle spasms. 30 tablet 1  . gabapentin (NEURONTIN) 300 MG capsule TAKE 1 CAPSULE BY MOUTH AT BEDTIME. FOR TINGLING AND NUMBNESS 90 capsule 1  . losartan-hydrochlorothiazide (HYZAAR) 100-25 MG tablet TAKE 1 TABLET BY MOUTH EVERY DAY 90 tablet 1  . meloxicam (MOBIC) 15 MG tablet Take 1 tablet (15 mg total) by mouth daily. With food 90 tablet 3  . rosuvastatin (CRESTOR) 20 MG tablet TAKE 1 TABLET BY MOUTH EVERY DAY 90 tablet 1  . tadalafil (CIALIS) 5 MG tablet Take 1 tablet (5 mg total) by mouth daily as needed for erectile dysfunction. 10 tablet 11  . testosterone cypionate (DEPOTESTOSTERONE CYPIONATE) 200 MG/ML injection Inject 1 mL (200 mg total) into the muscle every 14 (fourteen) days. 2 mL 5   Facility-Administered Medications Prior to Visit  Medication Dose Route Frequency Provider Last Rate Last Dose  . testosterone cypionate (DEPOTESTOSTERONE CYPIONATE) injection 200 mg  200 mg Intramuscular Q14 Days Stallings, Zoe A, MD   200 mg at 09/30/18 1050    No Known Allergies  ROS Review of Systems Review of  Systems  Constitutional: Negative for activity change, appetite change, chills and fever.  HENT: Negative for congestion, nosebleeds, trouble swallowing and voice change.   Respiratory: Negative for cough, shortness of breath and wheezing.   Gastrointestinal: Negative for diarrhea, nausea and vomiting.  Genitourinary: Negative for difficulty urinating, dysuria, flank pain and hematuria.  Musculoskeletal: see hpi Neurological: Negative for dizziness, speech difficulty, light-headedness and numbness.  See HPI. All other review of systems negative.     Objective:  BP 139/80 (BP   Location: Right Arm, Patient Position: Sitting, Cuff Size: Large)   Pulse 68   Temp (!) 97.4 F (36.3 C) (Oral)   Wt 197 lb 9.6 oz (89.6 kg)   SpO2 97%   BMI 32.38 kg/m  Wt Readings from Last 3 Encounters:  10/20/19 197 lb 9.6 oz (89.6 kg)  06/23/19 195 lb 6.4 oz (88.6 kg)  06/17/19 195 lb 6.4 oz (88.6 kg)    Physical Exam  Constitutional: He is oriented to person, place, and time. He appears well-developed and well-nourished.  HENT:  Head: Normocephalic and atraumatic.  Eyes: Conjunctivae and EOM are normal.  Neck: Normal range of motion. Neck supple.  Cardiovascular: Normal rate, regular rhythm and normal heart sounds.  No murmur heard. Pulmonary/Chest: Effort normal and breath sounds normal. No respiratory distress. He has no wheezes. He has no rales.  Abdominal: Soft. Bowel sounds are normal. He exhibits no distension. There is no abdominal tenderness. There is no rebound and no guarding.  Musculoskeletal:     Right knee: He exhibits normal range of motion, no swelling, no effusion, no ecchymosis, no deformity and no laceration. No tenderness found.     Left knee: He exhibits effusion. He exhibits normal range of motion, no swelling, no ecchymosis, no deformity, no laceration, no erythema and normal alignment. Tenderness found. Patellar tendon tenderness noted. No medial joint line, no lateral joint  line, no MCL and no LCL tenderness noted.  Neurological: He is alert and oriented to person, place, and time.  Psychiatric: He has a normal mood and affect. His behavior is normal. Judgment and thought content normal.      Health Maintenance Due  Topic Date Due  . INFLUENZA VACCINE  07/19/2019    There are no preventive care reminders to display for this patient.  Lab Results  Component Value Date   TSH 1.050 03/14/2018   Lab Results  Component Value Date   WBC 5.6 02/17/2019   HGB 13.8 02/17/2019   HCT 39.9 02/17/2019   MCV 84 02/17/2019   PLT 233 02/17/2019   Lab Results  Component Value Date   NA 141 02/17/2019   K 3.0 (L) 02/17/2019   CO2 26 02/17/2019   GLUCOSE 107 (H) 02/17/2019   BUN 10 02/17/2019   CREATININE 0.87 02/17/2019   BILITOT 0.5 02/17/2019   ALKPHOS 51 02/17/2019   AST 26 02/17/2019   ALT 45 (H) 02/17/2019   PROT 6.6 02/17/2019   ALBUMIN 4.7 02/17/2019   CALCIUM 9.5 02/17/2019   Lab Results  Component Value Date   CHOL 145 02/17/2019   Lab Results  Component Value Date   HDL 42 02/17/2019   Lab Results  Component Value Date   LDLCALC 78 02/17/2019   Lab Results  Component Value Date   TRIG 124 02/17/2019   Lab Results  Component Value Date   CHOLHDL 3.5 02/17/2019   Lab Results  Component Value Date   HGBA1C 6.3 (H) 02/17/2019      Assessment & Plan:   Problem List Items Addressed This Visit      Musculoskeletal and Integument   Shoulder arthritis - discussed pain management   Relevant Orders   Ambulatory referral to Physical Therapy     Other   Prediabetes - discussed diabetes prevention    Relevant Orders   Hemoglobin A1c   CMP14+EGFR    Other Visit Diagnoses    Need for prophylactic vaccination and inoculation against influenza    -  Primary  Relevant Orders   Flu Vaccine QUAD High Dose(Fluad) (Completed)   Screening examination for STD (sexually transmitted disease)    - discussed blood testing for std     Relevant Orders   HIV Antibody (routine testing w rflx)   GC/Chlamydia probe amp ([place])not at [facility]   RPR   Hepatitis B surface antigen   Arthritis of left knee    -  Advised to take  meloxicam for his knee pain earlier in the day as the more severe the pain the less effective the medication will be   Relevant Orders   Ambulatory referral to Physical Therapy   Chronic midline low back pain with bilateral sciatica    -  Discussed gabapentin, discussed PT for evaluation and treatment   Relevant Orders   Ambulatory referral to Physical Therapy      No orders of the defined types were placed in this encounter.   Follow-up: No follow-ups on file.    Zoe A Stallings, MD 

## 2019-10-20 NOTE — Patient Instructions (Addendum)
   If you have lab work done today you will be contacted with your lab results within the next 2 weeks.  If you have not heard from us then please contact us. The fastest way to get your results is to register for My Chart.   IF you received an x-ray today, you will receive an invoice from [facility]. Please contact [facility] at [phone] with questions or concerns regarding your invoice.   IF you received labwork today, you will receive an invoice from [facility]. Please contact [facility] at [phone] with questions or concerns regarding your invoice.   Our billing staff will not be able to assist you with questions regarding bills from these companies.  You will be contacted with the lab results as soon as they are available. The fastest way to get your results is to activate your My Chart account. Instructions are located on the last page of this paperwork. If you have not heard from us regarding the results in 2 weeks, please contact this office.      Prediabetes Prediabetes is the condition of having a blood sugar (blood glucose) level that is higher than it should be, but not high enough for you to be diagnosed with type 2 diabetes. Having prediabetes puts you at risk for developing type 2 diabetes (type 2 diabetes mellitus). Prediabetes may be called impaired glucose tolerance or impaired fasting glucose. Prediabetes usually does not cause symptoms. Your health care provider can diagnose this condition with blood tests. You may be tested for prediabetes if you are overweight and if you have at least one other risk factor for prediabetes. What is blood glucose, and how is it measured? Blood glucose refers to the amount of glucose in your bloodstream. Glucose comes from eating foods that contain sugars and starches (carbohydrates), which the body breaks down into glucose. Your blood glucose level may be measured in mg/dL (milligrams per deciliter) or mmol/L  (millimoles per liter). Your blood glucose may be checked with one or more of the following blood tests:  A fasting blood glucose (FBG) test. You will not be allowed to eat (you will fast) for 8 hours or longer before a blood sample is taken. ? A normal range for FBG is 70-100 mg/dl (3.9-5.6 mmol/L).  An A1c (hemoglobin A1c) blood test. This test provides information about blood glucose control over the previous 2?3months.  An oral glucose tolerance test (OGTT). This test measures your blood glucose at two times: ? After fasting. This is your baseline level. ? Two hours after you drink a beverage that contains glucose. You may be diagnosed with prediabetes:  If your FBG is 100?125 mg/dL (5.6-6.9 mmol/L).  If your A1c level is 5.7?6.4%.  If your OGTT result is 140?199 mg/dL (7.8-11 mmol/L). These blood tests may be repeated to confirm your diagnosis. How can this condition affect me? The pancreas produces a hormone (insulin) that helps to move glucose from the bloodstream into cells. When cells in the body do not respond properly to insulin that the body makes (insulin resistance), excess glucose builds up in the blood instead of going into cells. As a result, high blood glucose (hyperglycemia) can develop, which can cause many complications. Hyperglycemia is a symptom of prediabetes. Having high blood glucose for a long time is dangerous. Too much glucose in your blood can damage your nerves and blood vessels. Long-term damage can lead to complications from diabetes, which may include:  Heart disease.  Stroke.  Blindness.    Kidney disease.  Depression.  Poor circulation in the feet and legs, which could lead to surgical removal (amputation) in severe cases. What can increase my risk? Risk factors for prediabetes include:  Having a family member with type 2 diabetes.  Being overweight or obese.  Being older than age 45.  Being of American Indian, African-American,  Hispanic/Latino, or Asian/Pacific Islander descent.  Having an inactive (sedentary) lifestyle.  Having a history of heart disease.  History of gestational diabetes or polycystic ovary syndrome (PCOS), in women.  Having low levels of good cholesterol (HDL-C) or high levels of blood fats (triglycerides).  Having high blood pressure. What actions can I take to prevent diabetes?      Be physically active. ? Do moderate-intensity physical activity for 30 or more minutes on 5 or more days of the week, or as much as told by your health care provider. This could be brisk walking, biking, or water aerobics. ? Ask your health care provider what activities are safe for you. A mix of physical activities may be best, such as walking, swimming, cycling, and strength training.  Lose weight as told by your health care provider. ? Losing 5-7% of your body weight can reverse insulin resistance. ? Your health care provider can determine how much weight loss is best for you and can help you lose weight safely.  Follow a healthy meal plan. This includes eating lean proteins, complex carbohydrates, fresh fruits and vegetables, low-fat dairy products, and healthy fats. ? Follow instructions from your health care provider about eating or drinking restrictions. ? Make an appointment to see a diet and nutrition specialist (registered dietitian) to help you create a healthy eating plan that is right for you.  Do not smoke or use any tobacco products, such as cigarettes, chewing tobacco, and e-cigarettes. If you need help quitting, ask your health care provider.  Take over-the-counter and prescription medicines as told by your health care provider. You may be prescribed medicines that help lower the risk of type 2 diabetes.  Keep all follow-up visits as told by your health care provider. This is important. Summary  Prediabetes is the condition of having a blood sugar (blood glucose) level that is higher than  it should be, but not high enough for you to be diagnosed with type 2 diabetes.  Having prediabetes puts you at risk for developing type 2 diabetes (type 2 diabetes mellitus).  To help prevent type 2 diabetes, make lifestyle changes such as being physically active and eating a healthy diet. Lose weight as told by your health care provider. This information is not intended to replace advice given to you by your health care provider. Make sure you discuss any questions you have with your health care provider. Document Released: 03/27/2016 Document Revised: 03/28/2019 Document Reviewed: 01/25/2016 Elsevier Patient Education  2020 [employer] Inc.  

## 2019-10-21 LAB — CMP14+EGFR
ALT: 44 IU/L (ref 0–44)
AST: 21 IU/L (ref 0–40)
Albumin/Globulin Ratio: 2.1 (ref 1.2–2.2)
Albumin: 4.7 g/dL (ref 3.8–4.8)
Alkaline Phosphatase: 55 IU/L (ref 39–117)
BUN/Creatinine Ratio: 15 (ref 10–24)
BUN: 14 mg/dL (ref 8–27)
Bilirubin Total: 0.4 mg/dL (ref 0.0–1.2)
CO2: 27 mmol/L (ref 20–29)
Calcium: 9.9 mg/dL (ref 8.6–10.2)
Chloride: 101 mmol/L (ref 96–106)
Creatinine, Ser: 0.95 mg/dL (ref 0.76–1.27)
GFR calc Af Amer: 95 mL/min/{1.73_m2} (ref >59)
GFR calc non Af Amer: 82 mL/min/{1.73_m2} (ref >59)
Globulin, Total: 2.2 g/dL (ref 1.5–4.5)
Glucose: 104 mg/dL — ABNORMAL HIGH (ref 65–99)
Potassium: 3.2 mmol/L — ABNORMAL LOW (ref 3.5–5.2)
Sodium: 140 mmol/L (ref 134–144)
Total Protein: 6.9 g/dL (ref 6.0–8.5)

## 2019-10-21 LAB — HIV ANTIBODY (ROUTINE TESTING W REFLEX): HIV Screen 4th Generation wRfx: NONREACTIVE

## 2019-10-21 LAB — GC/CHLAMYDIA PROBE AMP (~~LOC~~) NOT AT ARMC
Chlamydia: NEGATIVE
Comment: NEGATIVE
Comment: NORMAL
Neisseria Gonorrhea: NEGATIVE

## 2019-10-21 LAB — HEMOGLOBIN A1C
Est. average glucose Bld gHb Est-mCnc: 128 mg/dL
Hgb A1c MFr Bld: 6.1 % — ABNORMAL HIGH (ref 4.8–5.6)

## 2019-10-21 LAB — RPR: RPR Ser Ql: NONREACTIVE

## 2019-10-21 LAB — HEPATITIS B SURFACE ANTIGEN: Hepatitis B Surface Ag: NEGATIVE

## 2019-12-04 ENCOUNTER — Other Ambulatory Visit: Payer: Self-pay | Admitting: Family Medicine

## 2019-12-04 DIAGNOSIS — N5203 Combined arterial insufficiency and corporo-venous occlusive erectile dysfunction: Secondary | ICD-10-CM

## 2019-12-05 DIAGNOSIS — M1990 Unspecified osteoarthritis, unspecified site: Secondary | ICD-10-CM | POA: Diagnosis not present

## 2019-12-05 DIAGNOSIS — I739 Peripheral vascular disease, unspecified: Secondary | ICD-10-CM | POA: Diagnosis not present

## 2019-12-05 DIAGNOSIS — I509 Heart failure, unspecified: Secondary | ICD-10-CM | POA: Diagnosis not present

## 2019-12-05 DIAGNOSIS — E669 Obesity, unspecified: Secondary | ICD-10-CM | POA: Diagnosis not present

## 2019-12-05 DIAGNOSIS — I110 Hypertensive heart disease with heart failure: Secondary | ICD-10-CM | POA: Diagnosis not present

## 2019-12-05 DIAGNOSIS — Z6832 Body mass index (BMI) 32.0-32.9, adult: Secondary | ICD-10-CM | POA: Diagnosis not present

## 2019-12-05 DIAGNOSIS — G629 Polyneuropathy, unspecified: Secondary | ICD-10-CM | POA: Diagnosis not present

## 2019-12-05 DIAGNOSIS — Z7982 Long term (current) use of aspirin: Secondary | ICD-10-CM | POA: Diagnosis not present

## 2019-12-05 DIAGNOSIS — G8929 Other chronic pain: Secondary | ICD-10-CM | POA: Diagnosis not present

## 2019-12-05 DIAGNOSIS — E785 Hyperlipidemia, unspecified: Secondary | ICD-10-CM | POA: Diagnosis not present

## 2019-12-29 ENCOUNTER — Telehealth: Payer: Self-pay

## 2019-12-29 NOTE — Telephone Encounter (Signed)
Pt. Called to let Dr. Nolon Rod know he had been tested positive for covid, said he did not need a visit

## 2019-12-29 NOTE — Telephone Encounter (Signed)
FYI

## 2020-01-14 ENCOUNTER — Other Ambulatory Visit: Payer: Self-pay | Admitting: Family Medicine

## 2020-01-14 DIAGNOSIS — I10 Essential (primary) hypertension: Secondary | ICD-10-CM

## 2020-01-23 ENCOUNTER — Other Ambulatory Visit: Payer: Self-pay | Admitting: Family Medicine

## 2020-01-23 DIAGNOSIS — E785 Hyperlipidemia, unspecified: Secondary | ICD-10-CM

## 2020-02-03 ENCOUNTER — Telehealth: Payer: Self-pay | Admitting: Family Medicine

## 2020-02-03 NOTE — Telephone Encounter (Signed)
Copied from [place] [id number]. Topic: General - Call Back - No Documentation >> Feb 03, 2020 12:08 PM Erick Blinks wrote: Best contact: [phone]  Call back request, regarding receiving Covid 19 vaccine. Patient tested positive 12/24/2019

## 2020-02-06 NOTE — Telephone Encounter (Signed)
Will be receiving his first vaccine on Tue. 02/10/20. He was asking if that was alright. I confirmed it was.

## 2020-03-08 ENCOUNTER — Other Ambulatory Visit: Payer: Self-pay | Admitting: Family Medicine

## 2020-03-08 DIAGNOSIS — E785 Hyperlipidemia, unspecified: Secondary | ICD-10-CM

## 2020-03-08 NOTE — Telephone Encounter (Signed)
Requested Prescriptions  Pending Prescriptions Disp Refills  . rosuvastatin (CRESTOR) 20 MG tablet [Pharmacy Med Name: ROSUVASTATIN CALCIUM 20 MG TAB] 90 tablet 0    Sig: TAKE 1 TABLET BY MOUTH EVERY DAY     Cardiovascular:  Antilipid - Statins Failed - 03/08/2020  1:42 PM      Failed - Total Cholesterol in normal range and within 360 days    Cholesterol, Total  Date Value Ref Range Status  02/17/2019 145 100 - 199 mg/dL Final         Failed - LDL in normal range and within 360 days    LDL Calculated  Date Value Ref Range Status  02/17/2019 78 0 - 99 mg/dL Final         Failed - HDL in normal range and within 360 days    HDL  Date Value Ref Range Status  02/17/2019 42 >39 mg/dL Final         Failed - Triglycerides in normal range and within 360 days    Triglycerides  Date Value Ref Range Status  02/17/2019 124 0 - 149 mg/dL Final         Passed - Patient is not pregnant      Passed - Valid encounter within last 12 months    Recent Outpatient Visits          4 months ago Chronic midline low back pain with bilateral sciatica   [facility] at [facility], Zoe A, MD   8 months ago Benign prostatic hyperplasia with urinary obstruction   Primary Care at [facility], Zoe A, MD   8 months ago Atypical pigmented skin lesion   Primary Care at [facility], Rex Kras, MD   1 year ago Medicare annual wellness visit, initial   Primary Care at [facility], Arlie Solomons, MD   1 year ago Low testosterone   Primary Care at [place], MD      Future Appointments            Tomorrow  Primary Care at [place], [facility]   In 1 month Forrest Moron, MD Primary Care at [place], [facility]

## 2020-03-09 ENCOUNTER — Ambulatory Visit (INDEPENDENT_AMBULATORY_CARE_PROVIDER_SITE_OTHER): Payer: Medicare HMO | Admitting: Family Medicine

## 2020-03-09 VITALS — BP 139/80 | Ht 65.5 in | Wt 197.0 lb

## 2020-03-09 DIAGNOSIS — Z0000 Encounter for general adult medical examination without abnormal findings: Secondary | ICD-10-CM

## 2020-03-09 NOTE — Patient Instructions (Signed)
Thank you for taking time to come for your Medicare Wellness Visit. I appreciate your ongoing commitment to your health goals. Please review the following plan we discussed and let me know if I can assist you in the future.  Bryce Kennedy LPN  Preventive Care 68 Years and Older, Male Preventive care refers to lifestyle choices and visits with your health care provider that can promote health and wellness. This includes:  A yearly physical exam. This is also called an annual well check.  Regular dental and eye exams.  Immunizations.  Screening for certain conditions.  Healthy lifestyle choices, such as diet and exercise. What can I expect for my preventive care visit? Physical exam Your health care provider will check:  Height and weight. These may be used to calculate body mass index (BMI), which is a measurement that tells if you are at a healthy weight.  Heart rate and blood pressure.  Your skin for abnormal spots. Counseling Your health care provider may ask you questions about:  Alcohol, tobacco, and drug use.  Emotional well-being.  Home and relationship well-being.  Sexual activity.  Eating habits.  History of falls.  Memory and ability to understand (cognition).  Work and work Statistician. What immunizations do I need?  Influenza (flu) vaccine  This is recommended every year. Tetanus, diphtheria, and pertussis (Tdap) vaccine  You may need a Td booster every 10 years. Varicella (chickenpox) vaccine  You may need this vaccine if you have not already been vaccinated. Zoster (shingles) vaccine  You may need this after age 66. Pneumococcal conjugate (PCV13) vaccine  One dose is recommended after age 84. Pneumococcal polysaccharide (PPSV23) vaccine  One dose is recommended after age 88. Measles, mumps, and rubella (MMR) vaccine  You may need at least one dose of MMR if you were born in 1957 or later. You may also need a second dose. Meningococcal  conjugate (MenACWY) vaccine  You may need this if you have certain conditions. Hepatitis A vaccine  You may need this if you have certain conditions or if you travel or work in places where you may be exposed to hepatitis A. Hepatitis B vaccine  You may need this if you have certain conditions or if you travel or work in places where you may be exposed to hepatitis B. Haemophilus influenzae type b (Hib) vaccine  You may need this if you have certain conditions. You may receive vaccines as individual doses or as more than one vaccine together in one shot (combination vaccines). Talk with your health care provider about the risks and benefits of combination vaccines. What tests do I need? Blood tests  Lipid and cholesterol levels. These may be checked every 5 years, or more frequently depending on your overall health.  Hepatitis C test.  Hepatitis B test. Screening  Lung cancer screening. You may have this screening every year starting at age 24 if you have a 30-pack-year history of smoking and currently smoke or have quit within the past 15 years.  Colorectal cancer screening. All adults should have this screening starting at age 52 and continuing until age 61. Your health care provider may recommend screening at age 57 if you are at increased risk. You will have tests every 1-10 years, depending on your results and the type of screening test.  Prostate cancer screening. Recommendations will vary depending on your family history and other risks.  Diabetes screening. This is done by checking your blood sugar (glucose) after you have not eaten for  a while (fasting). You may have this done every 1-3 years.  Abdominal aortic aneurysm (AAA) screening. You may need this if you are a current or former smoker.  Sexually transmitted disease (STD) testing. Follow these instructions at home: Eating and drinking  Eat a diet that includes fresh fruits and vegetables, whole grains, lean  protein, and low-fat dairy products. Limit your intake of foods with high amounts of sugar, saturated fats, and salt.  Take vitamin and mineral supplements as recommended by your health care provider.  Do not drink alcohol if your health care provider tells you not to drink.  If you drink alcohol: ? Limit how much you have to 0-2 drinks a day. ? Be aware of how much alcohol is in your drink. In the [place] one drink equals one 12 oz bottle of beer (355 mL), one 5 oz glass of wine (148 mL), or one 1 oz glass of hard liquor (44 mL). Lifestyle  Take daily care of your teeth and gums.  Stay active. Exercise for at least 30 minutes on 5 or more days each week.  Do not use any products that contain nicotine or tobacco, such as cigarettes, e-cigarettes, and chewing tobacco. If you need help quitting, ask your health care provider.  If you are sexually active, practice safe sex. Use a condom or other form of protection to prevent STIs (sexually transmitted infections).  Talk with your health care provider about taking a low-dose aspirin or statin. What's next?  Visit your health care provider once a year for a well check visit.  Ask your health care provider how often you should have your eyes and teeth checked.  Stay up to date on all vaccines. This information is not intended to replace advice given to you by your health care provider. Make sure you discuss any questions you have with your health care provider. Document Revised: 11/28/2018 Document Reviewed: 11/28/2018 Elsevier Patient Education  2020 Elsevier Inc.  

## 2020-03-09 NOTE — Progress Notes (Signed)
Presents today for [employer] Visit   Date of last exam: 10/20/2019  Interpreter used for this visit? No  I connected with  Tahmid W Harcum on 03/09/20 by a telephone  application and verified that I am speaking with the correct person using two identifiers.   I discussed the limitations of evaluation and management by telemedicine. The patient expressed understanding and agreed to proceed.   Patient Care Team: Forrest Moron, MD as PCP - General (Internal Medicine)   Other items to address today:   Discussed immunizations Discussed Eye/Dental COVID shots completed Pfizer  Other Screening: Last screening for diabetes: 10/20/2019 Last lipid screening: 02/17/2019  ADVANCE DIRECTIVES: Discussed:yes On File: no Materials Provided:no  Immunization status:  Immunization History  Administered Date(s) Administered  . Fluad Quad(high Dose 65+) 10/20/2019  . Influenza Split 09/18/2012  . Influenza, High Dose Seasonal PF 11/21/2018  . Influenza-Unspecified 09/18/2012, 10/24/2014, 08/18/2017  . Pneumococcal Conjugate-13 01/31/2018  . Pneumococcal Polysaccharide-23 02/17/2019     Health Maintenance Due  Topic Date Due  . TETANUS/TDAP  Never done     Functional Status Survey: Is the patient deaf or have difficulty hearing?: No Does the patient have difficulty seeing, even when wearing glasses/contacts?: No Does the patient have difficulty concentrating, remembering, or making decisions?: No Does the patient have difficulty walking or climbing stairs?: Yes(trouble with lower back some times has trouble) Does the patient have difficulty dressing or bathing?: No Does the patient have difficulty doing errands alone such as visiting a doctor's office or shopping?: No   6CIT Screen 03/09/2020  What Year? 0 points  What month? 0 points  What time? 0 points  Count back from 20 0 points  Months in reverse 0 points  Repeat phrase 0 points  Total Score 0        Clinical Support from 03/09/2020 in [place] [facility]  AUDIT-C Score  7       Home Environment:   Lives in a one story home No scattered rugs No grab bars  Adequate lighting/no clutter  Yes  trouble climbing sometimes lower back pain   Patient Active Problem List   Diagnosis Date Noted  . Tick bite of left upper arm 06/17/2019  . Atypical pigmented skin lesion 06/17/2019  . Spider bite 06/17/2019  . Encounter for monitoring testosterone replacement therapy 11/21/2018  . Dyslipidemia 11/21/2018  . 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 Shoulder arthritis 11/21/2018  . Low testosterone 08/29/2018  . Mixed hyperlipidemia 06/12/2017  . Class 1 obesity due to excess calories without serious comorbidity with body mass index (BMI) of 32.0 to 32.9 in adult 06/12/2017  . Prediabetes 06/12/2017  . Environmental allergies 02/06/2017  . Numbness and tingling in both hands 05/01/2014  . Arthritis 02/24/2014  . HTN (hypertension) 01/23/2012  . Hypercholesteremia 01/23/2012     Past Medical History:  Diagnosis Date  . Arthritis 02/24/2014   arms, hands  . Class 1 obesity due to excess calories without serious comorbidity with body mass index (BMI) of 32.0 to 32.9 in adult 06/12/2017  . Environmental allergies 02/06/2017  . HLD (hyperlipidemia)   . HTN (hypertension)   . Hypercholesteremia 01/23/2012  . Mixed hyperlipidemia 06/12/2017  . Numbness and tingling in hands 05/01/2014   04/17/2014. [place].  Dorna Leitz, MD.  Radiographs: These do reveal obvious degenerative disc disease at C5-6. There is no instability noted. Plan: We will proceed  with getting surgery set up at patient's earliest convenience.   . Prediabetes 06/12/2017     Past Surgical History:  Procedure Laterality Date  . COLONOSCOPY    . NECK SURGERY  May 19 2014  . none       Family History  Problem Relation  Age of Onset  . Stomach cancer Father   . Cancer Other   . Hypertension Other   . Colon cancer Neg Hx   . Esophageal cancer Neg Hx   . Rectal cancer Neg Hx      Social History   Socioeconomic History  . Marital status: Married    Spouse name: Not on file  . Number of children: 2  . Years of education: Not on file  . Highest education level: Not on file  Occupational History  . Occupation: MANAGEMENT    Employer: [employer]  Tobacco Use  . Smoking status: Never Smoker  . Smokeless tobacco: Current User    Types: Snuff  Substance and Sexual Activity  . Alcohol use: Yes    Alcohol/week: 12.0 standard drinks    Types: 6 Cans of beer, 4 Shots of liquor, 2 Standard drinks or equivalent per week  . Drug use: No  . Sexual activity: Not on file  Other Topics Concern  . Not on file  Social History Narrative  . Not on file   Social Determinants of Health   Financial Resource Strain:   . Difficulty of Paying Living Expenses:   Food Insecurity:   . Worried About Charity fundraiser in the Last Year:   . Arboriculturist in the Last Year:   Transportation Needs:   . Film/video editor (Medical):   Marland Kitchen Lack of Transportation (Non-Medical):   Physical Activity:   . Days of Exercise per Week:   . Minutes of Exercise per Session:   Stress:   . Feeling of Stress :   Social Connections:   . Frequency of Communication with Friends and Family:   . Frequency of Social Gatherings with Friends and Family:   . Attends Religious Services:   . Active Member of Clubs or Organizations:   . Attends Archivist Meetings:   Marland Kitchen Marital Status:   Intimate Partner Violence:   . Fear of Current or Ex-Partner:   . Emotionally Abused:   Marland Kitchen Physically Abused:   . Sexually Abused:      No Known Allergies   Prior to Admission medications   Medication Sig Start Date End Date Taking? Authorizing Provider  amLODipine (NORVASC) 10 MG tablet TAKE 1 TABLET BY MOUTH EVERY DAY  01/14/20  Yes Delia Chimes A, MD  aspirin 81 MG tablet Take 81 mg by mouth daily.   Yes [provider]  cyclobenzaprine (FLEXERIL) 10 MG tablet Take 1 tablet (10 mg total) by mouth 3 (three) times daily as needed for muscle spasms. 05/06/18  Yes Stallings, Zoe A, MD  gabapentin (NEURONTIN) 300 MG capsule TAKE 1 CAPSULE BY MOUTH AT BEDTIME. FOR TINGLING AND NUMBNESS 06/02/19  Yes Stallings, Zoe A, MD  losartan-hydrochlorothiazide (HYZAAR) 100-25 MG tablet TAKE 1 TABLET BY MOUTH EVERY DAY 01/14/20  Yes Stallings, Zoe A, MD  rosuvastatin (CRESTOR) 20 MG tablet TAKE 1 TABLET BY MOUTH EVERY DAY 03/08/20  Yes Stallings, Zoe A, MD  tadalafil (CIALIS) 5 MG tablet TAKE 1 TABLET BY MOUTH DAILY AS NEEDED FOR ERECTILE DYSFUNCTION 12/04/19  Yes Delia Chimes A, MD  meloxicam (MOBIC) 15 MG tablet  Take 1 tablet (15 mg total) by mouth daily. With food Patient not taking: Reported on 03/09/2020 11/21/18   Forrest Moron, MD  testosterone cypionate (DEPOTESTOSTERONE CYPIONATE) 200 MG/ML injection Inject 1 mL (200 mg total) into the muscle every 14 (fourteen) days. Patient not taking: Reported on 03/09/2020 11/21/18 11/21/19  Forrest Moron, MD     Depression screen [facility] 2/9 03/09/2020 10/20/2019 06/23/2019 06/17/2019 11/21/2018  Decreased Interest 0 0 0 0 0  Down, Depressed, Hopeless 0 0 0 0 0  PHQ - 2 Score 0 0 0 0 0     Fall Risk  03/09/2020 10/20/2019 06/17/2019 02/17/2019 11/21/2018  Falls in the past year? 0 0 0 0 0  Number falls in past yr: 0 0 0 - -  Injury with Fall? 0 0 0 0 -  Follow up Falls evaluation completed;Education provided Falls evaluation completed Falls evaluation completed - -      PHYSICAL EXAM: BP 139/80 Comment: taken from previous visit  Ht 5' 5.5" (1.664 m)   Wt 197 lb (89.4 kg)   BMI 32.28 kg/m    Wt Readings from Last 3 Encounters:  03/09/20 197 lb (89.4 kg)  10/20/19 197 lb 9.6 oz (89.6 kg)  06/23/19 195 lb 6.4 oz (88.6 kg)       Education/Counseling provided  regarding diet and exercise, prevention of chronic diseases, smoking/tobacco cessation, if applicable, and reviewed "Covered Medicare Preventive Services."

## 2020-03-20 ENCOUNTER — Other Ambulatory Visit (HOSPITAL_COMMUNITY)
Admission: RE | Admit: 2020-03-20 | Discharge: 2020-03-20 | Disposition: A | Discharge status: Home / Self Care | Payer: Medicare HMO | Source: Ambulatory Visit | Attending: Family Medicine | Admitting: Family Medicine

## 2020-03-20 DIAGNOSIS — Z113 Encounter for screening for infections with a predominantly sexual mode of transmission: Secondary | ICD-10-CM | POA: Insufficient documentation

## 2020-04-19 ENCOUNTER — Encounter: Payer: Self-pay | Admitting: Family Medicine

## 2020-04-19 ENCOUNTER — Ambulatory Visit (INDEPENDENT_AMBULATORY_CARE_PROVIDER_SITE_OTHER): Payer: Medicare HMO | Admitting: Family Medicine

## 2020-04-19 ENCOUNTER — Other Ambulatory Visit: Payer: Self-pay

## 2020-04-19 VITALS — BP 138/76 | HR 60 | Temp 97.9°F | Resp 17 | Ht 65.5 in | Wt 197.0 lb

## 2020-04-19 DIAGNOSIS — M5441 Lumbago with sciatica, right side: Secondary | ICD-10-CM | POA: Diagnosis not present

## 2020-04-19 DIAGNOSIS — M5136 Other intervertebral disc degeneration, lumbar region: Secondary | ICD-10-CM | POA: Diagnosis not present

## 2020-04-19 DIAGNOSIS — G8929 Other chronic pain: Secondary | ICD-10-CM

## 2020-04-19 DIAGNOSIS — M51369 Other intervertebral disc degeneration, lumbar region without mention of lumbar back pain or lower extremity pain: Secondary | ICD-10-CM

## 2020-04-19 DIAGNOSIS — Z113 Encounter for screening for infections with a predominantly sexual mode of transmission: Secondary | ICD-10-CM

## 2020-04-19 DIAGNOSIS — R69 Illness, unspecified: Secondary | ICD-10-CM | POA: Diagnosis not present

## 2020-04-19 DIAGNOSIS — E782 Mixed hyperlipidemia: Secondary | ICD-10-CM

## 2020-04-19 DIAGNOSIS — R7303 Prediabetes: Secondary | ICD-10-CM

## 2020-04-19 DIAGNOSIS — I10 Essential (primary) hypertension: Secondary | ICD-10-CM | POA: Diagnosis not present

## 2020-04-19 DIAGNOSIS — R399 Unspecified symptoms and signs involving the genitourinary system: Secondary | ICD-10-CM

## 2020-04-19 DIAGNOSIS — M5442 Lumbago with sciatica, left side: Secondary | ICD-10-CM

## 2020-04-19 LAB — POCT URINALYSIS DIP (MANUAL ENTRY)
Bilirubin, UA: NEGATIVE
Blood, UA: NEGATIVE
Glucose, UA: NEGATIVE mg/dL
Ketones, POC UA: NEGATIVE mg/dL
Leukocytes, UA: NEGATIVE
Nitrite, UA: NEGATIVE
Protein Ur, POC: NEGATIVE mg/dL
Spec Grav, UA: 1.025 (ref 1.010–1.025)
Urobilinogen, UA: 0.2 E.U./dL
pH, UA: 6.5 (ref 5.0–8.0)

## 2020-04-19 NOTE — Patient Instructions (Addendum)
  CLINICAL DATA:  Lumbar spine pain.  EXAM: LUMBAR SPINE - 2-3 VIEW  COMPARISON:  None.  FINDINGS: Grade I anterolisthesis of L4 on L5 measures 3 mm. This is probably degenerative and facet mediated. No gross pars defects are identified. Vertebral body height preserved. There is mild disc space loss at L4-L5. Other disc spaces appear preserved. No fracture or acute osseous abnormality.  IMPRESSION: L4-L5 predominant lumbar degenerative disease with likely facet mediated grade I anterolisthesis and Mild disc space narrowing.   Electronically Signed   By: Dereck Ligas M.D.   On: 02/24/2014 12:19   If you have lab work done today you will be contacted with your lab results within the next 2 weeks.  If you have not heard from [place] then please contact us. The fastest way to get your results is to register for My Chart.   IF you received an x-ray today, you will receive an invoice from [facility]. Please contact [facility] at [phone] with questions or concerns regarding your invoice.   IF you received labwork today, you will receive an invoice from [facility]. Please contact [facility] at [phone] with questions or concerns regarding your invoice.   Our billing staff will not be able to assist you with questions regarding bills from these companies.  You will be contacted with the lab results as soon as they are available. The fastest way to get your results is to activate your My Chart account. Instructions are located on the last page of this paperwork. If you have not heard from [place] regarding the results in 2 weeks, please contact this office.

## 2020-04-19 NOTE — Progress Notes (Signed)
Established Patient Office Visit  Subjective:  Patient ID: Bryce Rodriguez, male    DOB: Jul 03, 1951  Age: 69 y.o. MRN: [id number]  CC:  Chief Complaint  Patient presents with  . Hypertension and std testing    6 month f/u    HPI Bryce Rodriguez presents for   Hypertension: Patient here for follow-up of elevated blood pressure. He is exercising and is adherent to low salt diet.  Blood pressure is well controlled at home. Cardiac symptoms none. Patient denies chest pain, claudication, fatigue, irregular heart beat, near-syncope and orthopnea.  Cardiovascular risk factors: advanced age (older than 80 for men, 67 for women), hypertension, male gender and obesity (BMI >= 30 kg/m2). Use of agents associated with hypertension: none. History of target organ damage: none. BP Readings from Last 3 Encounters:  04/19/20 138/76  03/09/20 139/80  10/20/19 139/80    Prediabetes Pt reports that he is avoiding sugars, he does not drink sodas He drinks unsweetened tea He is exercising by doing yard work Lab Results  Component Value Date   HGBA1C 5.9 (H) 04/19/2020   Wt Readings from Last 3 Encounters:  04/19/20 197 lb (89.4 kg)  03/09/20 197 lb (89.4 kg)  10/20/19 197 lb 9.6 oz (89.6 kg)   LUTS/?BPH Patient reports that when he gets the URGE to urinate he has to go right then or he might leak.  He states that he has some incontinence.  He states that he has not pain with urination but remembers having prostatitis a few years ago and it was painful. He states that he wakes up 2 -3 TIMES at night to urinate.  He states that he has a weak stream. He also has a history of ED.  Low back pain Pt reports that when  He is active his back pain is not as bad as when he is still or just sitting The pain is across the low back and also down the side of his thighs bilaterally He denies any limping He is active with yard work He has maintained a steady weight  No groin numbness Some lower urinary tract  symptoms    Past Medical History:  Diagnosis Date  . Arthritis 02/24/2014   arms, hands  . Class 1 obesity due to excess calories without serious comorbidity with body mass index (BMI) of 32.0 to 32.9 in adult 06/12/2017  . Environmental allergies 02/06/2017  . HLD (hyperlipidemia)   . HTN (hypertension)   . Hypercholesteremia 01/23/2012  . Mixed hyperlipidemia 06/12/2017  . Numbness and tingling in hands 05/01/2014   04/17/2014. [place].  Dorna Leitz, MD.  Radiographs: These do reveal obvious degenerative disc disease at C5-6. There is no instability noted. Plan: We will proceed with getting surgery set up at patient's earliest convenience.   . Prediabetes 06/12/2017    Past Surgical History:  Procedure Laterality Date  . COLONOSCOPY    . NECK SURGERY  May 19 2014  . none      Family History  Problem Relation Age of Onset  . Stomach cancer Father   . Cancer Other   . Hypertension Other   . Colon cancer Neg Hx   . Esophageal cancer Neg Hx   . Rectal cancer Neg Hx     Social History   Socioeconomic History  . Marital status: Married    Spouse name: Not on file  . Number of children: 2  . Years of education: Not on file  .  Highest education level: Not on file  Occupational History  . Occupation: MANAGEMENT    Employer: [employer]  Tobacco Use  . Smoking status: Never Smoker  . Smokeless tobacco: Current User    Types: Snuff  Substance and Sexual Activity  . Alcohol use: Yes    Alcohol/week: 12.0 standard drinks    Types: 6 Cans of beer, 4 Shots of liquor, 2 Standard drinks or equivalent per week  . Drug use: No  . Sexual activity: Not on file  Other Topics Concern  . Not on file  Social History Narrative  . Not on file   Social Determinants of Health   Financial Resource Strain:   . Difficulty of Paying Living Expenses:   Food Insecurity:   . Worried About Charity fundraiser in the Last Year:   . Academic librarian in the Last Year:   Transportation Needs:   . Film/video editor (Medical):   Marland Kitchen Lack of Transportation (Non-Medical):   Physical Activity:   . Days of Exercise per Week:   . Minutes of Exercise per Session:   Stress:   . Feeling of Stress :   Social Connections:   . Frequency of Communication with Friends and Family:   . Frequency of Social Gatherings with Friends and Family:   . Attends Religious Services:   . Active Member of Clubs or Organizations:   . Attends Archivist Meetings:   Marland Kitchen Marital Status:   Intimate Partner Violence:   . Fear of Current or Ex-Partner:   . Emotionally Abused:   Marland Kitchen Physically Abused:   . Sexually Abused:     Outpatient Medications Prior to Visit  Medication Sig Dispense Refill  . amLODipine (NORVASC) 10 MG tablet TAKE 1 TABLET BY MOUTH EVERY DAY 90 tablet 1  . aspirin 81 MG tablet Take 81 mg by mouth daily.    Marland Kitchen gabapentin (NEURONTIN) 300 MG capsule TAKE 1 CAPSULE BY MOUTH AT BEDTIME. FOR TINGLING AND NUMBNESS 90 capsule 1  . losartan-hydrochlorothiazide (HYZAAR) 100-25 MG tablet TAKE 1 TABLET BY MOUTH EVERY DAY 90 tablet 1  . rosuvastatin (CRESTOR) 20 MG tablet TAKE 1 TABLET BY MOUTH EVERY DAY 90 tablet 0  . tadalafil (CIALIS) 5 MG tablet TAKE 1 TABLET BY MOUTH DAILY AS NEEDED FOR ERECTILE DYSFUNCTION 10 tablet 3  . cyclobenzaprine (FLEXERIL) 10 MG tablet Take 1 tablet (10 mg total) by mouth 3 (three) times daily as needed for muscle spasms. (Patient not taking: Reported on 04/19/2020) 30 tablet 1  . meloxicam (MOBIC) 15 MG tablet Take 1 tablet (15 mg total) by mouth daily. With food (Patient not taking: Reported on 03/09/2020) 90 tablet 3  . testosterone cypionate (DEPOTESTOSTERONE CYPIONATE) 200 MG/ML injection Inject 1 mL (200 mg total) into the muscle every 14 (fourteen) days. (Patient not taking: Reported on 03/09/2020) 2 mL 5   Facility-Administered Medications Prior to Visit  Medication Dose Route Frequency Provider Last Rate  Last Admin  . testosterone cypionate (DEPOTESTOSTERONE CYPIONATE) injection 200 mg  200 mg Intramuscular Q14 Days Nolon Rod, Danyiel Crespin A, MD   200 mg at 09/30/18 1050    No Known Allergies  ROS Review of Systems See hpi   Objective:    Physical Exam  BP 138/76 (BP Location: Right Arm, Patient Position: Sitting, Cuff Size: Large)   Pulse 60   Temp 97.9 F (36.6 C) (Temporal)   Resp 17   Ht 5' 5.5" (1.664 m)   Wt 197 lb (  89.4 kg)   SpO2 98%   BMI 32.28 kg/m  Wt Readings from Last 3 Encounters:  04/19/20 197 lb (89.4 kg)  03/09/20 197 lb (89.4 kg)  10/20/19 197 lb 9.6 oz (89.6 kg)   Physical Exam  Constitutional: Oriented to person, place, and time. Appears well-developed and well-nourished.  HENT:  Head: Normocephalic and atraumatic.  Eyes: Conjunctivae and EOM are normal.  Cardiovascular: Normal rate, regular rhythm, normal heart sounds and intact distal pulses.  No murmur heard. Pulmonary/Chest: Effort normal and breath sounds normal. No stridor. No respiratory distress. Has no wheezes.  Neurological: Is alert and oriented to person, place, and time.  Skin: Skin is warm. Capillary refill takes less than 2 seconds.  Psychiatric: Has a normal mood and affect. Behavior is normal. Judgment and thought content normal.  Lumbar Radiculopathy Exam Back exam: full range of motion, no tenderness, palpable spasm or pain on motion. Straight-leg raise: Negative bilaterally Reflexes:       Right leg: 2+ at knees bilaterally      Left leg: 2+ at knees bilaterally Strength: normal and equal bilaterally  Sensory exam: normal in both lower extremities.  Able to toe walk, heel walk without difficulty or obvious weakness. No obvious pain with hip motion or log rolling of leg.    CLINICAL DATA:  Lumbar spine pain.  EXAM: LUMBAR SPINE - 2-3 VIEW  COMPARISON:  None.  FINDINGS: Grade I anterolisthesis of L4 on L5 measures 3 mm. This is probably degenerative and facet mediated. No  gross pars defects are identified. Vertebral body height preserved. There is mild disc space loss at L4-L5. Other disc spaces appear preserved. No fracture or acute osseous abnormality.  IMPRESSION: L4-L5 predominant lumbar degenerative disease with likely facet mediated grade I anterolisthesis and Mild disc space narrowing.   Electronically Signed   By: Dereck Ligas M.D.   On: 02/24/2014 12:19  Health Maintenance Due  Topic Date Due  . COVID-19 Vaccine (1) Never done  . TETANUS/TDAP  Never done    There are no preventive care reminders to display for this patient.  Lab Results  Component Value Date   TSH 1.050 03/14/2018   Lab Results  Component Value Date   WBC 5.6 02/17/2019   HGB 13.8 02/17/2019   HCT 39.9 02/17/2019   MCV 84 02/17/2019   PLT 233 02/17/2019   Lab Results  Component Value Date   NA 143 04/19/2020   K 3.4 (L) 04/19/2020   CO2 22 04/19/2020   GLUCOSE 98 04/19/2020   BUN 17 04/19/2020   CREATININE 1.04 04/19/2020   BILITOT 0.5 04/19/2020   ALKPHOS 57 04/19/2020   AST 32 04/19/2020   ALT 49 (H) 04/19/2020   PROT 7.0 04/19/2020   ALBUMIN 4.7 04/19/2020   CALCIUM 9.4 04/19/2020   Lab Results  Component Value Date   CHOL 171 04/19/2020   Lab Results  Component Value Date   HDL 56 04/19/2020   Lab Results  Component Value Date   LDLCALC 97 04/19/2020   Lab Results  Component Value Date   TRIG 99 04/19/2020   Lab Results  Component Value Date   CHOLHDL 3.1 04/19/2020   Lab Results  Component Value Date   HGBA1C 5.9 (H) 04/19/2020      Assessment & Plan:   Problem List Items Addressed This Visit      Cardiovascular and Mediastinum   HTN (hypertension)  - Patient's blood pressure is at goal of 139/89 or less. Condition  is stable. Continue current medications and treatment plan. I recommend that you exercise for 30-45 minutes 5 days a week. I also recommend a balanced diet with fruits and vegetables every day, lean  meats, and little fried foods. The DASH diet (you can find this online) is a good example of this.    Relevant Orders   Comprehensive metabolic panel (Completed)     Other   Mixed hyperlipidemia - Discussed medications that affect lipids Reminded patient to avoid grapefruits Reviewed last 3 lipids Discussed current meds: statin, aspirin Advised dietary fiber and fish oil and ways to keep HDL high CAD prevention and reviewed side effects of statins   Relevant Orders   Comprehensive metabolic panel (Completed)   Lipid panel (Completed)   Prediabetes - discussed diabetes prevention   Relevant Orders   Comprehensive metabolic panel (Completed)   Hemoglobin A1c (Completed)    Other Visit Diagnoses    Screening for STDs (sexually transmitted diseases)    -  Primary   Relevant Orders   Urine cytology ancillary only   HIV Antibody (routine testing w rflx) (Completed)   Hepatitis B surface antigen (Completed)   RPR (Completed)   Lower urinary tract symptoms (LUTS)    -  Continue with Urology   Relevant Orders   Ambulatory referral to Urology   POCT urinalysis dipstick (Completed)   Lumbar degenerative disc disease       Relevant Orders   Ambulatory referral to Orthopedic Surgery   Chronic bilateral low back pain with bilateral sciatica    - follow up with Orthopedics for continued degenerative disease of the spine   Relevant Orders   Ambulatory referral to Orthopedic Surgery      No orders of the defined types were placed in this encounter.   Follow-up: No follow-ups on file.    Forrest Moron, MD

## 2020-04-20 LAB — COMPREHENSIVE METABOLIC PANEL
ALT: 49 IU/L — ABNORMAL HIGH (ref 0–44)
AST: 32 IU/L (ref 0–40)
Albumin/Globulin Ratio: 2 (ref 1.2–2.2)
Albumin: 4.7 g/dL (ref 3.8–4.8)
Alkaline Phosphatase: 57 IU/L (ref 39–117)
BUN/Creatinine Ratio: 16 (ref 10–24)
BUN: 17 mg/dL (ref 8–27)
Bilirubin Total: 0.5 mg/dL (ref 0.0–1.2)
CO2: 22 mmol/L (ref 20–29)
Calcium: 9.4 mg/dL (ref 8.6–10.2)
Chloride: 103 mmol/L (ref 96–106)
Creatinine, Ser: 1.04 mg/dL (ref 0.76–1.27)
GFR calc Af Amer: 85 mL/min/{1.73_m2} (ref >59)
GFR calc non Af Amer: 74 mL/min/{1.73_m2} (ref >59)
Globulin, Total: 2.3 g/dL (ref 1.5–4.5)
Glucose: 98 mg/dL (ref 65–99)
Potassium: 3.4 mmol/L — ABNORMAL LOW (ref 3.5–5.2)
Sodium: 143 mmol/L (ref 134–144)
Total Protein: 7 g/dL (ref 6.0–8.5)

## 2020-04-20 LAB — LIPID PANEL
Chol/HDL Ratio: 3.1 ratio (ref 0.0–5.0)
Cholesterol, Total: 171 mg/dL (ref 100–199)
HDL: 56 mg/dL (ref >39)
LDL Chol Calc (NIH): 97 mg/dL (ref 0–99)
Triglycerides: 99 mg/dL (ref 0–149)
VLDL Cholesterol Cal: 18 mg/dL (ref 5–40)

## 2020-04-20 LAB — RPR: RPR Ser Ql: NONREACTIVE

## 2020-04-20 LAB — HEMOGLOBIN A1C
Est. average glucose Bld gHb Est-mCnc: 123 mg/dL
Hgb A1c MFr Bld: 5.9 % — ABNORMAL HIGH (ref 4.8–5.6)

## 2020-04-20 LAB — HEPATITIS B SURFACE ANTIGEN: Hepatitis B Surface Ag: NEGATIVE

## 2020-04-20 LAB — URINE CYTOLOGY ANCILLARY ONLY
Chlamydia: NEGATIVE
Comment: NEGATIVE
Comment: NORMAL
Neisseria Gonorrhea: NEGATIVE

## 2020-04-20 LAB — HIV ANTIBODY (ROUTINE TESTING W REFLEX): HIV Screen 4th Generation wRfx: NONREACTIVE

## 2020-05-10 DIAGNOSIS — M4316 Spondylolisthesis, lumbar region: Secondary | ICD-10-CM | POA: Diagnosis not present

## 2020-05-10 DIAGNOSIS — M545 Low back pain: Secondary | ICD-10-CM | POA: Diagnosis not present

## 2020-05-11 DIAGNOSIS — Z8249 Family history of ischemic heart disease and other diseases of the circulatory system: Secondary | ICD-10-CM | POA: Diagnosis not present

## 2020-05-11 DIAGNOSIS — Z6832 Body mass index (BMI) 32.0-32.9, adult: Secondary | ICD-10-CM | POA: Diagnosis not present

## 2020-05-11 DIAGNOSIS — Z008 Encounter for other general examination: Secondary | ICD-10-CM | POA: Diagnosis not present

## 2020-05-11 DIAGNOSIS — G629 Polyneuropathy, unspecified: Secondary | ICD-10-CM | POA: Diagnosis not present

## 2020-05-11 DIAGNOSIS — Z87891 Personal history of nicotine dependence: Secondary | ICD-10-CM | POA: Diagnosis not present

## 2020-05-11 DIAGNOSIS — R69 Illness, unspecified: Secondary | ICD-10-CM | POA: Diagnosis not present

## 2020-05-11 DIAGNOSIS — M1990 Unspecified osteoarthritis, unspecified site: Secondary | ICD-10-CM | POA: Diagnosis not present

## 2020-05-11 DIAGNOSIS — E785 Hyperlipidemia, unspecified: Secondary | ICD-10-CM | POA: Diagnosis not present

## 2020-05-11 DIAGNOSIS — Z7982 Long term (current) use of aspirin: Secondary | ICD-10-CM | POA: Diagnosis not present

## 2020-05-11 DIAGNOSIS — I10 Essential (primary) hypertension: Secondary | ICD-10-CM | POA: Diagnosis not present

## 2020-05-11 DIAGNOSIS — E669 Obesity, unspecified: Secondary | ICD-10-CM | POA: Diagnosis not present

## 2020-05-27 DIAGNOSIS — M5136 Other intervertebral disc degeneration, lumbar region: Secondary | ICD-10-CM | POA: Diagnosis not present

## 2020-05-31 DIAGNOSIS — M545 Low back pain: Secondary | ICD-10-CM | POA: Diagnosis not present

## 2020-06-01 ENCOUNTER — Other Ambulatory Visit: Payer: Self-pay

## 2020-06-01 DIAGNOSIS — E785 Hyperlipidemia, unspecified: Secondary | ICD-10-CM

## 2020-06-01 MED ORDER — ROSUVASTATIN CALCIUM 20 MG PO TABS: 20.0000 mg | ORAL_TABLET | Freq: Every day | ORAL | 0 refills | Status: DC

## 2020-06-08 DIAGNOSIS — M545 Low back pain: Secondary | ICD-10-CM | POA: Diagnosis not present

## 2020-06-17 DIAGNOSIS — M545 Low back pain: Secondary | ICD-10-CM | POA: Diagnosis not present

## 2020-06-22 DIAGNOSIS — M545 Low back pain: Secondary | ICD-10-CM | POA: Diagnosis not present

## 2020-06-24 DIAGNOSIS — M545 Low back pain: Secondary | ICD-10-CM | POA: Diagnosis not present

## 2020-08-09 DIAGNOSIS — R310 Gross hematuria: Secondary | ICD-10-CM | POA: Diagnosis not present

## 2020-08-09 DIAGNOSIS — N400 Enlarged prostate without lower urinary tract symptoms: Secondary | ICD-10-CM | POA: Diagnosis not present

## 2020-09-17 ENCOUNTER — Encounter: Payer: Self-pay | Admitting: Family Medicine

## 2020-09-17 ENCOUNTER — Ambulatory Visit (INDEPENDENT_AMBULATORY_CARE_PROVIDER_SITE_OTHER): Payer: Medicare HMO | Admitting: Family Medicine

## 2020-09-17 ENCOUNTER — Other Ambulatory Visit: Payer: Self-pay

## 2020-09-17 VITALS — BP 131/84 | HR 76 | Temp 97.6°F | Ht 65.5 in | Wt 195.4 lb

## 2020-09-17 DIAGNOSIS — M5432 Sciatica, left side: Secondary | ICD-10-CM | POA: Diagnosis not present

## 2020-09-17 DIAGNOSIS — Z23 Encounter for immunization: Secondary | ICD-10-CM

## 2020-09-17 DIAGNOSIS — M79605 Pain in left leg: Secondary | ICD-10-CM

## 2020-09-17 MED ORDER — CYCLOBENZAPRINE HCL 10 MG PO TABS: 10.0000 mg | ORAL_TABLET | Freq: Three times a day (TID) | ORAL | 1 refills | Status: DC | PRN

## 2020-09-17 MED ORDER — PREDNISONE 20 MG PO TABS: ORAL_TABLET | ORAL | 0 refills | Status: DC

## 2020-09-17 MED ORDER — DICLOFENAC SODIUM 75 MG PO TBEC: 75.0000 mg | DELAYED_RELEASE_TABLET | Freq: Two times a day (BID) | ORAL | 1 refills | Status: DC

## 2020-09-17 MED ORDER — HYDROCODONE-ACETAMINOPHEN 5-325 MG PO TABS: 1.0000 | ORAL_TABLET | Freq: Four times a day (QID) | ORAL | 0 refills | Status: AC | PRN

## 2020-09-17 NOTE — Progress Notes (Signed)
Patient ID: Bryce Rodriguez, male    DOB: 1951/11/21  Age: 69 y.o. MRN: [id number]  Chief Complaint  Patient presents with  . Back Pain    Pt stated that he has been experiencing some low back pain and Lt hip pain that runs down his leg from stepping in a hole x1wk ago. The pain ia to the point where he can't sleep in the night.    Subjective:   Patient stepped in a hole while working in his yard.  He had pain acutely in the left buttock and left lateral leg and hip area.  This is continued to persist intermittently.  When he rolls on that side it is intractable pain.  He has not been able to sleep at all the last few nights because of the pain.  Has not had prior pain in that area or cyst or similar symptoms.  He still does a lot of volunteer work and stays quite active.  Current allergies, medications, problem list, past/family and social histories reviewed.  Objective:  BP 131/84 (BP Location: Right Arm, Patient Position: Sitting, Cuff Size: Large)   Pulse 76   Temp 97.6 F (36.4 C) (Temporal)   Ht 5' 5.5" (1.664 m)   Wt 195 lb 6.4 oz (88.6 kg)   SpO2 97%   BMI 32.02 kg/m   No acute distress this time.  Abdomen soft without mass or tenderness.  Straight leg raising negative.  No CVA or spinous tenderness.  He is tender in the left buttock upper outer quadrant.  Tenderness extends down just behind the tibial area.  Pain goes down to his knee.  Feet normal. Assessment & Plan:   Assessment: 1. Left sided sciatica   2. Leg pain, lateral, left   3. Need for prophylactic vaccination and inoculation against influenza       Plan: See instructions.  No x-rays today but might be needed if he persists with your sprain.  Orders Placed This Encounter  Procedures  . Flu Vaccine QUAD High Dose(Fluad)    Meds ordered this encounter  Medications  . predniSONE (DELTASONE) 20 MG tablet    Sig: Take 3 daily for 2 days, then 2 daily for 2 days, then 1 daily for 2 days.  Best taken in the  morning after breakfast.    Dispense:  12 tablet    Refill:  0  . cyclobenzaprine (FLEXERIL) 10 MG tablet    Sig: Take 1 tablet (10 mg total) by mouth 3 (three) times daily as needed for muscle spasms.    Dispense:  30 tablet    Refill:  1  . diclofenac (VOLTAREN) 75 MG EC tablet    Sig: Take 1 tablet (75 mg total) by mouth 2 (two) times daily.    Dispense:  30 tablet    Refill:  1  . HYDROcodone-acetaminophen (NORCO) 5-325 MG tablet    Sig: Take 1 tablet by mouth every 6 (six) hours as needed.    Dispense:  20 tablet    Refill:  0         Patient Instructions    Take prednisone 20 mg 3 daily for 2 days, then 2 daily for 2 days, then 1 daily for 2 days.  This is a strong anti-inflammatory medicine to calm down the nerve inflammation and strain.  It is best taken in the morning after breakfast, but take the first dose today when you get the medicine, take with some food.  Take diclofenac 75  mg 1 twice daily for pain and inflammation.  (Do not take ibuprofen or Aleve while you are taking this.)  Take Norco (hydrocodone) 5 mg 1 every 6 hours if needed for severe pain  Take the cyclobenzaprine muscle relaxant as needed for tight muscles in the leg.  This will also help you sleep.  Return if worse or not improving.    Try to minimize anything that might strain the leg.       If you have lab work done today you will be contacted with your lab results within the next 2 weeks.  If you have not heard from [place] then please contact us. The fastest way to get your results is to register for My Chart.   IF you received an x-ray today, you will receive an invoice from [facility]. Please contact [facility] at [phone] with questions or concerns regarding your invoice.   IF you received labwork today, you will receive an invoice from [place]. Please contact [facility] at [phone] with questions or concerns regarding your invoice.   Our billing staff will not  be able to assist you with questions regarding bills from these companies.  You will be contacted with the lab results as soon as they are available. The fastest way to get your results is to activate your My Chart account. Instructions are located on the last page of this paperwork. If you have not heard from [place] regarding the results in 2 weeks, please contact this office.        Return if symptoms worsen or fail to improve.   Bryce Reason, MD 09/17/2020

## 2020-09-17 NOTE — Patient Instructions (Addendum)
  Take prednisone 20 mg 3 daily for 2 days, then 2 daily for 2 days, then 1 daily for 2 days.  This is a strong anti-inflammatory medicine to calm down the nerve inflammation and strain.  It is best taken in the morning after breakfast, but take the first dose today when you get the medicine, take with some food.  Take diclofenac 75 mg 1 twice daily for pain and inflammation.  (Do not take ibuprofen or Aleve while you are taking this.)  Take Norco (hydrocodone) 5 mg 1 every 6 hours if needed for severe pain  Take the cyclobenzaprine muscle relaxant as needed for tight muscles in the leg.  This will also help you sleep.  Return if worse or not improving.    Try to minimize anything that might strain the leg.       If you have lab work done today you will be contacted with your lab results within the next 2 weeks.  If you have not heard from [place] then please contact us. The fastest way to get your results is to register for My Chart.   IF you received an x-ray today, you will receive an invoice from [facility]. Please contact [facility] at [phone] with questions or concerns regarding your invoice.   IF you received labwork today, you will receive an invoice from [place]. Please contact [facility] at [phone] with questions or concerns regarding your invoice.   Our billing staff will not be able to assist you with questions regarding bills from these companies.  You will be contacted with the lab results as soon as they are available. The fastest way to get your results is to activate your My Chart account. Instructions are located on the last page of this paperwork. If you have not heard from [place] regarding the results in 2 weeks, please contact this office.

## 2020-09-23 ENCOUNTER — Encounter: Payer: Self-pay | Admitting: *Deleted

## 2020-10-21 ENCOUNTER — Other Ambulatory Visit: Payer: Self-pay | Admitting: Family Medicine

## 2020-10-21 DIAGNOSIS — M5432 Sciatica, left side: Secondary | ICD-10-CM

## 2020-10-21 DIAGNOSIS — M79605 Pain in left leg: Secondary | ICD-10-CM

## 2020-10-21 NOTE — Telephone Encounter (Signed)
   Notes to clinic Is this pt associated with your practice, no PCP listed, Dr. Linna Darner did write this rx.

## 2020-10-22 NOTE — Telephone Encounter (Signed)
Patient is requesting a refill of the following medications: Requested Prescriptions   Pending Prescriptions Disp Refills   diclofenac (VOLTAREN) 75 MG EC tablet [Pharmacy Med Name: DICLOFENAC SOD EC 75 MG TAB] 30 tablet 1    Sig: TAKE 1 TABLET BY MOUTH TWICE A DAY    Date of patient request: 10/21/2020 Last office visit: 09/17/2020 Date of last refill: 09/17/2020 Last refill amount: 30 tablets Follow up time period per chart: PRN / worsening not better

## 2020-10-27 DIAGNOSIS — I10 Essential (primary) hypertension: Secondary | ICD-10-CM | POA: Diagnosis not present

## 2020-10-27 DIAGNOSIS — N401 Enlarged prostate with lower urinary tract symptoms: Secondary | ICD-10-CM | POA: Diagnosis not present

## 2020-10-27 DIAGNOSIS — Z79899 Other long term (current) drug therapy: Secondary | ICD-10-CM | POA: Diagnosis not present

## 2020-11-01 DIAGNOSIS — N401 Enlarged prostate with lower urinary tract symptoms: Secondary | ICD-10-CM | POA: Diagnosis not present

## 2020-11-01 DIAGNOSIS — I10 Essential (primary) hypertension: Secondary | ICD-10-CM | POA: Diagnosis not present

## 2020-11-01 DIAGNOSIS — R69 Illness, unspecified: Secondary | ICD-10-CM | POA: Diagnosis not present

## 2021-01-27 ENCOUNTER — Encounter: Payer: Self-pay | Admitting: Family Medicine

## 2021-01-27 ENCOUNTER — Ambulatory Visit (INDEPENDENT_AMBULATORY_CARE_PROVIDER_SITE_OTHER): Payer: Medicare HMO | Admitting: Family Medicine

## 2021-01-27 ENCOUNTER — Other Ambulatory Visit: Payer: Self-pay

## 2021-01-27 ENCOUNTER — Other Ambulatory Visit: Payer: Self-pay | Admitting: Family Medicine

## 2021-01-27 VITALS — BP 129/78 | HR 80 | Temp 98.7°F | Ht 65.5 in | Wt 201.0 lb

## 2021-01-27 DIAGNOSIS — M542 Cervicalgia: Secondary | ICD-10-CM | POA: Diagnosis not present

## 2021-01-27 DIAGNOSIS — M5432 Sciatica, left side: Secondary | ICD-10-CM

## 2021-01-27 DIAGNOSIS — G4486 Cervicogenic headache: Secondary | ICD-10-CM

## 2021-01-27 DIAGNOSIS — M79605 Pain in left leg: Secondary | ICD-10-CM

## 2021-01-27 MED ORDER — DICLOFENAC SODIUM 75 MG PO TBEC: 75.0000 mg | DELAYED_RELEASE_TABLET | Freq: Two times a day (BID) | ORAL | 1 refills | Status: DC

## 2021-01-27 MED ORDER — METAXALONE 800 MG PO TABS: ORAL_TABLET | ORAL | 1 refills | Status: DC

## 2021-01-27 NOTE — Progress Notes (Signed)
Patient ID: JAHEL WAVRA, male    DOB: [birth year]  Age: 70 y.o. MRN: [id number]  Chief Complaint  Patient presents with  . Neck Pain    Neck pain for 3-4 weeks that now given me a headache that will not go away. Right side of head and neck    Subjective:   Patient has been having a lot of trouble in the last month of pain in the left side of his neck.  He had cervical disc surgery a number of years ago.  He hurts mostly in low neck to mid neck but now it started giving him headaches on the left occipital region.  Is not hurting every day but most days it does.  He was treated for back pain late last year by me, but he cannot take the Flexeril which overly sedates him.  No pain rating down into the arm.  He still has a back pain radiating down into the left thigh and leg.  Current allergies, medications, problem list, past/family and social histories reviewed.  Objective:  BP 129/78 (BP Location: Right Arm, Patient Position: Sitting, Cuff Size: Large)   Pulse 80   Temp 98.7 F (37.1 C) (Temporal)   Ht 5' 5.5" (1.664 m)   Wt 201 lb (91.2 kg)   SpO2 97%   BMI 32.94 kg/m   Fair range of motion of the neck but he has trouble turning to the left as much.  He is tender along the left side of his neck, especially down around the C8 or so level but some tenderness all the way down.  Range of motion as mentioned is fairly satisfactory.  Straight leg raising is negative but he has pain in the left thigh.  Assessment & Plan:   Assessment: 1. Neck pain on left side   2. Leg pain, lateral, left   3. Left sided sciatica   4. Cervicogenic headache       Plan: See instructions.  Check blood chemistries to make sure his renal function is good for taking the diclofenac.  He needs a nonsedating muscle relaxant.  Orders Placed This Encounter  Procedures  . Basic metabolic panel    Meds ordered this encounter  Medications  . metaxalone (SKELAXIN) 800 MG tablet    Sig: Take one every 8-12  hours as needed for muscle relaxant    Dispense:  60 tablet    Refill:  1  . diclofenac (VOLTAREN) 75 MG EC tablet    Sig: Take 1 tablet (75 mg total) by mouth 2 (two) times daily.    Dispense:  30 tablet    Refill:  1         Patient Instructions     Take metaxalone (Skelaxin) 800 mg 1/2 to 1 pill every 8-12 hours as needed for muscle relaxant  Take diclofenac 1 twice daily on a regular basis for about 2 weeks, then as needed for neck and back pain problems.  If you keep having troubles go back to see your doctor at [facility] or call and ask for a referral to the neurosurgeon office to evaluate you for the neck problems.  You will need to line up a new primary care doctor since this office is closing.  [facility] should be sending you a letter in the next couple of weeks regarding this.  If you have lab work done today you will be contacted with your lab results within the next 2 weeks.  If you have not  heard from [place] then please contact us. The fastest way to get your results is to register for My Chart.   IF you received an x-ray today, you will receive an invoice from [facility]. Please contact [facility] at [phone] with questions or concerns regarding your invoice.   IF you received labwork today, you will receive an invoice from [place]. Please contact [facility] at [phone] with questions or concerns regarding your invoice.   Our billing staff will not be able to assist you with questions regarding bills from these companies.  You will be contacted with the lab results as soon as they are available. The fastest way to get your results is to activate your My Chart account. Instructions are located on the last page of this paperwork. If you have not heard from [place] regarding the results in 2 weeks, please contact this office.         Return if symptoms worsen or fail to improve.   Ruben Reason, MD 01/27/2021

## 2021-01-27 NOTE — Patient Instructions (Addendum)
   Take metaxalone (Skelaxin) 800 mg 1/2 to 1 pill every 8-12 hours as needed for muscle relaxant  Take diclofenac 1 twice daily on a regular basis for about 2 weeks, then as needed for neck and back pain problems.  If you keep having troubles go back to see your doctor at [facility] or call and ask for a referral to the neurosurgeon office to evaluate you for the neck problems.  You will need to line up a new primary care doctor since this office is closing.  [facility] should be sending you a letter in the next couple of weeks regarding this.  If you have lab work done today you will be contacted with your lab results within the next 2 weeks.  If you have not heard from [place] then please contact us. The fastest way to get your results is to register for My Chart.   IF you received an x-ray today, you will receive an invoice from [facility]. Please contact [facility] at [phone] with questions or concerns regarding your invoice.   IF you received labwork today, you will receive an invoice from [place]. Please contact [facility] at [phone] with questions or concerns regarding your invoice.   Our billing staff will not be able to assist you with questions regarding bills from these companies.  You will be contacted with the lab results as soon as they are available. The fastest way to get your results is to activate your My Chart account. Instructions are located on the last page of this paperwork. If you have not heard from [place] regarding the results in 2 weeks, please contact this office.

## 2021-01-27 NOTE — Telephone Encounter (Signed)
   Last visit yes  Future visit scheduled no  Notes to clinic Not Delegated.

## 2021-01-28 ENCOUNTER — Other Ambulatory Visit: Payer: Self-pay | Admitting: Family Medicine

## 2021-01-28 DIAGNOSIS — E876 Hypokalemia: Secondary | ICD-10-CM

## 2021-01-28 LAB — BASIC METABOLIC PANEL
BUN/Creatinine Ratio: 18 (ref 10–24)
BUN: 20 mg/dL (ref 8–27)
CO2: 26 mmol/L (ref 20–29)
Calcium: 10 mg/dL (ref 8.6–10.2)
Chloride: 98 mmol/L (ref 96–106)
Creatinine, Ser: 1.13 mg/dL (ref 0.76–1.27)
GFR calc Af Amer: 77 mL/min/{1.73_m2} (ref >59)
GFR calc non Af Amer: 66 mL/min/{1.73_m2} (ref >59)
Glucose: 99 mg/dL (ref 65–99)
Potassium: 3.2 mmol/L — ABNORMAL LOW (ref 3.5–5.2)
Sodium: 144 mmol/L (ref 134–144)

## 2021-01-28 MED ORDER — POTASSIUM CHLORIDE ER 10 MEQ PO TBCR: 10.0000 meq | EXTENDED_RELEASE_TABLET | Freq: Every day | ORAL | 3 refills | Status: DC

## 2021-01-28 NOTE — Telephone Encounter (Signed)
Please send in one of the alternative medications since the one sent was not covered.

## 2021-01-31 ENCOUNTER — Other Ambulatory Visit: Payer: Self-pay | Admitting: Family Medicine

## 2021-01-31 ENCOUNTER — Telehealth: Payer: Self-pay

## 2021-01-31 NOTE — Telephone Encounter (Signed)
It looks like Dr. Linna Darner placed an order for methocarbamol and discontinued the skelaxin.

## 2021-01-31 NOTE — Telephone Encounter (Signed)
Pt saw dr Linna Darner on 01/27/2021 and prescribed Skelaxin 800 mg but this is not covered by pt insurance would you please send Tizanidine, cyclobenzaprine, or chlorzoxazone as these are preferred.   Thank you

## 2021-02-03 NOTE — Telephone Encounter (Signed)
Then alternative has already been sent message can be disregarded

## 2021-03-25 ENCOUNTER — Other Ambulatory Visit: Payer: Self-pay | Admitting: Family Medicine

## 2021-03-25 DIAGNOSIS — M79605 Pain in left leg: Secondary | ICD-10-CM

## 2021-03-25 DIAGNOSIS — E876 Hypokalemia: Secondary | ICD-10-CM

## 2021-03-25 DIAGNOSIS — M542 Cervicalgia: Secondary | ICD-10-CM

## 2021-03-25 DIAGNOSIS — G4486 Cervicogenic headache: Secondary | ICD-10-CM

## 2021-03-25 DIAGNOSIS — M5432 Sciatica, left side: Secondary | ICD-10-CM

## 2021-04-15 ENCOUNTER — Ambulatory Visit: Payer: Medicare HMO | Attending: Internal Medicine

## 2021-04-15 ENCOUNTER — Other Ambulatory Visit: Payer: Self-pay

## 2021-04-15 ENCOUNTER — Other Ambulatory Visit (HOSPITAL_BASED_OUTPATIENT_CLINIC_OR_DEPARTMENT_OTHER): Payer: Self-pay

## 2021-04-15 DIAGNOSIS — Z23 Encounter for immunization: Secondary | ICD-10-CM

## 2021-04-15 MED ORDER — PFIZER-BIONT COVID-19 VAC-TRIS 30 MCG/0.3ML IM SUSP
INTRAMUSCULAR | 0 refills | Status: DC
  Filled 2021-04-15: qty 0.3, 1d supply, fill #0

## 2021-04-15 NOTE — Progress Notes (Signed)
   Covid-19 Vaccination Clinic  Name:  CHRISTOHER DRUDGE    MRN: [id number] DOB: 04/13/1951  04/15/2021  Mr. Ridley was observed post Covid-19 immunization for 15 minutes without incident. He was provided with Vaccine Information Sheet and instruction to access the V-Safe system.   Mr. Funes was instructed to call 911 with any severe reactions post vaccine: Marland Kitchen Difficulty breathing  . Swelling of face and throat  . A fast heartbeat  . A bad rash all over body  . Dizziness and weakness   Immunizations Administered    Name Date Dose VIS Date Route   PFIZER Comrnaty(Gray TOP) Covid-19 Vaccine 04/15/2021  1:23 PM 0.3 mL 11/25/2020 Intramuscular   Manufacturer: [employer]   Lot: [id number]   NDC: [phone]

## 2021-05-22 ENCOUNTER — Other Ambulatory Visit: Payer: Self-pay | Admitting: Family Medicine

## 2021-05-22 DIAGNOSIS — E876 Hypokalemia: Secondary | ICD-10-CM

## 2021-08-15 DIAGNOSIS — M4316 Spondylolisthesis, lumbar region: Secondary | ICD-10-CM | POA: Diagnosis not present

## 2021-09-02 DIAGNOSIS — Z7722 Contact with and (suspected) exposure to environmental tobacco smoke (acute) (chronic): Secondary | ICD-10-CM | POA: Diagnosis not present

## 2021-09-02 DIAGNOSIS — G8929 Other chronic pain: Secondary | ICD-10-CM | POA: Diagnosis not present

## 2021-09-02 DIAGNOSIS — M1990 Unspecified osteoarthritis, unspecified site: Secondary | ICD-10-CM | POA: Diagnosis not present

## 2021-09-02 DIAGNOSIS — E669 Obesity, unspecified: Secondary | ICD-10-CM | POA: Diagnosis not present

## 2021-09-02 DIAGNOSIS — E785 Hyperlipidemia, unspecified: Secondary | ICD-10-CM | POA: Diagnosis not present

## 2021-09-02 DIAGNOSIS — I10 Essential (primary) hypertension: Secondary | ICD-10-CM | POA: Diagnosis not present

## 2021-09-02 DIAGNOSIS — Z6831 Body mass index (BMI) 31.0-31.9, adult: Secondary | ICD-10-CM | POA: Diagnosis not present

## 2021-09-02 DIAGNOSIS — Z803 Family history of malignant neoplasm of breast: Secondary | ICD-10-CM | POA: Diagnosis not present

## 2021-09-02 DIAGNOSIS — Z7982 Long term (current) use of aspirin: Secondary | ICD-10-CM | POA: Diagnosis not present

## 2021-09-20 DIAGNOSIS — M5416 Radiculopathy, lumbar region: Secondary | ICD-10-CM | POA: Diagnosis not present

## 2021-09-26 ENCOUNTER — Other Ambulatory Visit (HOSPITAL_BASED_OUTPATIENT_CLINIC_OR_DEPARTMENT_OTHER): Payer: Self-pay

## 2021-11-08 DIAGNOSIS — Z125 Encounter for screening for malignant neoplasm of prostate: Secondary | ICD-10-CM | POA: Diagnosis not present

## 2021-11-08 DIAGNOSIS — Z79899 Other long term (current) drug therapy: Secondary | ICD-10-CM | POA: Diagnosis not present

## 2021-11-08 DIAGNOSIS — R7303 Prediabetes: Secondary | ICD-10-CM | POA: Diagnosis not present

## 2021-11-08 DIAGNOSIS — I509 Heart failure, unspecified: Secondary | ICD-10-CM | POA: Diagnosis not present

## 2021-11-08 DIAGNOSIS — N400 Enlarged prostate without lower urinary tract symptoms: Secondary | ICD-10-CM | POA: Diagnosis not present

## 2021-11-08 DIAGNOSIS — E559 Vitamin D deficiency, unspecified: Secondary | ICD-10-CM | POA: Diagnosis not present

## 2021-11-08 DIAGNOSIS — E669 Obesity, unspecified: Secondary | ICD-10-CM | POA: Diagnosis not present

## 2021-11-08 DIAGNOSIS — E876 Hypokalemia: Secondary | ICD-10-CM | POA: Diagnosis not present

## 2021-11-08 DIAGNOSIS — E785 Hyperlipidemia, unspecified: Secondary | ICD-10-CM | POA: Diagnosis not present

## 2021-11-08 DIAGNOSIS — I110 Hypertensive heart disease with heart failure: Secondary | ICD-10-CM | POA: Diagnosis not present

## 2021-12-01 DIAGNOSIS — E559 Vitamin D deficiency, unspecified: Secondary | ICD-10-CM | POA: Diagnosis not present

## 2021-12-01 DIAGNOSIS — E785 Hyperlipidemia, unspecified: Secondary | ICD-10-CM | POA: Diagnosis not present

## 2021-12-01 DIAGNOSIS — Z0001 Encounter for general adult medical examination with abnormal findings: Secondary | ICD-10-CM | POA: Diagnosis not present

## 2021-12-01 DIAGNOSIS — E876 Hypokalemia: Secondary | ICD-10-CM | POA: Diagnosis not present

## 2021-12-01 DIAGNOSIS — I509 Heart failure, unspecified: Secondary | ICD-10-CM | POA: Diagnosis not present

## 2021-12-01 DIAGNOSIS — E669 Obesity, unspecified: Secondary | ICD-10-CM | POA: Diagnosis not present

## 2021-12-01 DIAGNOSIS — I110 Hypertensive heart disease with heart failure: Secondary | ICD-10-CM | POA: Diagnosis not present

## 2021-12-01 DIAGNOSIS — Z6832 Body mass index (BMI) 32.0-32.9, adult: Secondary | ICD-10-CM | POA: Diagnosis not present

## 2021-12-01 DIAGNOSIS — Z789 Other specified health status: Secondary | ICD-10-CM | POA: Diagnosis not present

## 2021-12-01 DIAGNOSIS — Z7189 Other specified counseling: Secondary | ICD-10-CM | POA: Diagnosis not present

## 2021-12-08 DIAGNOSIS — E876 Hypokalemia: Secondary | ICD-10-CM | POA: Diagnosis not present

## 2022-11-05 ENCOUNTER — Emergency Department (HOSPITAL_COMMUNITY)
Admission: EM | Admit: 2022-11-05 | Discharge: 2022-11-05 | Disposition: A | Discharge status: Home / Self Care | Payer: Medicare HMO | Attending: Emergency Medicine | Admitting: Emergency Medicine

## 2022-11-05 ENCOUNTER — Encounter (HOSPITAL_COMMUNITY): Payer: Self-pay

## 2022-11-05 ENCOUNTER — Other Ambulatory Visit: Payer: Self-pay

## 2022-11-05 ENCOUNTER — Emergency Department (HOSPITAL_COMMUNITY): Payer: Medicare HMO

## 2022-11-05 DIAGNOSIS — M7918 Myalgia, other site: Secondary | ICD-10-CM

## 2022-11-05 DIAGNOSIS — M549 Dorsalgia, unspecified: Secondary | ICD-10-CM | POA: Diagnosis not present

## 2022-11-05 DIAGNOSIS — I10 Essential (primary) hypertension: Secondary | ICD-10-CM | POA: Diagnosis not present

## 2022-11-05 DIAGNOSIS — E876 Hypokalemia: Secondary | ICD-10-CM | POA: Insufficient documentation

## 2022-11-05 DIAGNOSIS — Z7982 Long term (current) use of aspirin: Secondary | ICD-10-CM | POA: Diagnosis not present

## 2022-11-05 DIAGNOSIS — M25512 Pain in left shoulder: Secondary | ICD-10-CM | POA: Diagnosis not present

## 2022-11-05 DIAGNOSIS — Z79899 Other long term (current) drug therapy: Secondary | ICD-10-CM | POA: Insufficient documentation

## 2022-11-05 LAB — BASIC METABOLIC PANEL
Anion gap: 12 (ref 5–15)
BUN: 12 mg/dL (ref 8–23)
CO2: 24 mmol/L (ref 22–32)
Calcium: 9.2 mg/dL (ref 8.9–10.3)
Chloride: 101 mmol/L (ref 98–111)
Creatinine, Ser: 0.92 mg/dL (ref 0.61–1.24)
GFR, Estimated: >60 mL/min (ref >60)
Glucose, Bld: 119 mg/dL — ABNORMAL HIGH (ref 70–99)
Potassium: 2.9 mmol/L — ABNORMAL LOW (ref 3.5–5.1)
Sodium: 137 mmol/L (ref 135–145)

## 2022-11-05 LAB — TROPONIN I (HIGH SENSITIVITY)
Troponin I (High Sensitivity): 8 ng/L (ref <18)
Troponin I (High Sensitivity): 7 ng/L (ref <18)

## 2022-11-05 LAB — CBC WITH DIFFERENTIAL/PLATELET
Abs Immature Granulocytes: 0.01 10*3/uL (ref 0.00–0.07)
Basophils Absolute: 0 10*3/uL (ref 0.0–0.1)
Basophils Relative: 1 %
Eosinophils Absolute: 0 10*3/uL (ref 0.0–0.5)
Eosinophils Relative: 0 %
HCT: 43.4 % (ref 39.0–52.0)
Hemoglobin: 14.3 g/dL (ref 13.0–17.0)
Immature Granulocytes: 0 %
Lymphocytes Relative: 19 %
Lymphs Abs: 1.2 10*3/uL (ref 0.7–4.0)
MCH: 28.8 pg (ref 26.0–34.0)
MCHC: 32.9 g/dL (ref 30.0–36.0)
MCV: 87.3 fL (ref 80.0–100.0)
Monocytes Absolute: 0.4 10*3/uL (ref 0.1–1.0)
Monocytes Relative: 7 %
Neutro Abs: 4.4 10*3/uL (ref 1.7–7.7)
Neutrophils Relative %: 73 %
Platelets: 232 10*3/uL (ref 150–400)
RBC: 4.97 MIL/uL (ref 4.22–5.81)
RDW: 14.1 % (ref 11.5–15.5)
WBC: 6 10*3/uL (ref 4.0–10.5)
nRBC: 0 % (ref 0.0–0.2)

## 2022-11-05 LAB — HEPATIC FUNCTION PANEL
ALT: 61 U/L — ABNORMAL HIGH (ref 0–44)
AST: 33 U/L (ref 15–41)
Albumin: 4.5 g/dL (ref 3.5–5.0)
Alkaline Phosphatase: 41 U/L (ref 38–126)
Bilirubin, Direct: 0.1 mg/dL (ref 0.0–0.2)
Indirect Bilirubin: 0.7 mg/dL (ref 0.3–0.9)
Total Bilirubin: 0.8 mg/dL (ref 0.3–1.2)
Total Protein: 7.3 g/dL (ref 6.5–8.1)

## 2022-11-05 LAB — D-DIMER, QUANTITATIVE: D-Dimer, Quant: <0.27 ug/mL-FEU (ref 0.00–0.50)

## 2022-11-05 MED ORDER — LIDOCAINE 5 % EX PTCH: 1.0000 | MEDICATED_PATCH | CUTANEOUS | 0 refills | Status: DC

## 2022-11-05 MED ORDER — POTASSIUM CHLORIDE CRYS ER 20 MEQ PO TBCR
40.0000 meq | EXTENDED_RELEASE_TABLET | Freq: Once | ORAL | Status: AC
  Administered 2022-11-05: 40 meq via ORAL
  Filled 2022-11-05: qty 2

## 2022-11-05 MED ORDER — CYCLOBENZAPRINE HCL 5 MG PO TABS: 5.0000 mg | ORAL_TABLET | Freq: Two times a day (BID) | ORAL | 0 refills | Status: DC | PRN

## 2022-11-05 NOTE — Discharge Instructions (Signed)
Your history, exam, work-up today were overall reassuring.  Your potassium was low so please continue your potassium supplementation at home and follow-up with your primary doctor for reevaluation.  Your heart enzymes were negative and your blood clot test was negative as well.  Given your reassuring exam, we do feel you are safe for discharge home after proving stability for over 3 hours.  Please follow-up with your primary doctor and if any symptoms change or worsen acutely, please return to the nearest emergency department.

## 2022-11-05 NOTE — ED Provider Notes (Signed)
3:47 PM Care assumed for Dr. Maryan Rued.  At time of transfer of care, patient is waiting for results of work-up including D-dimer, delta troponin, and reassessment.  Patient was having some torso pain after doing some yard work today that exacerbated while in church.  If work-up is reassuring, anticipate suspected musculoskeletal pain and discharge home for PCP follow-up.  4:48 PM Delta troponin negative and D-dimer negative.  Patient agrees with plan for discharge home.  He will follow-up with PCP and understood return precautions.  Patient will get prescription for muscle relaxant and Lidoderm patches and they agree with plan.  Patient discharged in good condition with improving symptoms and reassuring work-up otherwise.   Clinical Impression: 1. Acute left-sided back pain, unspecified back location   2. Musculoskeletal pain   3. Hypokalemia     Disposition: Discharge  Condition: Good  I have discussed the results, Dx and Tx plan with the pt(& family if present). He/she/they expressed understanding and agree(s) with the plan. Discharge instructions discussed at great length. Strict return precautions discussed and pt &/or family have verbalized understanding of the instructions. No further questions at time of discharge.    New Prescriptions   CYCLOBENZAPRINE (FLEXERIL) 5 MG TABLET    Take 1 tablet (5 mg total) by mouth 2 (two) times daily as needed for muscle spasms.   LIDOCAINE (LIDODERM) 5 %    Place 1 patch onto the skin daily. Remove & Discard patch within 12 hours or as directed by MD    Follow Up: [place] [id number] [phone] Schedule an appointment as soon as possible for a visit    [place] [address] [id number] [place] [phone]         Kipper Buch, Gwenyth Allegra, MD 11/05/22 1650

## 2022-11-05 NOTE — ED Provider Triage Note (Signed)
Emergency Medicine Provider Triage Evaluation Note  Bryce Rodriguez , a 71 y.o. male  was evaluated in triage.  Pt complains of 2 episodes of sharp left-sided rib/back pain accompanied with shortness of breath and difficulty finding his words.  First occurred while he was at church earlier today, pain began to increase until the end of mass.  Then was trying to have a conversation with somebody and began feeling breathless.  This dissipated within 3 to 4 minutes.  Walked over to the fire station nearby, had another episode of this, which then relieved again after he sat down and rested.  Denies fever, chills, cough, recent URI.  Does admit to recent excessive yard work on Friday and lingering soreness in the left side of his back.  Hx of HTN, hyperlipidemia, prediabetes, arterial insufficiency, shoulder arthritis.  Review of Systems  Positive:  Negative: See above  Physical Exam  BP (!) 145/87 (BP Location: Left Arm)   Pulse 64   Temp 97.9 F (36.6 C) (Oral)   Resp 18   Ht 5' 5.5" (1.664 m)   Wt 85.3 kg   SpO2 100%   BMI 30.81 kg/m  Gen:   Awake, no distress   Resp:  Normal effort, equal chest rise, CTAB MSK:   Moves extremities without difficulty  Other:  No cardiac murmur appreciated.  Abdomen soft nontender.  Chest nontender.  Mild tenderness of the left paraspinous muscles near T2-T6.  Medical Decision Making  Medically screening exam initiated at 1:14 PM.  Appropriate orders placed.  Bryce Rodriguez was informed that the remainder of the evaluation will be completed by another provider, this initial triage assessment does not replace that evaluation, and the importance of remaining in the ED until their evaluation is complete.     Bryce Rome, PA-C 59/16/38 1326

## 2022-11-05 NOTE — ED Provider Notes (Signed)
[place] EMERGENCY DEPARTMENT Provider Note   CSN: [id number] Arrival date & time: 11/05/22  1253     History  Chief Complaint  Patient presents with   Back Pain    Bryce Rodriguez is a 71 y.o. male.  Patient is a 71 year old male with a history of hypertension, hyperlipidemia, prediabetes who is presenting today with sudden onset of left shoulder blade pain that started while he was at church.  He reports he was sitting at church doing nothing when he suddenly developed this deep intense pain in his left shoulder blade.  It also made him feel short of breath.  The pain while he was at church got up to 8 out of 10 and the shortness of breath lasted for several hours.  However now the shortness of breath is gone but he still has the pain in the same area.  He denies any pain or numbness going down his arms, into the front of his chest or into his abdomen.  He has had not had a cough, congestion, fever, nausea or vomiting.  He denies ever having anything like this in the past.  He denies any known history of cardiac issues other than his hypertension which has been well treated for quite some time on his current medications.  He states moving his arm does not particularly make the pain any worse but taking a deep breath does make it slightly worse.  On Friday he did blow leaves for 3 hours and was thinking maybe that would have irritated things but he did not have any pain in that area until sitting at church today.  Otherwise he was having some mild soreness in his back and hips but this is different.  No unilateral leg pain or swelling.  The history is provided by the patient.  Back Pain      Home Medications Prior to Admission medications   Medication Sig Start Date End Date Taking? Authorizing Provider  amLODipine (NORVASC) 10 MG tablet TAKE 1 TABLET BY MOUTH EVERY DAY 01/14/20   Delia Chimes A, MD  aspirin 81 MG tablet Take 81 mg by mouth daily.    [provider]  COVID-19 mRNA Vac-TriS, Pfizer, (PFIZER-BIONT COVID-19 VAC-TRIS) SUSP injection Inject into the muscle. 04/15/21   Carlyle Basques, MD  diclofenac (VOLTAREN) 75 MG EC tablet Take 1 tablet (75 mg total) by mouth 2 (two) times daily. 01/27/21   Posey Boyer, MD  gabapentin (NEURONTIN) 300 MG capsule TAKE 1 CAPSULE BY MOUTH AT BEDTIME. FOR TINGLING AND NUMBNESS 06/02/19   Delia Chimes A, MD  HYDROcodone-acetaminophen (NORCO) 5-325 MG tablet Take 1 tablet by mouth every 6 (six) hours as needed. 09/17/20   Posey Boyer, MD  losartan-hydrochlorothiazide (HYZAAR) 100-25 MG tablet TAKE 1 TABLET BY MOUTH EVERY DAY 01/14/20   Forrest Moron, MD  methocarbamol (ROBAXIN) 500 MG tablet Take 1 tablet (500 mg total) by mouth 4 (four) times daily. 01/28/21   Posey Boyer, MD  potassium chloride (KLOR-CON) 10 MEQ tablet Take 1 tablet (10 mEq total) by mouth daily. 01/28/21   Posey Boyer, MD  predniSONE (DELTASONE) 20 MG tablet Take 3 daily for 2 days, then 2 daily for 2 days, then 1 daily for 2 days.  Best taken in the morning after breakfast. 09/17/20   Posey Boyer, MD  rosuvastatin (CRESTOR) 20 MG tablet Take 1 tablet (20 mg total) by mouth daily. 06/01/20   Jacelyn Pi, Lilia Argue,  MD  tadalafil (CIALIS) 5 MG tablet TAKE 1 TABLET BY MOUTH DAILY AS NEEDED FOR ERECTILE DYSFUNCTION 12/04/19   Forrest Moron, MD  testosterone cypionate (DEPOTESTOSTERONE CYPIONATE) 200 MG/ML injection Inject 1 mL (200 mg total) into the muscle every 14 (fourteen) days. Patient not taking: Reported on 03/09/2020 11/21/18 11/21/19  Forrest Moron, MD      Allergies    Tomato    Review of Systems   Review of Systems  Musculoskeletal:  Positive for back pain.    Physical Exam Updated Vital Signs BP (!) 145/87 (BP Location: Left Arm)   Pulse 64   Temp 97.9 F (36.6 C) (Oral)   Resp 18   Ht 5' 5.5" (1.664 m)   Wt 85.3 kg   SpO2 100%   BMI 30.81 kg/m  Physical Exam Vitals and nursing note  reviewed.  Constitutional:      General: He is not in acute distress.    Appearance: He is well-developed.  HENT:     Head: Normocephalic and atraumatic.  Eyes:     Conjunctiva/sclera: Conjunctivae normal.     Pupils: Pupils are equal, round, and reactive to light.  Cardiovascular:     Rate and Rhythm: Normal rate and regular rhythm.     Pulses: Normal pulses.     Heart sounds: No murmur heard. Pulmonary:     Effort: Pulmonary effort is normal. No respiratory distress.     Breath sounds: Normal breath sounds. No wheezing or rales.  Abdominal:     General: There is no distension.     Palpations: Abdomen is soft.     Tenderness: There is no abdominal tenderness. There is no guarding or rebound.  Musculoskeletal:        General: No tenderness. Normal range of motion.     Cervical back: Normal range of motion and neck supple.     Right lower leg: No edema.     Left lower leg: No edema.     Comments: No pain reproduced with palpation in the shoulder blade region  Skin:    General: Skin is warm and dry.     Findings: No erythema or rash.  Neurological:     Mental Status: He is alert and oriented to person, place, and time. Mental status is at baseline.  Psychiatric:        Mood and Affect: Mood normal.        Behavior: Behavior normal.     ED Results / Procedures / Treatments   Labs (all labs ordered are listed, but only abnormal results are displayed) Labs Reviewed  BASIC METABOLIC PANEL - Abnormal; Notable for the following components:      Result Value   Potassium 2.9 (*)    Glucose, Bld 119 (*)    All other components within normal limits  CBC WITH DIFFERENTIAL/PLATELET  HEPATIC FUNCTION PANEL  D-DIMER, QUANTITATIVE (NOT AT [facility])  TROPONIN I (HIGH SENSITIVITY)  TROPONIN I (HIGH SENSITIVITY)    EKG EKG Interpretation  Date/Time:  Sunday November 05 2022 14:09:15 EST Ventricular Rate:  65 PR Interval:  172 QRS Duration: 114 QT Interval:  412 QTC  Calculation: 428 R Axis:   -40 Text Interpretation: Normal sinus rhythm Left axis deviation Pulmonary disease pattern No previous ECGs available Confirmed by Blanchie Dessert ([id number]) on 11/05/2022 2:39:41 PM  Radiology DG Chest 2 View  Result Date: 11/05/2022 CLINICAL DATA:  Shortness of breath, left chest pain EXAM: CHEST - 2 VIEW COMPARISON:  11/22/2011  FINDINGS: The heart size and mediastinal contours are within normal limits. Both lungs are clear. There is surgical fusion in lower cervical spine. IMPRESSION: No active cardiopulmonary disease. Electronically Signed   By: Elmer Picker M.D.   On: 11/05/2022 14:44    Procedures Procedures    Medications Ordered in ED Medications - No data to display  ED Course/ Medical Decision Making/ A&P                           Medical Decision Making Amount and/or Complexity of Data Reviewed Labs: ordered.   Pt with multiple medical problems and comorbidities and presenting today with a complaint that caries a high risk for morbidity and mortality.  Here today with sudden onset of left shoulder blade pain which initially made him short of breath.  Patient denies any shortness of breath now he is not tachypneic, tachycardic or hypoxic.  He is a low risk Wells criteria and symptoms are not classic for ACS.  Concern for possible PE, dissection.  Patient's symptoms do not seem to be GI related.  Also possible that this could be musculoskeletal but not reproduced with arm movement or palpation.  He does not smoke and has not had recent URI symptoms with lower suspicion for pneumothorax or pneumonia.  I independently interpreted patient's EKG and labs.  EKG is within normal limits today without acute changes.  CBC today within normal limits, BMP with hypokalemia of 2.9 but normal renal function, initial troponin is 8.  D-dimer is pending.  Patient will be given oral potassium most likely from his hydrochlorothiazide and patient does take home  potassium.  I have independently visualized and interpreted pt's images today.  Chest x-ray within normal limits.            Final Clinical Impression(s) / ED Diagnoses Final diagnoses:  None    Rx / DC Orders ED Discharge Orders     None         Blanchie Dessert, MD 11/05/22 1519

## 2022-11-05 NOTE — ED Triage Notes (Signed)
[place] [facility] [place] c/o back pain and pain under his left shoulder blade and SHOB and difficulty speaking. Pt's LKW as 11am he stopped at the fire station at 1130am.

## 2024-08-29 ENCOUNTER — Other Ambulatory Visit: Payer: Self-pay | Admitting: Registered Nurse

## 2024-08-29 ENCOUNTER — Ambulatory Visit
Admission: RE | Admit: 2024-08-29 | Discharge: 2024-08-29 | Disposition: A | Discharge status: Home / Self Care | Source: Ambulatory Visit | Attending: Registered Nurse | Admitting: Registered Nurse

## 2024-08-29 DIAGNOSIS — M549 Dorsalgia, unspecified: Secondary | ICD-10-CM

## 2024-08-29 DIAGNOSIS — M25551 Pain in right hip: Secondary | ICD-10-CM

## 2024-09-05 ENCOUNTER — Emergency Department (HOSPITAL_BASED_OUTPATIENT_CLINIC_OR_DEPARTMENT_OTHER)
Admission: EM | Admit: 2024-09-05 | Discharge: 2024-09-05 | Disposition: A | Discharge status: Home / Self Care | Attending: Emergency Medicine | Admitting: Emergency Medicine

## 2024-09-05 ENCOUNTER — Encounter (HOSPITAL_BASED_OUTPATIENT_CLINIC_OR_DEPARTMENT_OTHER): Payer: Self-pay | Admitting: Emergency Medicine

## 2024-09-05 ENCOUNTER — Other Ambulatory Visit: Payer: Self-pay

## 2024-09-05 DIAGNOSIS — F17220 Nicotine dependence, chewing tobacco, uncomplicated: Secondary | ICD-10-CM | POA: Diagnosis not present

## 2024-09-05 DIAGNOSIS — I10 Essential (primary) hypertension: Secondary | ICD-10-CM | POA: Insufficient documentation

## 2024-09-05 DIAGNOSIS — Z79899 Other long term (current) drug therapy: Secondary | ICD-10-CM | POA: Insufficient documentation

## 2024-09-05 DIAGNOSIS — Z7982 Long term (current) use of aspirin: Secondary | ICD-10-CM | POA: Insufficient documentation

## 2024-09-05 DIAGNOSIS — R21 Rash and other nonspecific skin eruption: Secondary | ICD-10-CM | POA: Diagnosis present

## 2024-09-05 MED ORDER — PREDNISONE 20 MG PO TABS: 40.0000 mg | ORAL_TABLET | Freq: Every day | ORAL | 0 refills | Status: AC

## 2024-09-05 NOTE — ED Notes (Signed)
 Reviewed AVS/discharge instruction with patient. Time allotted for and all questions answered. Patient is agreeable for d/c and escorted to ed exit by staff.

## 2024-09-05 NOTE — ED Triage Notes (Signed)
 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

## 2024-09-05 NOTE — Discharge Instructions (Signed)
 As discussed, we will send you home with steroids to take over the next few days.  Recommend getting allergy medicine such as Zyrtec/Claritin/Allegra to help with itching.  Recommend follow-up primary care for reassessment.

## 2024-09-05 NOTE — ED Provider Notes (Signed)
 [place] EMERGENCY DEPARTMENT AT [facility] Provider Note   CSN: [id number] Arrival date & time: 09/05/24  1703     Patient presents with: Rash   Gram Bryce Rodriguez is a 73 y.o. male.    Rash   73 year old male presents emergency department with complaints of rash.  Rash present since last night.  States that he did go fishing outside yesterday but does not know whether he got into something.  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  States that he felt like he may have had an insect bite in his left shoulder as well.  Describes them as itching in nature.  Has put Neosporin ointment over the areas which has helped some.  Presents emergency department for further assessment.  Denies any difficulty breathing, feelings of throat closing on him, chest pain, shortness of breath, abdominal pain.  Past medical history significant for hypertension, hyperlipidemia, environmental allergies  Prior to Admission medications   Medication Sig Start Date End Date Taking? Authorizing Provider  amLODipine  (NORVASC ) 10 MG tablet TAKE 1 TABLET BY MOUTH EVERY DAY 01/14/20   Stallings, Zoe A, MD  aspirin 81 MG tablet Take 81 mg by mouth daily.    [provider]  COVID-19 mRNA Vac-TriS, Pfizer, (PFIZER-BIONT COVID-19 VAC-TRIS) SUSP injection Inject into the muscle. 04/15/21   Luiz Channel, MD  cyclobenzaprine  (FLEXERIL ) 5 MG tablet Take 1 tablet (5 mg total) by mouth 2 (two) times daily as needed for muscle spasms. 11/05/22   Tegeler, Lonni PARAS, MD  diclofenac  (VOLTAREN ) 75 MG EC tablet Take 1 tablet (75 mg total) by mouth 2 (two) times daily. 01/27/21   Tish Alm DEL, MD  gabapentin  (NEURONTIN ) 300 MG capsule TAKE 1 CAPSULE BY MOUTH AT BEDTIME. FOR TINGLING AND NUMBNESS 06/02/19   Stallings, Zoe A, MD  HYDROcodone -acetaminophen  (NORCO) 5-325 MG tablet Take 1 tablet by mouth every 6 (six) hours as needed. 09/17/20   Tish Alm DEL, MD  lidocaine  (LIDODERM ) 5 % Place 1 patch onto the skin  daily. Remove & Discard patch within 12 hours or as directed by MD 11/05/22   Tegeler, Lonni PARAS, MD  losartan -hydrochlorothiazide (HYZAAR) 100-25 MG tablet TAKE 1 TABLET BY MOUTH EVERY DAY 01/14/20   Stallings, Zoe A, MD  methocarbamol (ROBAXIN) 500 MG tablet Take 1 tablet (500 mg total) by mouth 4 (four) times daily. 01/28/21   Tish Alm DEL, MD  potassium chloride  (KLOR-CON ) 10 MEQ tablet Take 1 tablet (10 mEq total) by mouth daily. 01/28/21   Tish Alm DEL, MD  predniSONE  (DELTASONE ) 20 MG tablet Take 3 daily for 2 days, then 2 daily for 2 days, then 1 daily for 2 days.  Best taken in the morning after breakfast. 09/17/20   Tish Alm DEL, MD  rosuvastatin  (CRESTOR ) 20 MG tablet Take 1 tablet (20 mg total) by mouth daily. 06/01/20   Melonie Colonel, Mikel HERO, MD  tadalafil  (CIALIS ) 5 MG tablet TAKE 1 TABLET BY MOUTH DAILY AS NEEDED FOR ERECTILE DYSFUNCTION 12/04/19   Stallings, Zoe A, MD  testosterone  cypionate (DEPOTESTOSTERONE CYPIONATE) 200 MG/ML injection Inject 1 mL (200 mg total) into the muscle every 14 (fourteen) days. Patient not taking: Reported on 03/09/2020 11/21/18 11/21/19  Stallings, Zoe A, MD    Allergies: Tomato    Review of Systems  Skin:  Positive for rash.  All other systems reviewed and are negative.   Updated Vital Signs BP (!) 155/93 (BP Location: Right Arm)   Pulse 72   Temp 98.3  F (36.8 C) (Oral)   Resp 18   SpO2 98%   Physical Exam Vitals and nursing note reviewed.  Constitutional:      General: He is not in acute distress.    Appearance: He is well-developed.  HENT:     Head: Normocephalic and atraumatic.  Eyes:     Conjunctiva/sclera: Conjunctivae normal.  Cardiovascular:     Rate and Rhythm: Normal rate and regular rhythm.     Heart sounds: No murmur heard. Pulmonary:     Effort: Pulmonary effort is normal. No respiratory distress.     Breath sounds: Normal breath sounds.  Abdominal:     Palpations: Abdomen is soft.     Tenderness: There is no  abdominal tenderness.  Musculoskeletal:        General: No swelling.     Cervical back: Neck supple.     Comments: Maculopapular rash appreciated bilateral flank, bilateral legs.  No oropharyngeal involvement.  Skin:    General: Skin is warm and dry.     Capillary Refill: Capillary refill takes less than 2 seconds.  Neurological:     Mental Status: He is alert.  Psychiatric:        Mood and Affect: Mood normal.     (all labs ordered are listed, but only abnormal results are displayed) Labs Reviewed - No data to display  EKG: None  Radiology: No results found.   Procedures   Medications Ordered in the ED - No data to display                                  Medical Decision Making  This patient presents to the ED for concern of rash., this involves an extensive number of treatment options, and is a complaint that carries with it a high risk of complications and morbidity.  The differential diagnosis includes cellulitis, erysipelas, necrotizing infectious, contact dermatitis, SJS/TE N, tickborne illness, other   Co morbidities that complicate the patient evaluation  See HPI   Additional history obtained:  Additional history obtained from EMR External records from outside source obtained and reviewed including hospital records   Lab Tests:  N/a   Imaging Studies ordered:  N/a   Cardiac Monitoring: / EKG:  N/a   Consultations Obtained:  N/a   Problem List / ED Course / Critical interventions / Medication management  Rash Reevaluation of the patient showed that the patient stayed the same I have reviewed the patients home medicines and have made adjustments as needed   Social Determinants of Health:  Smokeless tobacco.  Denies illicit drug use.   Test / Admission - Considered:  Rash Vitals signs significant for hypertension blood pressure 155/93. Otherwise within normal range and stable throughout visit.  73 year old male presents  emergency department with complaints of rash.  Rash present since last night.  States that he did go fishing outside yesterday but does not know whether he got into something.  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  States that he felt like he may have had an insect bite in his left shoulder as well.  Describes them as itching in nature.  Has put Neosporin ointment over the areas which has helped some.  Presents emergency department for further assessment.  Denies any difficulty breathing, feelings of throat closing on him, chest pain, shortness of breath, abdominal pain. On exam, rash appreciated bilateral flank extension down both legs as above.  no oropharyngeal involvement. Rash consistent with contact dermatitis.  Will treat with corticosteroids, and histamines and recommend follow-up with primary care.  No clinical evidence of angioedema/anaphylaxis.  Treatment plan discussed with patient he is understanding was agreeable.  Patient well-appearing, afebrile in no acute distress upon discharge. Worrisome signs and symptoms were discussed with the patient, and the patient acknowledged understanding to return to the ED if noticed. Patient was stable upon discharge.       Final diagnoses:  None    ED Discharge Orders     None          Silver Wonda LABOR, [place] 09/05/24 1842    Lenor Hollering, MD 09/05/24 1958

## 2024-11-20 ENCOUNTER — Other Ambulatory Visit: Payer: Self-pay

## 2024-11-20 ENCOUNTER — Encounter (HOSPITAL_BASED_OUTPATIENT_CLINIC_OR_DEPARTMENT_OTHER): Payer: Self-pay | Admitting: Emergency Medicine

## 2024-11-20 ENCOUNTER — Inpatient Hospital Stay (HOSPITAL_BASED_OUTPATIENT_CLINIC_OR_DEPARTMENT_OTHER)
Admission: EM | Admit: 2024-11-20 | Discharge: 2024-11-23 | DRG: 872 | Disposition: A | Discharge status: Home / Self Care | Attending: Internal Medicine | Admitting: Internal Medicine

## 2024-11-20 DIAGNOSIS — R6510 Systemic inflammatory response syndrome (SIRS) of non-infectious origin without acute organ dysfunction: Secondary | ICD-10-CM | POA: Diagnosis present

## 2024-11-20 DIAGNOSIS — Z91018 Allergy to other foods: Secondary | ICD-10-CM | POA: Diagnosis not present

## 2024-11-20 DIAGNOSIS — K5900 Constipation, unspecified: Secondary | ICD-10-CM | POA: Diagnosis not present

## 2024-11-20 DIAGNOSIS — N3001 Acute cystitis with hematuria: Secondary | ICD-10-CM | POA: Diagnosis not present

## 2024-11-20 DIAGNOSIS — E291 Testicular hypofunction: Secondary | ICD-10-CM | POA: Diagnosis not present

## 2024-11-20 DIAGNOSIS — E7800 Pure hypercholesterolemia, unspecified: Secondary | ICD-10-CM | POA: Diagnosis present

## 2024-11-20 DIAGNOSIS — E876 Hypokalemia: Secondary | ICD-10-CM

## 2024-11-20 DIAGNOSIS — B9620 Unspecified Escherichia coli [E. coli] as the cause of diseases classified elsewhere: Secondary | ICD-10-CM | POA: Diagnosis not present

## 2024-11-20 DIAGNOSIS — I10 Essential (primary) hypertension: Secondary | ICD-10-CM | POA: Diagnosis not present

## 2024-11-20 DIAGNOSIS — M545 Low back pain, unspecified: Secondary | ICD-10-CM | POA: Diagnosis not present

## 2024-11-20 DIAGNOSIS — G8929 Other chronic pain: Secondary | ICD-10-CM | POA: Diagnosis not present

## 2024-11-20 DIAGNOSIS — A419 Sepsis, unspecified organism: Secondary | ICD-10-CM | POA: Diagnosis not present

## 2024-11-20 DIAGNOSIS — Z8249 Family history of ischemic heart disease and other diseases of the circulatory system: Secondary | ICD-10-CM | POA: Diagnosis not present

## 2024-11-20 DIAGNOSIS — Z7982 Long term (current) use of aspirin: Secondary | ICD-10-CM | POA: Diagnosis not present

## 2024-11-20 DIAGNOSIS — N390 Urinary tract infection, site not specified: Secondary | ICD-10-CM | POA: Diagnosis present

## 2024-11-20 DIAGNOSIS — Z87891 Personal history of nicotine dependence: Secondary | ICD-10-CM | POA: Diagnosis not present

## 2024-11-20 DIAGNOSIS — Z79899 Other long term (current) drug therapy: Secondary | ICD-10-CM | POA: Diagnosis not present

## 2024-11-20 DIAGNOSIS — E782 Mixed hyperlipidemia: Secondary | ICD-10-CM | POA: Diagnosis not present

## 2024-11-20 DIAGNOSIS — E66811 Obesity, class 1: Secondary | ICD-10-CM | POA: Diagnosis not present

## 2024-11-20 DIAGNOSIS — N400 Enlarged prostate without lower urinary tract symptoms: Secondary | ICD-10-CM | POA: Diagnosis not present

## 2024-11-20 DIAGNOSIS — Z6828 Body mass index (BMI) 28.0-28.9, adult: Secondary | ICD-10-CM | POA: Diagnosis not present

## 2024-11-20 LAB — CBC WITH DIFFERENTIAL/PLATELET
Abs Immature Granulocytes: 0.13 K/uL — ABNORMAL HIGH (ref 0.00–0.07)
Basophils Absolute: 0 K/uL (ref 0.0–0.1)
Basophils Relative: 0 %
Eosinophils Absolute: 0 K/uL (ref 0.0–0.5)
Eosinophils Relative: 0 %
HCT: 40.2 % (ref 39.0–52.0)
Hemoglobin: 13.6 g/dL (ref 13.0–17.0)
Immature Granulocytes: 1 %
Lymphocytes Relative: 5 %
Lymphs Abs: 1 K/uL (ref 0.7–4.0)
MCH: 29.4 pg (ref 26.0–34.0)
MCHC: 33.8 g/dL (ref 30.0–36.0)
MCV: 87 fL (ref 80.0–100.0)
Monocytes Absolute: 1.6 K/uL — ABNORMAL HIGH (ref 0.1–1.0)
Monocytes Relative: 8 %
Neutro Abs: 16.7 K/uL — ABNORMAL HIGH (ref 1.7–7.7)
Neutrophils Relative %: 86 %
Platelets: 232 K/uL (ref 150–400)
RBC: 4.62 MIL/uL (ref 4.22–5.81)
RDW: 15.2 % (ref 11.5–15.5)
WBC: 19.5 K/uL — ABNORMAL HIGH (ref 4.0–10.5)
nRBC: 0 % (ref 0.0–0.2)

## 2024-11-20 LAB — URINALYSIS, ROUTINE W REFLEX MICROSCOPIC
Bilirubin Urine: NEGATIVE
Glucose, UA: NEGATIVE mg/dL
Ketones, ur: NEGATIVE mg/dL
Nitrite: POSITIVE — AB
Protein, ur: 30 mg/dL — AB
Specific Gravity, Urine: 1.018 (ref 1.005–1.030)
WBC, UA: >50 WBC/hpf (ref 0–5)
pH: 6.5 (ref 5.0–8.0)

## 2024-11-20 NOTE — ED Triage Notes (Signed)
  Patient comes in with flank pain and urinary urgency that started Monday.  Patient states he has bilateral flank pain that radiates to groin.  Endorses urinary frequency and urgency.  Denies any dysuria or hematuria.  Took 400 mg advil around 2100 because he felt like he had a fever.  Pain 6/10, sharp.

## 2024-11-21 ENCOUNTER — Emergency Department (HOSPITAL_BASED_OUTPATIENT_CLINIC_OR_DEPARTMENT_OTHER)

## 2024-11-21 DIAGNOSIS — E7800 Pure hypercholesterolemia, unspecified: Secondary | ICD-10-CM | POA: Diagnosis not present

## 2024-11-21 DIAGNOSIS — R7989 Other specified abnormal findings of blood chemistry: Secondary | ICD-10-CM | POA: Diagnosis not present

## 2024-11-21 DIAGNOSIS — E876 Hypokalemia: Secondary | ICD-10-CM

## 2024-11-21 DIAGNOSIS — G8929 Other chronic pain: Secondary | ICD-10-CM | POA: Diagnosis not present

## 2024-11-21 DIAGNOSIS — E782 Mixed hyperlipidemia: Secondary | ICD-10-CM | POA: Diagnosis not present

## 2024-11-21 DIAGNOSIS — E291 Testicular hypofunction: Secondary | ICD-10-CM | POA: Diagnosis not present

## 2024-11-21 DIAGNOSIS — Z7982 Long term (current) use of aspirin: Secondary | ICD-10-CM | POA: Diagnosis not present

## 2024-11-21 DIAGNOSIS — A419 Sepsis, unspecified organism: Secondary | ICD-10-CM | POA: Diagnosis not present

## 2024-11-21 DIAGNOSIS — Z87891 Personal history of nicotine dependence: Secondary | ICD-10-CM | POA: Diagnosis not present

## 2024-11-21 DIAGNOSIS — Z6828 Body mass index (BMI) 28.0-28.9, adult: Secondary | ICD-10-CM | POA: Diagnosis not present

## 2024-11-21 DIAGNOSIS — E66811 Obesity, class 1: Secondary | ICD-10-CM | POA: Diagnosis not present

## 2024-11-21 DIAGNOSIS — N400 Enlarged prostate without lower urinary tract symptoms: Secondary | ICD-10-CM

## 2024-11-21 DIAGNOSIS — R6510 Systemic inflammatory response syndrome (SIRS) of non-infectious origin without acute organ dysfunction: Secondary | ICD-10-CM

## 2024-11-21 DIAGNOSIS — M545 Low back pain, unspecified: Secondary | ICD-10-CM | POA: Diagnosis not present

## 2024-11-21 DIAGNOSIS — Z91018 Allergy to other foods: Secondary | ICD-10-CM | POA: Diagnosis not present

## 2024-11-21 DIAGNOSIS — N3001 Acute cystitis with hematuria: Secondary | ICD-10-CM | POA: Diagnosis not present

## 2024-11-21 DIAGNOSIS — Z8249 Family history of ischemic heart disease and other diseases of the circulatory system: Secondary | ICD-10-CM | POA: Diagnosis not present

## 2024-11-21 DIAGNOSIS — E785 Hyperlipidemia, unspecified: Secondary | ICD-10-CM

## 2024-11-21 DIAGNOSIS — B9620 Unspecified Escherichia coli [E. coli] as the cause of diseases classified elsewhere: Secondary | ICD-10-CM | POA: Diagnosis not present

## 2024-11-21 DIAGNOSIS — K5900 Constipation, unspecified: Secondary | ICD-10-CM | POA: Diagnosis not present

## 2024-11-21 DIAGNOSIS — I10 Essential (primary) hypertension: Secondary | ICD-10-CM | POA: Diagnosis not present

## 2024-11-21 DIAGNOSIS — Z79899 Other long term (current) drug therapy: Secondary | ICD-10-CM | POA: Diagnosis not present

## 2024-11-21 DIAGNOSIS — N390 Urinary tract infection, site not specified: Secondary | ICD-10-CM | POA: Diagnosis not present

## 2024-11-21 LAB — CBC WITH DIFFERENTIAL/PLATELET
Abs Immature Granulocytes: 0.43 K/uL — ABNORMAL HIGH (ref 0.00–0.07)
Basophils Absolute: 0.1 K/uL (ref 0.0–0.1)
Basophils Relative: 0 %
Eosinophils Absolute: 0 K/uL (ref 0.0–0.5)
Eosinophils Relative: 0 %
HCT: 41.1 % (ref 39.0–52.0)
Hemoglobin: 13.3 g/dL (ref 13.0–17.0)
Immature Granulocytes: 2 %
Lymphocytes Relative: 4 %
Lymphs Abs: 1.1 K/uL (ref 0.7–4.0)
MCH: 29 pg (ref 26.0–34.0)
MCHC: 32.4 g/dL (ref 30.0–36.0)
MCV: 89.7 fL (ref 80.0–100.0)
Monocytes Absolute: 2 K/uL — ABNORMAL HIGH (ref 0.1–1.0)
Monocytes Relative: 8 %
Neutro Abs: 20.7 K/uL — ABNORMAL HIGH (ref 1.7–7.7)
Neutrophils Relative %: 86 %
Platelets: 237 K/uL (ref 150–400)
RBC: 4.58 MIL/uL (ref 4.22–5.81)
RDW: 15.7 % — ABNORMAL HIGH (ref 11.5–15.5)
WBC: 24.2 K/uL — ABNORMAL HIGH (ref 4.0–10.5)
nRBC: 0 % (ref 0.0–0.2)

## 2024-11-21 LAB — COMPREHENSIVE METABOLIC PANEL WITH GFR
ALT: 79 U/L — ABNORMAL HIGH (ref 0–44)
AST: 27 U/L (ref 15–41)
Albumin: 4.4 g/dL (ref 3.5–5.0)
Alkaline Phosphatase: 56 U/L (ref 38–126)
Anion gap: 13 (ref 5–15)
BUN: 16 mg/dL (ref 8–23)
CO2: 28 mmol/L (ref 22–32)
Calcium: 9.9 mg/dL (ref 8.9–10.3)
Chloride: 99 mmol/L (ref 98–111)
Creatinine, Ser: 1.07 mg/dL (ref 0.61–1.24)
GFR, Estimated: >60 mL/min (ref >60)
Glucose, Bld: 126 mg/dL — ABNORMAL HIGH (ref 70–99)
Potassium: 2.7 mmol/L — CL (ref 3.5–5.1)
Sodium: 140 mmol/L (ref 135–145)
Total Bilirubin: 0.5 mg/dL (ref 0.0–1.2)
Total Protein: 7 g/dL (ref 6.5–8.1)

## 2024-11-21 LAB — BASIC METABOLIC PANEL WITH GFR
Anion gap: 13 (ref 5–15)
BUN: 11 mg/dL (ref 8–23)
CO2: 24 mmol/L (ref 22–32)
Calcium: 9.7 mg/dL (ref 8.9–10.3)
Chloride: 100 mmol/L (ref 98–111)
Creatinine, Ser: 0.9 mg/dL (ref 0.61–1.24)
GFR, Estimated: >60 mL/min (ref >60)
Glucose, Bld: 131 mg/dL — ABNORMAL HIGH (ref 70–99)
Potassium: 3.5 mmol/L (ref 3.5–5.1)
Sodium: 136 mmol/L (ref 135–145)

## 2024-11-21 LAB — LACTIC ACID, PLASMA: Lactic Acid, Venous: 0.9 mmol/L (ref 0.5–1.9)

## 2024-11-21 LAB — MAGNESIUM: Magnesium: 2 mg/dL (ref 1.7–2.4)

## 2024-11-21 MED ORDER — POTASSIUM CHLORIDE 10 MEQ/100ML IV SOLN
10.0000 meq | INTRAVENOUS | Status: AC
  Administered 2024-11-21 (×3): 10 meq via INTRAVENOUS
  Filled 2024-11-21 (×4): qty 100

## 2024-11-21 MED ORDER — FENTANYL CITRATE (PF) 50 MCG/ML IJ SOSY
50.0000 ug | PREFILLED_SYRINGE | Freq: Once | INTRAMUSCULAR | Status: AC
  Administered 2024-11-21: 50 ug via INTRAVENOUS
  Filled 2024-11-21: qty 1

## 2024-11-21 MED ORDER — MORPHINE SULFATE (PF) 4 MG/ML IV SOLN: 4.0000 mg | INTRAVENOUS | Status: DC | PRN

## 2024-11-21 MED ORDER — SENNOSIDES-DOCUSATE SODIUM 8.6-50 MG PO TABS
2.0000 | ORAL_TABLET | Freq: Two times a day (BID) | ORAL | Status: DC
  Administered 2024-11-21 – 2024-11-22 (×4): 2 via ORAL
  Filled 2024-11-21 (×5): qty 2

## 2024-11-21 MED ORDER — LACTATED RINGERS IV BOLUS (SEPSIS)
1000.0000 mL | Freq: Once | INTRAVENOUS | Status: AC
  Administered 2024-11-21: 1000 mL via INTRAVENOUS

## 2024-11-21 MED ORDER — ENOXAPARIN SODIUM 40 MG/0.4ML IJ SOSY
40.0000 mg | PREFILLED_SYRINGE | Freq: Every day | INTRAMUSCULAR | Status: DC
  Administered 2024-11-21 – 2024-11-22 (×2): 40 mg via SUBCUTANEOUS
  Filled 2024-11-21 (×2): qty 0.4

## 2024-11-21 MED ORDER — MORPHINE SULFATE (PF) 4 MG/ML IV SOLN
4.0000 mg | INTRAVENOUS | Status: DC | PRN
  Administered 2024-11-21: 4 mg via INTRAVENOUS
  Filled 2024-11-21: qty 1

## 2024-11-21 MED ORDER — ACETAMINOPHEN 325 MG PO TABS: 650.0000 mg | ORAL_TABLET | Freq: Four times a day (QID) | ORAL | Status: DC | PRN

## 2024-11-21 MED ORDER — SODIUM CHLORIDE 0.9 % IV SOLN: INTRAVENOUS | Status: DC

## 2024-11-21 MED ORDER — POTASSIUM CHLORIDE CRYS ER 20 MEQ PO TBCR
40.0000 meq | EXTENDED_RELEASE_TABLET | Freq: Once | ORAL | Status: AC
  Administered 2024-11-21: 40 meq via ORAL
  Filled 2024-11-21: qty 2

## 2024-11-21 MED ORDER — AMLODIPINE BESYLATE 10 MG PO TABS
10.0000 mg | ORAL_TABLET | Freq: Every day | ORAL | Status: DC
  Administered 2024-11-21 – 2024-11-23 (×3): 10 mg via ORAL
  Filled 2024-11-21 (×3): qty 1

## 2024-11-21 MED ORDER — SODIUM CHLORIDE 0.9 % IV SOLN
2.0000 g | Freq: Once | INTRAVENOUS | Status: AC
  Administered 2024-11-21: 2 g via INTRAVENOUS
  Filled 2024-11-21: qty 20

## 2024-11-21 MED ORDER — PROCHLORPERAZINE EDISYLATE 10 MG/2ML IJ SOLN: 10.0000 mg | Freq: Four times a day (QID) | INTRAMUSCULAR | Status: DC | PRN

## 2024-11-21 MED ORDER — GABAPENTIN 300 MG PO CAPS: 300.0000 mg | ORAL_CAPSULE | Freq: Two times a day (BID) | ORAL | Status: DC

## 2024-11-21 MED ORDER — ENOXAPARIN SODIUM 40 MG/0.4ML IJ SOSY: 40.0000 mg | PREFILLED_SYRINGE | INTRAMUSCULAR | Status: DC

## 2024-11-21 MED ORDER — ACETAMINOPHEN 650 MG RE SUPP: 650.0000 mg | Freq: Four times a day (QID) | RECTAL | Status: DC | PRN

## 2024-11-21 MED ORDER — ASPIRIN 81 MG PO TBEC
81.0000 mg | DELAYED_RELEASE_TABLET | Freq: Every day | ORAL | Status: DC
  Administered 2024-11-21 – 2024-11-23 (×3): 81 mg via ORAL
  Filled 2024-11-21 (×3): qty 1

## 2024-11-21 MED ORDER — TAMSULOSIN HCL 0.4 MG PO CAPS
0.4000 mg | ORAL_CAPSULE | Freq: Every day | ORAL | Status: DC
  Administered 2024-11-21 – 2024-11-23 (×3): 0.4 mg via ORAL
  Filled 2024-11-21 (×3): qty 1

## 2024-11-21 MED ORDER — POLYETHYLENE GLYCOL 3350 17 G PO PACK: 17.0000 g | PACK | Freq: Every day | ORAL | Status: DC | PRN

## 2024-11-21 MED ORDER — ROSUVASTATIN CALCIUM 20 MG PO TABS
20.0000 mg | ORAL_TABLET | Freq: Every day | ORAL | Status: DC
  Administered 2024-11-22 – 2024-11-23 (×2): 20 mg via ORAL
  Filled 2024-11-21 (×2): qty 1

## 2024-11-21 MED ORDER — HYDROCODONE-ACETAMINOPHEN 5-325 MG PO TABS: 1.0000 | ORAL_TABLET | ORAL | Status: DC | PRN

## 2024-11-21 MED ORDER — SODIUM CHLORIDE 0.9 % IV SOLN
2.0000 g | INTRAVENOUS | Status: DC
  Administered 2024-11-21 – 2024-11-23 (×3): 2 g via INTRAVENOUS
  Filled 2024-11-21 (×3): qty 20

## 2024-11-21 NOTE — Sepsis Progress Note (Signed)
 Elink monitoring for the code sepsis protocol.

## 2024-11-21 NOTE — ED Provider Notes (Signed)
 [place] EMERGENCY DEPARTMENT AT [facility]  Provider Note  CSN: [id number] Arrival date & time: 11/20/24 2300  History Chief Complaint  Patient presents with  . Flank Pain  . Urinary Urgency    Bryce Rodriguez is a 73 y.o. male reports 3 days of bilateral flank pain, urinary urgency/frequency and decreased UOP, subjective fever earlier tonight took motrin PTA. No N/V.    Home Medications Prior to Admission medications   Medication Sig Start Date End Date Taking? Authorizing Provider  amLODipine  (NORVASC ) 10 MG tablet TAKE 1 TABLET BY MOUTH EVERY DAY 01/14/20   Stallings, Zoe A, MD  aspirin  81 MG tablet Take 81 mg by mouth daily.    [provider]  COVID-19 mRNA Vac-TriS, Pfizer, (PFIZER-BIONT COVID-19 VAC-TRIS) SUSP injection Inject into the muscle. 04/15/21   Luiz Channel, MD  cyclobenzaprine  (FLEXERIL ) 5 MG tablet Take 1 tablet (5 mg total) by mouth 2 (two) times daily as needed for muscle spasms. 11/05/22   Tegeler, Lonni PARAS, MD  diclofenac  (VOLTAREN ) 75 MG EC tablet Take 1 tablet (75 mg total) by mouth 2 (two) times daily. 01/27/21   Tish Alm DEL, MD  gabapentin  (NEURONTIN ) 300 MG capsule TAKE 1 CAPSULE BY MOUTH AT BEDTIME. FOR TINGLING AND NUMBNESS 06/02/19   Stallings, Zoe A, MD  HYDROcodone -acetaminophen  (NORCO) 5-325 MG tablet Take 1 tablet by mouth every 6 (six) hours as needed. 09/17/20   Tish Alm DEL, MD  lidocaine  (LIDODERM ) 5 % Place 1 patch onto the skin daily. Remove & Discard patch within 12 hours or as directed by MD 11/05/22   Tegeler, Lonni PARAS, MD  losartan -hydrochlorothiazide (HYZAAR) 100-25 MG tablet TAKE 1 TABLET BY MOUTH EVERY DAY 01/14/20   Stallings, Zoe A, MD  methocarbamol (ROBAXIN) 500 MG tablet Take 1 tablet (500 mg total) by mouth 4 (four) times daily. 01/28/21   Tish Alm DEL, MD  potassium chloride  (KLOR-CON ) 10 MEQ tablet Take 1 tablet (10 mEq total) by mouth daily. 01/28/21   Tish Alm DEL, MD  rosuvastatin   (CRESTOR ) 20 MG tablet Take 1 tablet (20 mg total) by mouth daily. 06/01/20   Melonie Colonel, Mikel HERO, MD  tadalafil  (CIALIS ) 5 MG tablet TAKE 1 TABLET BY MOUTH DAILY AS NEEDED FOR ERECTILE DYSFUNCTION 12/04/19   Stallings, Zoe A, MD  testosterone  cypionate (DEPOTESTOSTERONE CYPIONATE) 200 MG/ML injection Inject 1 mL (200 mg total) into the muscle every 14 (fourteen) days. Patient not taking: Reported on 03/09/2020 11/21/18 11/21/19  Bettie Santana LABOR, MD     Allergies    Tomato   Review of Systems   Review of Systems Please see HPI for pertinent positives and negatives  Physical Exam BP (!) 149/97   Pulse 99   Temp 99.2 F (37.3 C)   Resp 18   Ht 5' 6 (1.676 m)   Wt 79.8 kg   SpO2 99%   BMI 28.41 kg/m   Physical Exam Vitals and nursing note reviewed.  Constitutional:      Appearance: Normal appearance.  HENT:     Head: Normocephalic and atraumatic.     Nose: Nose normal.     Mouth/Throat:     Mouth: Mucous membranes are moist.  Eyes:     Extraocular Movements: Extraocular movements intact.     Conjunctiva/sclera: Conjunctivae normal.  Cardiovascular:     Rate and Rhythm: Normal rate.  Pulmonary:     Effort: Pulmonary effort is normal.     Breath sounds: Normal breath sounds.  Abdominal:  General: Abdomen is flat.     Palpations: Abdomen is soft.     Tenderness: There is no abdominal tenderness.  Musculoskeletal:        General: No swelling. Normal range of motion.     Cervical back: Neck supple.  Skin:    General: Skin is warm and dry.  Neurological:     General: No focal deficit present.     Mental Status: He is alert.  Psychiatric:        Mood and Affect: Mood normal.     ED Results / Procedures / Treatments   EKG EKG Interpretation Date/Time:  Friday November 21 2024 01:57:22 EST Ventricular Rate:  94 PR Interval:  137 QRS Duration:  94 QT Interval:  358 QTC Calculation: 448 R Axis:   -59  Text Interpretation: Sinus rhythm Left anterior  fascicular block Abnormal R-wave progression, early transition No significant change since last tracing Confirmed by Roselyn Dunnings [phone]) on 11/21/2024 2:04:02 AM  Procedures Procedures  Medications Ordered in the ED Medications  lactated ringers  bolus 1,000 mL (1,000 mLs Intravenous New Bag/Given 11/21/24 0149)  cefTRIAXone  (ROCEPHIN ) 2 g in sodium chloride  0.9 % 100 mL IVPB (2 g Intravenous New Bag/Given 11/21/24 0151)  potassium chloride  10 mEq in 100 mL IVPB (10 mEq Intravenous New Bag/Given 11/21/24 0143)  potassium chloride  SA (KLOR-CON  M) CR tablet 40 mEq (40 mEq Oral Given 11/21/24 0154)  fentaNYL  (SUBLIMAZE ) injection 50 mcg (50 mcg Intravenous Given 11/21/24 0155)    Initial Impression and Plan  Patient here with symptoms of UTI and/or pyelo vs renal colic. Initially tachycardic but not febrile. BP has been adequate. Labs done in triage show CBC with leukocytosis, patient now meet sepsis criteria, cultures, lactic acid and IVF added. CMP with hypokalemia, similar but slightly worse than previous. UA with infection, culture added. Will begin Rocephin  for sepsis with UTI as source, also consider prostatitis, as CT shows prostatomegaly. No renal stones or hydronephrosis. Hospitalist paged for admission.   ED Course   Clinical Course as of 11/21/24 0226  Fri Nov 21, 2024  0150 Spoke with Dr. Franky who will accept for admission.  [CS]  0225 Lactic acid is unremarkable.  [CS]    Clinical Course User Index [CS] Roselyn Dunnings NOVAK, MD     MDM Rules/Calculators/A&P Medical Decision Making Problems Addressed: Acute cystitis with hematuria: acute illness or injury Hypokalemia: chronic illness or injury with exacerbation, progression, or side effects of treatment Sepsis without acute organ dysfunction, due to unspecified organism [facility]): acute illness or injury  Amount and/or Complexity of Data Reviewed Labs: ordered. Decision-making details documented in ED Course. Radiology:  ordered and independent interpretation performed. Decision-making details documented in ED Course. ECG/medicine tests: ordered and independent interpretation performed. Decision-making details documented in ED Course.  Risk Prescription drug management. Parenteral controlled substances. Decision regarding hospitalization.     Final Clinical Impression(s) / ED Diagnoses Final diagnoses:  Acute cystitis with hematuria  Hypokalemia  Sepsis without acute organ dysfunction, due to unspecified organism [facility])    Rx / DC Orders ED Discharge Orders     None        Roselyn Dunnings NOVAK, MD 11/21/24 0205

## 2024-11-21 NOTE — Progress Notes (Signed)
 [facility] PROGRESS NOTE   Bryce Rodriguez FMW:[id number] DOB: 01-Aug-1951 DOA: 11/20/2024  PCP: [facility] [address]  Brief History: 73 y.o. male with medical history significant of prostamegaly, hypertension hyperlipidemia and chronic low back pain who presented to the hospital with complaint of fevers chills burning with urination.  Patient was found to have urinary tract infection.  Due to significant symptoms he was hospitalized for further management.    Consultants: None  Procedures: None    Subjective/Interval History: Patient mentions that he is feeling slightly better.  Flank pain has improved.  Still having some discomfort with urinating.  No nausea vomiting.    Assessment/Plan:  Urinary tract infection with sepsis, present on admission. Patient was noted to have leukocytosis tachycardia and abnormal UA at presentation. CT scan did not show any stones.  Prostatomegaly was seen. Urine culture is pending. Patient is on ceftriaxone  which will be continued. Blood cultures pending as well. Will reorder labs for tomorrow morning.  Hypokalemia This was supplemented.  Labs are pending from this morning including magnesium level.  Essential hypertension Amlodipine  being continued.  Elevated blood pressure readings noted.  Continue to monitor.  Hyperlipidemia Continue statin.  Enlarged prostate Prostatomegaly noted on CT scan.  Flomax  has been initiated.  Outpatient urology consultation.  DVT Prophylaxis: Initiate Lovenox  Code Status: Full code Family Communication: Discussed with patient Disposition Plan: Hopefully return home when improved.  Mobilize.  Status is: Inpatient Remains inpatient appropriate because: Need for IV antibiotics      Medications: Scheduled:  amLODipine   10 mg Oral Daily   aspirin  EC  81 mg Oral Daily   gabapentin   300 mg Oral BID   rosuvastatin   20 mg Oral Daily   senna-docusate  2 tablet Oral BID   tamsulosin   0.4 mg Oral  Daily   Continuous:  sodium chloride  100 mL/hr at 11/21/24 0942   cefTRIAXone  (ROCEPHIN )  IV 200 mL/hr at 11/21/24 9057   PRN:acetaminophen  **OR** acetaminophen , HYDROcodone -acetaminophen , morphine  injection, polyethylene glycol, prochlorperazine   Antibiotics: Anti-infectives (From admission, onward)    Start     Dose/Rate Route Frequency Ordered Stop   11/21/24 0600  cefTRIAXone  (ROCEPHIN ) 2 g in sodium chloride  0.9 % 100 mL IVPB        2 g 200 mL/hr over 30 Minutes Intravenous Every 24 hours 11/21/24 0503     11/21/24 0130  cefTRIAXone  (ROCEPHIN ) 2 g in sodium chloride  0.9 % 100 mL IVPB        2 g 200 mL/hr over 30 Minutes Intravenous Once 11/21/24 0117 11/21/24 0221       Objective:  Vital Signs  Vitals:   11/21/24 0330 11/21/24 0414 11/21/24 0415 11/21/24 0750  BP: (!) 154/97  (!) 178/96 (!) 141/79  Pulse: 89  99 100  Resp: (!) 22  18 20   Temp:   99.7 F (37.6 C) 100 F (37.8 C)  TempSrc:   Oral Oral  SpO2: 99%  98% 94%  Weight:  84 kg    Height:  5' 6 (1.676 m)      Intake/Output Summary (Last 24 hours) at 11/21/2024 1108 Last data filed at 11/21/2024 0942 Gross per 24 hour  Intake 1162.23 ml  Output --  Net 1162.23 ml   Filed Weights   11/20/24 2320 11/21/24 0414  Weight: 79.8 kg 84 kg    General appearance: Awake alert.  In no distress Resp: Clear to auscultation bilaterally.  Normal effort Cardio: S1-S2 is normal regular.  No S3-S4.  No  rubs murmurs or bruit GI: Abdomen is soft.  Tender in the suprapubic area without any rebound rigidity or guarding.  No masses organomegaly. Extremities: No edema.  Full range of motion of lower extremities. Neurologic: Alert and oriented x3.  No focal neurological deficits.    Lab Results:  Data Reviewed: I have personally reviewed following labs and reports of the imaging studies  CBC: Recent Labs  Lab 11/20/24 2322  WBC 19.5*  NEUTROABS 16.7*  HGB 13.6  HCT 40.2  MCV 87.0  PLT 232    Basic Metabolic  Panel: Recent Labs  Lab 11/20/24 2322  NA 140  K 2.7*  CL 99  CO2 28  GLUCOSE 126*  BUN 16  CREATININE 1.07  CALCIUM  9.9    GFR: Estimated Creatinine Clearance: 63.5 mL/min (by C-G formula based on SCr of 1.07 mg/dL).  Liver Function Tests: Recent Labs  Lab 11/20/24 2322  AST 27  ALT 79*  ALKPHOS 56  BILITOT 0.5  PROT 7.0  ALBUMIN 4.4     Radiology Studies: CT Renal Stone Study Result Date: 11/21/2024 EXAM: CT UROGRAM 11/21/2024 12:32:05 AM TECHNIQUE: CT of the abdomen and pelvis was performed without the administration of intravenous contrast. Multiplanar reformatted images as well as MIP urogram images are provided for review. Automated exposure control, iterative reconstruction, and/or weight based adjustment of the mA/kV was utilized to reduce the radiation dose to as low as reasonably achievable. COMPARISON: 08/19/2019 CLINICAL HISTORY: Abdominal/flank pain, stone suspected. FINDINGS: LOWER CHEST: No acute abnormality. LIVER: The liver is unremarkable. GALLBLADDER AND BILE DUCTS: Gallbladder is unremarkable. No biliary ductal dilatation. SPLEEN: No acute abnormality. PANCREAS: No acute abnormality. ADRENAL GLANDS: No acute abnormality. KIDNEYS, URETERS AND BLADDER: No stones in the kidneys or ureters. No hydronephrosis. No perinephric or periureteral stranding. Nondistended bladder. GI AND BOWEL: Stomach demonstrates no acute abnormality. There is no bowel obstruction. Normal appendix. PERITONEUM AND RETROPERITONEUM: No ascites. No free air. VASCULATURE: Aortic atherosclerotic calcification. LYMPH NODES: No lymphadenopathy. REPRODUCTIVE ORGANS: Enlarged prostate. BONES AND SOFT TISSUES: No acute osseous abnormality. No focal soft tissue abnormality. IMPRESSION: 1. No acute abnormality in the abdomen or pelvis. 2. Prostatomegaly. Electronically signed by: Norman Gatlin MD 11/21/2024 12:43 AM EST RP Workstation: [id number]       LOS: 0 days   Joette Pebbles  [facility] Pager on [email or web address]  11/21/2024, 11:08 AM

## 2024-11-21 NOTE — Plan of Care (Signed)
  Problem: Education: Goal: Knowledge of General Education information will improve Description: Including pain rating scale, medication(s)/side effects and non-pharmacologic comfort measures 11/21/2024 0846 by Rosanne Elspeth HERO, RN Outcome: Progressing 11/21/2024 0846 by Rosanne Elspeth HERO, RN Outcome: Not Progressing   Problem: Health Behavior/Discharge Planning: Goal: Ability to manage health-related needs will improve 11/21/2024 0846 by Rosanne Elspeth HERO, RN Outcome: Progressing 11/21/2024 0846 by Rosanne Elspeth HERO, RN Outcome: Not Progressing   Problem: Clinical Measurements: Goal: Ability to maintain clinical measurements within normal limits will improve 11/21/2024 0846 by Rosanne Elspeth HERO, RN Outcome: Progressing 11/21/2024 0846 by Rosanne Elspeth HERO, RN Outcome: Not Progressing Goal: Will remain free from infection 11/21/2024 0846 by Rosanne Elspeth HERO, RN Outcome: Progressing 11/21/2024 0846 by Rosanne Elspeth HERO, RN Outcome: Not Progressing Goal: Diagnostic test results will improve 11/21/2024 0846 by Rosanne Elspeth HERO, RN Outcome: Progressing 11/21/2024 0846 by Rosanne Elspeth HERO, RN Outcome: Not Progressing Goal: Respiratory complications will improve 11/21/2024 0846 by Rosanne Elspeth HERO, RN Outcome: Progressing 11/21/2024 0846 by Rosanne Elspeth HERO, RN Outcome: Not Progressing Goal: Cardiovascular complication will be avoided 11/21/2024 0846 by Rosanne Elspeth HERO, RN Outcome: Progressing 11/21/2024 0846 by Rosanne Elspeth HERO, RN Outcome: Not Progressing   Problem: Activity: Goal: Risk for activity intolerance will decrease 11/21/2024 0846 by Rosanne Elspeth HERO, RN Outcome: Progressing 11/21/2024 0846 by Rosanne Elspeth HERO, RN Outcome: Not Progressing   Problem: Nutrition: Goal: Adequate nutrition will be maintained 11/21/2024 0846 by Rosanne Elspeth HERO, RN Outcome: Progressing 11/21/2024 0846 by Rosanne Elspeth HERO,  RN Outcome: Not Progressing   Problem: Coping: Goal: Level of anxiety will decrease 11/21/2024 0846 by Rosanne Elspeth HERO, RN Outcome: Progressing 11/21/2024 0846 by Rosanne Elspeth HERO, RN Outcome: Not Progressing   Problem: Elimination: Goal: Will not experience complications related to bowel motility 11/21/2024 0846 by Rosanne Elspeth HERO, RN Outcome: Progressing 11/21/2024 0846 by Rosanne Elspeth HERO, RN Outcome: Not Progressing Goal: Will not experience complications related to urinary retention 11/21/2024 0846 by Rosanne Elspeth HERO, RN Outcome: Progressing 11/21/2024 0846 by Rosanne Elspeth HERO, RN Outcome: Not Progressing   Problem: Pain Managment: Goal: General experience of comfort will improve and/or be controlled 11/21/2024 0846 by Rosanne Elspeth HERO, RN Outcome: Progressing 11/21/2024 0846 by Rosanne Elspeth HERO, RN Outcome: Not Progressing   Problem: Safety: Goal: Ability to remain free from injury will improve 11/21/2024 0846 by Rosanne Elspeth HERO, RN Outcome: Progressing 11/21/2024 0846 by Rosanne Elspeth HERO, RN Outcome: Not Progressing   Problem: Skin Integrity: Goal: Risk for impaired skin integrity will decrease 11/21/2024 0846 by Rosanne Elspeth HERO, RN Outcome: Progressing 11/21/2024 0846 by Rosanne Elspeth HERO, RN Outcome: Not Progressing

## 2024-11-21 NOTE — ED Notes (Signed)
 Dr. Roselyn aware that potassium level is 2.7

## 2024-11-21 NOTE — H&P (Signed)
 History and Physical    [name] FMW:[id number] DOB: 12-Sep-1951 DOA: 11/20/2024  PCP: [facility] [address] Patient coming from: Home  Chief Complaint: burning with urination, fevers, chills  HPI: Bryce Rodriguez is a 73 y.o. male with medical history significant of prostamegaly, hypertension hyperlipidemia and chronic low back pain who presents to the hospital with complaint of fevers chills burning with urination.  Patient states that he was home for about a week prior to this and that he felt like he had a nonproductive cough had some fevers had some chills and thought it was perhaps like pneumonia. States that he took over the counter medication and felt better. However within the next 3 days he was attempting to urinate and reports that he had significant difficulty starting his stream. He also reports that he again started to experience the fevers and chills. He took advil but states that thing did not appear to be improving and therefore he presented to the ER for further evaluation and treatment.  ED Course:  In the ER, BP 178/96, HR 99, RR 18, O2 saturation 98% on RA,  and Tmax 99.7. Cbc demonstrated wbc 19.5,  hb/hct 13.6/40.2, and platelet 232. Chemistry demonstrated Na 140, K 2.7, Cl 99, bicarb 28, Bun/Cr 16/1.07 and glucose 126.   CXR demonstrated no acute cardiopulmonary findings.  CT renal stone study demonstrated prostamegaly.  No acute abnormality in the abdomen/pelvis.  Analysis was positive for large leukocyte esterase and nitrate as well as greater than 50 white blood cells.  Urine and blood cultures have been sent off at the ER patient was started on Rocephin .  EKG demonstrated normal sinus rhythm possible RBBB V1   Review of Systems:  All systems reviewed and apart from history of presenting illness, are negative.  Past Medical History:  Diagnosis Date   Arthritis 02/24/2014   arms, hands   Class 1 obesity due to excess calories without serious comorbidity with body mass  index (BMI) of 32.0 to 32.9 in adult 06/12/2017   Environmental allergies 02/06/2017   HLD (hyperlipidemia)    HTN (hypertension)    Hypercholesteremia 01/23/2012   Mixed hyperlipidemia 06/12/2017   Numbness and tingling in hands 05/01/2014   04/17/2014. [facility].  Norleen Gavel, MD.  Radiographs: These do reveal obvious degenerative disc disease at C5-6. There is no instability noted. Plan: We will proceed with getting surgery set up at patient's earliest convenience.    Prediabetes 06/12/2017    Past Surgical History:  Procedure Laterality Date   COLONOSCOPY     NECK SURGERY  May 19 2014   none       reports that he has never smoked. His smokeless tobacco use includes snuff. He reports current alcohol use of about 12.0 standard drinks of alcohol per week. He reports that he does not use drugs.  Allergies  Allergen Reactions   Tomato Hives    Family History  Problem Relation Age of Onset   Stomach cancer Father    Cancer Other    Hypertension Other    Colon cancer Neg Hx    Esophageal cancer Neg Hx    Rectal cancer Neg Hx     Prior to Admission medications   Medication Sig Start Date End Date Taking? Authorizing Provider  amLODipine  (NORVASC ) 10 MG tablet TAKE 1 TABLET BY MOUTH EVERY DAY 01/14/20   Stallings, Zoe A, MD  aspirin  81 MG tablet Take 81 mg by mouth daily.  [provider]  COVID-19 mRNA Vac-TriS, Pfizer, (PFIZER-BIONT COVID-19 VAC-TRIS) SUSP injection Inject into the muscle. 04/15/21   Luiz Channel, MD  cyclobenzaprine  (FLEXERIL ) 5 MG tablet Take 1 tablet (5 mg total) by mouth 2 (two) times daily as needed for muscle spasms. 11/05/22   Tegeler, Lonni PARAS, MD  diclofenac  (VOLTAREN ) 75 MG EC tablet Take 1 tablet (75 mg total) by mouth 2 (two) times daily. 01/27/21   Tish Alm DEL, MD  gabapentin  (NEURONTIN ) 300 MG capsule TAKE 1 CAPSULE BY MOUTH AT BEDTIME. FOR TINGLING AND NUMBNESS 06/02/19   Stallings, Zoe A, MD   HYDROcodone -acetaminophen  (NORCO) 5-325 MG tablet Take 1 tablet by mouth every 6 (six) hours as needed. 09/17/20   Tish Alm DEL, MD  lidocaine  (LIDODERM ) 5 % Place 1 patch onto the skin daily. Remove & Discard patch within 12 hours or as directed by MD 11/05/22   Tegeler, Lonni PARAS, MD  losartan -hydrochlorothiazide (HYZAAR) 100-25 MG tablet TAKE 1 TABLET BY MOUTH EVERY DAY 01/14/20   Stallings, Zoe A, MD  methocarbamol (ROBAXIN) 500 MG tablet Take 1 tablet (500 mg total) by mouth 4 (four) times daily. 01/28/21   Tish Alm DEL, MD  potassium chloride  (KLOR-CON ) 10 MEQ tablet Take 1 tablet (10 mEq total) by mouth daily. 01/28/21   Tish Alm DEL, MD  rosuvastatin  (CRESTOR ) 20 MG tablet Take 1 tablet (20 mg total) by mouth daily. 06/01/20   Melonie Colonel, Mikel HERO, MD  tadalafil  (CIALIS ) 5 MG tablet TAKE 1 TABLET BY MOUTH DAILY AS NEEDED FOR ERECTILE DYSFUNCTION 12/04/19   Stallings, Zoe A, MD  testosterone  cypionate (DEPOTESTOSTERONE CYPIONATE) 200 MG/ML injection Inject 1 mL (200 mg total) into the muscle every 14 (fourteen) days. Patient not taking: Reported on 03/09/2020 11/21/18 11/21/19  Bettie Santana LABOR, MD    Physical Exam: Vitals:   11/21/24 0315 11/21/24 0330 11/21/24 0414 11/21/24 0415  BP: (!) 148/91 (!) 154/97  (!) 178/96  Pulse: 83 89  99  Resp: (!) 24 (!) 22  18  Temp:    99.7 F (37.6 C)  TempSrc:    Oral  SpO2: 95% 99%  98%  Weight:   84 kg   Height:   5' 6 (1.676 m)     Physical Exam Constitutional:      General: He is not in acute distress.    Appearance: Normal appearance.  HENT:     Head: Normocephalic and atraumatic.  Eyes:     Extraocular Movements: Extraocular movements intact.     Conjunctiva/sclera: Conjunctivae normal.     Pupils: Pupils are equal, round, and reactive to light.  Cardiovascular:     Rate and Rhythm: Normal rate and regular rhythm.     Pulses: Normal pulses.     Heart sounds: Normal heart sounds.  Pulmonary:     Effort: Pulmonary  effort is normal. No respiratory distress.     Breath sounds: Normal breath sounds. No wheezing, rhonchi or rales.  Abdominal:     General: Abdomen is flat. Bowel sounds are normal. There is no distension.     Palpations: Abdomen is soft.     Tenderness: There is no abdominal tenderness.  Musculoskeletal:        General: No deformity. Normal range of motion.  Skin:    General: Skin is warm and dry.     Coloration: Skin is not jaundiced.  Neurological:     General: No focal deficit present.     Mental Status: He is alert  and oriented to person, place, and time. Mental status is at baseline.   Labs on Admission: I have personally reviewed following labs and imaging studies  CBC: Recent Labs  Lab 11/20/24 2322  WBC 19.5*  NEUTROABS 16.7*  HGB 13.6  HCT 40.2  MCV 87.0  PLT 232   Basic Metabolic Panel: Recent Labs  Lab 11/20/24 2322  NA 140  K 2.7*  CL 99  CO2 28  GLUCOSE 126*  BUN 16  CREATININE 1.07  CALCIUM  9.9   GFR: Estimated Creatinine Clearance: 63.5 mL/min (by C-G formula based on SCr of 1.07 mg/dL). Liver Function Tests: Recent Labs  Lab 11/20/24 2322  AST 27  ALT 79*  ALKPHOS 56  BILITOT 0.5  PROT 7.0  ALBUMIN 4.4   No results for input(s): LIPASE, AMYLASE in the last 168 hours. No results for input(s): AMMONIA in the last 168 hours. Coagulation Profile: No results for input(s): INR, PROTIME in the last 168 hours. Cardiac Enzymes: No results for input(s): CKTOTAL, CKMB, CKMBINDEX, TROPONINI in the last 168 hours. BNP (last 3 results) No results for input(s): PROBNP in the last 8760 hours. HbA1C: No results for input(s): HGBA1C in the last 72 hours. CBG: No results for input(s): GLUCAP in the last 168 hours. Lipid Profile: No results for input(s): CHOL, HDL, LDLCALC, TRIG, CHOLHDL, LDLDIRECT in the last 72 hours. Thyroid  Function Tests: No results for input(s): TSH, T4TOTAL, FREET4, T3FREE,  THYROIDAB in the last 72 hours. Anemia Panel: No results for input(s): VITAMINB12, FOLATE, FERRITIN, TIBC, IRON, RETICCTPCT in the last 72 hours. Urine analysis:    Component Value Date/Time   COLORURINE YELLOW 11/20/2024 2322   APPEARANCEUR HAZY (A) 11/20/2024 2322   LABSPEC 1.018 11/20/2024 2322   PHURINE 6.5 11/20/2024 2322   GLUCOSEU NEGATIVE 11/20/2024 2322   HGBUR MODERATE (A) 11/20/2024 2322   BILIRUBINUR NEGATIVE 11/20/2024 2322   BILIRUBINUR negative 04/19/2020 0946   BILIRUBINUR Negative 12/08/2014 1050   KETONESUR NEGATIVE 11/20/2024 2322   PROTEINUR 30 (A) 11/20/2024 2322   UROBILINOGEN 0.2 04/19/2020 0946   UROBILINOGEN 1.0 12/29/2010 0120   NITRITE POSITIVE (A) 11/20/2024 2322   LEUKOCYTESUR LARGE (A) 11/20/2024 2322    Radiological Exams on Admission: CT Renal Stone Study Result Date: 11/21/2024 EXAM: CT UROGRAM 11/21/2024 12:32:05 AM TECHNIQUE: CT of the abdomen and pelvis was performed without the administration of intravenous contrast. Multiplanar reformatted images as well as MIP urogram images are provided for review. Automated exposure control, iterative reconstruction, and/or weight based adjustment of the mA/kV was utilized to reduce the radiation dose to as low as reasonably achievable. COMPARISON: 08/19/2019 CLINICAL HISTORY: Abdominal/flank pain, stone suspected. FINDINGS: LOWER CHEST: No acute abnormality. LIVER: The liver is unremarkable. GALLBLADDER AND BILE DUCTS: Gallbladder is unremarkable. No biliary ductal dilatation. SPLEEN: No acute abnormality. PANCREAS: No acute abnormality. ADRENAL GLANDS: No acute abnormality. KIDNEYS, URETERS AND BLADDER: No stones in the kidneys or ureters. No hydronephrosis. No perinephric or periureteral stranding. Nondistended bladder. GI AND BOWEL: Stomach demonstrates no acute abnormality. There is no bowel obstruction. Normal appendix. PERITONEUM AND RETROPERITONEUM: No ascites. No free air. VASCULATURE: Aortic  atherosclerotic calcification. LYMPH NODES: No lymphadenopathy. REPRODUCTIVE ORGANS: Enlarged prostate. BONES AND SOFT TISSUES: No acute osseous abnormality. No focal soft tissue abnormality. IMPRESSION: 1. No acute abnormality in the abdomen or pelvis. 2. Prostatomegaly. Electronically signed by: Norman Gatlin MD 11/21/2024 12:43 AM EST RP Workstation: [id number]    EKG: Independently reviewed.   Assessment/Plan Principal Problem:   SIRS (systemic inflammatory response  syndrome) (HCC) Active Problems:   Sepsis (HCC)   HTN (hypertension)   Hypercholesteremia   UTI (urinary tract infection)    Sepsis Source of infection: uti Blood culture,and urine culture are pending Imaging: prostatomegaly  IV antibiotics started Patient receiving fluids also  Hypertension Will continue norvasc  at this time  HLD Continue statin therapy   BPH Will start on flomax  at this time   Testosterone  deficiency  Will continue his hormone therapy    DVT prophylaxis: lovenox   Code Status: full  Family Communication: none  Disposition Plan: Status is: Observation The patient remains OBS appropriate and will d/c before 2 midnights.   Consults called: none Admission status: inpatient  Level of care: Level of care: Telemetry The medical decision making on this patient was of high complexity and the patient is at high risk for clinical deterioration, therefore this is a level 3 visit.  The medical decision making is of moderate complexity, therefore this is a level 2 visit.  Bradly MARLA Drones MD [facility]  If 7PM-7AM, please contact night-coverage [email or web address]  11/21/2024, 4:57 AM

## 2024-11-22 DIAGNOSIS — N3001 Acute cystitis with hematuria: Secondary | ICD-10-CM | POA: Diagnosis not present

## 2024-11-22 DIAGNOSIS — E876 Hypokalemia: Secondary | ICD-10-CM | POA: Diagnosis not present

## 2024-11-22 DIAGNOSIS — A419 Sepsis, unspecified organism: Secondary | ICD-10-CM | POA: Diagnosis not present

## 2024-11-22 LAB — CBC
HCT: 35.2 % — ABNORMAL LOW (ref 39.0–52.0)
Hemoglobin: 11.5 g/dL — ABNORMAL LOW (ref 13.0–17.0)
MCH: 29.6 pg (ref 26.0–34.0)
MCHC: 32.7 g/dL (ref 30.0–36.0)
MCV: 90.7 fL (ref 80.0–100.0)
Platelets: 180 K/uL (ref 150–400)
RBC: 3.88 MIL/uL — ABNORMAL LOW (ref 4.22–5.81)
RDW: 15.9 % — ABNORMAL HIGH (ref 11.5–15.5)
WBC: 18.7 K/uL — ABNORMAL HIGH (ref 4.0–10.5)
nRBC: 0 % (ref 0.0–0.2)

## 2024-11-22 LAB — BASIC METABOLIC PANEL WITH GFR
Anion gap: 9 (ref 5–15)
BUN: 11 mg/dL (ref 8–23)
CO2: 25 mmol/L (ref 22–32)
Calcium: 8.9 mg/dL (ref 8.9–10.3)
Chloride: 105 mmol/L (ref 98–111)
Creatinine, Ser: 0.8 mg/dL (ref 0.61–1.24)
GFR, Estimated: >60 mL/min (ref >60)
Glucose, Bld: 122 mg/dL — ABNORMAL HIGH (ref 70–99)
Potassium: 3.3 mmol/L — ABNORMAL LOW (ref 3.5–5.1)
Sodium: 139 mmol/L (ref 135–145)

## 2024-11-22 LAB — MAGNESIUM: Magnesium: 2.1 mg/dL (ref 1.7–2.4)

## 2024-11-22 MED ORDER — POLYETHYLENE GLYCOL 3350 17 G PO PACK
17.0000 g | PACK | Freq: Two times a day (BID) | ORAL | Status: DC
  Administered 2024-11-22 (×2): 17 g via ORAL
  Filled 2024-11-22 (×3): qty 1

## 2024-11-22 MED ORDER — POTASSIUM CHLORIDE CRYS ER 20 MEQ PO TBCR
40.0000 meq | EXTENDED_RELEASE_TABLET | Freq: Three times a day (TID) | ORAL | Status: AC
  Administered 2024-11-22 (×2): 40 meq via ORAL
  Filled 2024-11-22 (×2): qty 2

## 2024-11-22 MED ORDER — BISACODYL 10 MG RE SUPP: 10.0000 mg | Freq: Every day | RECTAL | Status: DC | PRN

## 2024-11-22 NOTE — Progress Notes (Signed)
 [facility] PROGRESS NOTE   Bryce Rodriguez FMW:[id number] DOB: November 08, 1951 DOA: 11/20/2024  PCP: [facility] [address]  Brief History: 73 y.o. male with medical history significant of prostamegaly, hypertension hyperlipidemia and chronic low back pain who presented to the hospital with complaint of fevers chills burning with urination.  Patient was found to have urinary tract infection.  Due to significant symptoms he was hospitalized for further management.    Consultants: None  Procedures: None   Subjective/Interval History: Patient mentioned that he feels a bit constipated.  Continues to have urinary frequency but feels better in terms of his discomfort from 2 days ago.  Denies any nausea vomiting.  Appetite remains poor though improving.    Assessment/Plan:  Urinary tract infection with sepsis, present on admission. Patient was noted to have leukocytosis tachycardia and abnormal UA at presentation. CT scan did not show any stones.  Prostatomegaly was seen. Urine culture with gram-negative rods.  Await final identification and sensitivities. Continue with ceftriaxone .  Blood cultures negative so far. He remains afebrile.  WBC noted to be better today at 18,700.  Constipation Will increase bowel regimen.  May need to use suppository.  Hypokalemia Continue to supplement.  Magnesium 2.1.    Essential hypertension Amlodipine  being continued.  Occasional high readings noted but stable for the most part.  Hyperlipidemia Continue statin.  Enlarged prostate Prostatomegaly noted on CT scan.  Flomax  has been initiated.  Outpatient urology consultation.  DVT Prophylaxis: Lovenox  Code Status: Full code Family Communication: Discussed with patient Disposition Plan: Hopefully return home when improved.  Mobilize.  Status is: Inpatient Remains inpatient appropriate because: Need for IV antibiotics      Medications: Scheduled:  amLODipine   10 mg Oral Daily   aspirin  EC   81 mg Oral Daily   enoxaparin  (LOVENOX ) injection  40 mg Subcutaneous QHS   polyethylene glycol  17 g Oral BID   potassium chloride   40 mEq Oral TID   rosuvastatin   20 mg Oral Daily   senna-docusate  2 tablet Oral BID   tamsulosin   0.4 mg Oral Daily   Continuous:  cefTRIAXone  (ROCEPHIN )  IV 2 g (11/22/24 0514)   PRN:acetaminophen  **OR** acetaminophen , bisacodyl , HYDROcodone -acetaminophen , morphine  injection, prochlorperazine   Antibiotics: Anti-infectives (From admission, onward)    Start     Dose/Rate Route Frequency Ordered Stop   11/21/24 0600  cefTRIAXone  (ROCEPHIN ) 2 g in sodium chloride  0.9 % 100 mL IVPB        2 g 200 mL/hr over 30 Minutes Intravenous Every 24 hours 11/21/24 0503     11/21/24 0130  cefTRIAXone  (ROCEPHIN ) 2 g in sodium chloride  0.9 % 100 mL IVPB        2 g 200 mL/hr over 30 Minutes Intravenous Once 11/21/24 0117 11/21/24 0221       Objective:  Vital Signs  Vitals:   11/21/24 1232 11/21/24 1606 11/21/24 1944 11/22/24 0500  BP: (!) 148/85 (!) 143/79 136/75 138/83  Pulse: 100 99 92 80  Resp: 20 20 18 16   Temp: 99.3 F (37.4 C) 99.7 F (37.6 C) 99.8 F (37.7 C) 98.6 F (37 C)  TempSrc: Oral Oral Oral Oral  SpO2: 96% 97% 97% 96%  Weight:      Height:        Intake/Output Summary (Last 24 hours) at 11/22/2024 1019 Last data filed at 11/22/2024 0500 Gross per 24 hour  Intake 899.92 ml  Output 100 ml  Net 799.92 ml   Filed Weights   11/20/24  2320 11/21/24 0414  Weight: 79.8 kg 84 kg    General appearance: Awake alert.  In no distress Resp: Clear to auscultation bilaterally.  Normal effort Cardio: S1-S2 is normal regular.  No S3-S4.  No rubs murmurs or bruit GI: Abdomen is soft.  Nontender nondistended.  Bowel sounds are present normal.  No masses organomegaly Extremities: No edema.  Full range of motion of lower extremities. Neurologic: Alert and oriented x3.  No focal neurological deficits.    Lab Results:  Data Reviewed: I have  personally reviewed following labs and reports of the imaging studies  CBC: Recent Labs  Lab 11/20/24 2322 11/21/24 1101 11/22/24 0445  WBC 19.5* 24.2* 18.7*  NEUTROABS 16.7* 20.7*  --   HGB 13.6 13.3 11.5*  HCT 40.2 41.1 35.2*  MCV 87.0 89.7 90.7  PLT 232 237 180    Basic Metabolic Panel: Recent Labs  Lab 11/20/24 2322 11/21/24 1101 11/22/24 0445  NA 140 136 139  K 2.7* 3.5 3.3*  CL 99 100 105  CO2 28 24 25   GLUCOSE 126* 131* 122*  BUN 16 11 11   CREATININE 1.07 0.90 0.80  CALCIUM  9.9 9.7 8.9  MG  --  2.0 2.1    GFR: Estimated Creatinine Clearance: 84.9 mL/min (by C-G formula based on SCr of 0.8 mg/dL).  Liver Function Tests: Recent Labs  Lab 11/20/24 2322  AST 27  ALT 79*  ALKPHOS 56  BILITOT 0.5  PROT 7.0  ALBUMIN 4.4     Radiology Studies: CT Renal Stone Study Result Date: 11/21/2024 EXAM: CT UROGRAM 11/21/2024 12:32:05 AM TECHNIQUE: CT of the abdomen and pelvis was performed without the administration of intravenous contrast. Multiplanar reformatted images as well as MIP urogram images are provided for review. Automated exposure control, iterative reconstruction, and/or weight based adjustment of the mA/kV was utilized to reduce the radiation dose to as low as reasonably achievable. COMPARISON: 08/19/2019 CLINICAL HISTORY: Abdominal/flank pain, stone suspected. FINDINGS: LOWER CHEST: No acute abnormality. LIVER: The liver is unremarkable. GALLBLADDER AND BILE DUCTS: Gallbladder is unremarkable. No biliary ductal dilatation. SPLEEN: No acute abnormality. PANCREAS: No acute abnormality. ADRENAL GLANDS: No acute abnormality. KIDNEYS, URETERS AND BLADDER: No stones in the kidneys or ureters. No hydronephrosis. No perinephric or periureteral stranding. Nondistended bladder. GI AND BOWEL: Stomach demonstrates no acute abnormality. There is no bowel obstruction. Normal appendix. PERITONEUM AND RETROPERITONEUM: No ascites. No free air. VASCULATURE: Aortic  atherosclerotic calcification. LYMPH NODES: No lymphadenopathy. REPRODUCTIVE ORGANS: Enlarged prostate. BONES AND SOFT TISSUES: No acute osseous abnormality. No focal soft tissue abnormality. IMPRESSION: 1. No acute abnormality in the abdomen or pelvis. 2. Prostatomegaly. Electronically signed by: Norman Gatlin MD 11/21/2024 12:43 AM EST RP Workstation: [id number]       LOS: 1 day   Joette Pebbles  [facility] Pager on [email or web address]  11/22/2024, 10:19 AM

## 2024-11-23 ENCOUNTER — Other Ambulatory Visit (HOSPITAL_COMMUNITY): Payer: Self-pay

## 2024-11-23 DIAGNOSIS — A419 Sepsis, unspecified organism: Secondary | ICD-10-CM | POA: Diagnosis not present

## 2024-11-23 DIAGNOSIS — E876 Hypokalemia: Secondary | ICD-10-CM | POA: Diagnosis not present

## 2024-11-23 DIAGNOSIS — R6510 Systemic inflammatory response syndrome (SIRS) of non-infectious origin without acute organ dysfunction: Secondary | ICD-10-CM | POA: Diagnosis not present

## 2024-11-23 LAB — BASIC METABOLIC PANEL WITH GFR
Anion gap: 10 (ref 5–15)
BUN: 12 mg/dL (ref 8–23)
CO2: 25 mmol/L (ref 22–32)
Calcium: 9 mg/dL (ref 8.9–10.3)
Chloride: 104 mmol/L (ref 98–111)
Creatinine, Ser: 0.74 mg/dL (ref 0.61–1.24)
GFR, Estimated: >60 mL/min (ref >60)
Glucose, Bld: 108 mg/dL — ABNORMAL HIGH (ref 70–99)
Potassium: 3.4 mmol/L — ABNORMAL LOW (ref 3.5–5.1)
Sodium: 140 mmol/L (ref 135–145)

## 2024-11-23 LAB — CBC
HCT: 36.7 % — ABNORMAL LOW (ref 39.0–52.0)
Hemoglobin: 11.9 g/dL — ABNORMAL LOW (ref 13.0–17.0)
MCH: 29.4 pg (ref 26.0–34.0)
MCHC: 32.4 g/dL (ref 30.0–36.0)
MCV: 90.6 fL (ref 80.0–100.0)
Platelets: 195 K/uL (ref 150–400)
RBC: 4.05 MIL/uL — ABNORMAL LOW (ref 4.22–5.81)
RDW: 15.6 % — ABNORMAL HIGH (ref 11.5–15.5)
WBC: 13.6 K/uL — ABNORMAL HIGH (ref 4.0–10.5)
nRBC: 0 % (ref 0.0–0.2)

## 2024-11-23 LAB — URINE CULTURE: Culture: >=100000 — AB

## 2024-11-23 LAB — MAGNESIUM: Magnesium: 2.3 mg/dL (ref 1.7–2.4)

## 2024-11-23 MED ORDER — POTASSIUM CHLORIDE CRYS ER 20 MEQ PO TBCR
40.0000 meq | EXTENDED_RELEASE_TABLET | Freq: Once | ORAL | Status: AC
  Administered 2024-11-23: 40 meq via ORAL
  Filled 2024-11-23: qty 2

## 2024-11-23 MED ORDER — CEFADROXIL 500 MG PO CAPS
1000.0000 mg | ORAL_CAPSULE | Freq: Two times a day (BID) | ORAL | 0 refills | Status: AC
  Filled 2024-11-23: qty 20, 5d supply, fill #0

## 2024-11-23 MED ORDER — TAMSULOSIN HCL 0.4 MG PO CAPS
0.4000 mg | ORAL_CAPSULE | Freq: Every day | ORAL | 0 refills | Status: AC
  Filled 2024-11-23: qty 30, 30d supply, fill #0

## 2024-11-23 NOTE — Discharge Summary (Signed)
 Physician Discharge Summary  Bryce Rodriguez FMW:[id number] DOB: 1951-02-26 DOA: 11/20/2024  PCP: [facility] [address]  Admit date: 11/20/2024 Discharge date: 11/23/2024  Admitted From: Home Disposition: Home  Recommendations for Outpatient Follow-up:  Follow up with PCP in 1 week with repeat CBC/BMP Recommend outpatient evaluation and follow-up by urology Follow up in ED if symptoms worsen or new appear   Home Health: No Equipment/Devices: None  Discharge Condition: Stable CODE STATUS: Full Diet recommendation: Heart healthy  Brief/Interim Summary: 73 y.o. male with medical history significant of prostamegaly, hypertension hyperlipidemia and chronic low back pain presented with fever, chills and dysuria.  He was admitted for UTI and started on IV fluids and antibiotics.  Subsequently, his condition has improved.  UA and culture is growing E. coli: Pansensitive.  He wants to go home today.  He is hemodynamically stable with improving WBCs.  He will be discharged today on 5 more days of oral cefadroxil .  Discharge Diagnoses:   Urinary tract infection with sepsis, present on admission. -Patient was noted to have leukocytosis, tachycardia and abnormal UA at presentation. -CT scan did not show any stones.  Prostatomegaly was seen. -Urine culture grew pansensitive E. coli.  Currently on ceftriaxone .  Blood cultures negative so far. -He remains afebrile and hemodynamically stable.  WBCs noted to be better today at 13.6 -He wants to go home today.  Sepsis has resolved -He will be discharged today on 5 more days of oral cefadroxil .   Constipation -Improved with treatment.  Continue as needed MiraLAX  and senna as an outpatient   Hypokalemia -Continue supplementation as an outpatient.  Leukocytosis - Improving   Essential hypertension -Continue home regimen.  Outpatient follow-up with PCP   Hyperlipidemia -Continue statin.   Enlarged prostate -Prostatomegaly noted on CT scan.   Flomax  has been initiated.  Outpatient urology consultation.   Discharge Instructions  Discharge Instructions     Diet - low sodium heart healthy   Complete by: As directed    Increase activity slowly   Complete by: As directed       Allergies as of 11/23/2024       Reactions   Tomato Hives        Medication List     STOP taking these medications    cyclobenzaprine  5 MG tablet Commonly known as: FLEXERIL    diclofenac  75 MG EC tablet Commonly known as: VOLTAREN    gabapentin  300 MG capsule Commonly known as: NEURONTIN    lidocaine  5 % Commonly known as: Lidoderm    methocarbamol 500 MG tablet Commonly known as: Robaxin   Pf[employer]-BioNT COVID-19 Vac-TriS Susp injection Generic drug: COVID-19 mRNA Vac-TriS Proofreader)   potassium chloride  10 MEQ tablet Commonly known as: KLOR-CON        TAKE these medications    Advil 200 MG Caps Generic drug: Ibuprofen Take 200-400 mg by mouth every 8 (eight) hours as needed (for pain- if not taking Tylenol ).   amLODipine  10 MG tablet Commonly known as: NORVASC  TAKE 1 TABLET BY MOUTH EVERY DAY   aspirin  81 MG tablet Take 81 mg by mouth daily.   cefadroxil  500 MG capsule Commonly known as: DURICEF Take 2 capsules (1,000 mg total) by mouth 2 (two) times daily for 5 days. Start taking on: November 24, 2024   Claritin 10 MG tablet Generic drug: loratadine Take 10 mg by mouth daily as needed for allergies or rhinitis.   HYDROcodone -acetaminophen  5-325 MG tablet Commonly known as: Norco Take 1 tablet by mouth every 6 (six) hours as  needed. What changed: reasons to take this   losartan -hydrochlorothiazide 100-25 MG tablet Commonly known as: HYZAAR TAKE 1 TABLET BY MOUTH EVERY DAY   Multivitamin Gummies Adult Chew Chew 2 Pieces by mouth daily.   potassium chloride  SA 20 MEQ tablet Commonly known as: KLOR-CON  M Take 40 mEq by mouth in the morning.   Restasis 0.05 % ophthalmic emulsion Generic drug:  cycloSPORINE Place 1 drop into both eyes in the morning.   rosuvastatin  40 MG tablet Commonly known as: CRESTOR  Take 40 mg by mouth at bedtime. What changed: Another medication with the same name was removed. Continue taking this medication, and follow the directions you see here.   tadalafil  5 MG tablet Commonly known as: CIALIS  TAKE 1 TABLET BY MOUTH DAILY AS NEEDED FOR ERECTILE DYSFUNCTION   tamsulosin  0.4 MG Caps capsule Commonly known as: FLOMAX  Take 1 capsule (0.4 mg total) by mouth daily.   testosterone  cypionate 200 MG/ML injection Commonly known as: DEPOTESTOSTERONE CYPIONATE Inject 1 mL (200 mg total) into the muscle every 14 (fourteen) days.   TYLENOL  500 MG tablet Generic drug: acetaminophen  Take 500-1,000 mg by mouth every 8 (eight) hours as needed (for pain- if not taking Advil).        Follow-up Information     [facility] [address] Schedule an appointment as soon as possible for a visit in 1 week(s).   Contact information: [address] [place] [address] [phone]         [facility]. Schedule an appointment as soon as possible for a visit in 1 week(s).   Contact information: [address] [place]  [address] [phone]               Allergies  Allergen Reactions   Tomato Hives    Consultations: None   Procedures/Studies: CT Renal Stone Study Result Date: 11/21/2024 EXAM: CT UROGRAM 11/21/2024 12:32:05 AM TECHNIQUE: CT of the abdomen and pelvis was performed without the administration of intravenous contrast. Multiplanar reformatted images as well as MIP urogram images are provided for review. Automated exposure control, iterative reconstruction, and/or weight based adjustment of the mA/kV was utilized to reduce the radiation dose to as low as reasonably achievable. COMPARISON: 08/19/2019 CLINICAL HISTORY: Abdominal/flank pain, stone suspected. FINDINGS: LOWER CHEST: No acute abnormality. LIVER:  The liver is unremarkable. GALLBLADDER AND BILE DUCTS: Gallbladder is unremarkable. No biliary ductal dilatation. SPLEEN: No acute abnormality. PANCREAS: No acute abnormality. ADRENAL GLANDS: No acute abnormality. KIDNEYS, URETERS AND BLADDER: No stones in the kidneys or ureters. No hydronephrosis. No perinephric or periureteral stranding. Nondistended bladder. GI AND BOWEL: Stomach demonstrates no acute abnormality. There is no bowel obstruction. Normal appendix. PERITONEUM AND RETROPERITONEUM: No ascites. No free air. VASCULATURE: Aortic atherosclerotic calcification. LYMPH NODES: No lymphadenopathy. REPRODUCTIVE ORGANS: Enlarged prostate. BONES AND SOFT TISSUES: No acute osseous abnormality. No focal soft tissue abnormality. IMPRESSION: 1. No acute abnormality in the abdomen or pelvis. 2. Prostatomegaly. Electronically signed by: Norman Gatlin MD 11/21/2024 12:43 AM EST RP Workstation: [id number]      Subjective: Patient seen and examined at bedside.  Feels better and wants to go home today.  No fever, vomiting, abdominal pain reported.  Discharge Exam: Vitals:   11/22/24 2051 11/23/24 0453  BP: (!) 145/85 (!) 153/90  Pulse: 85 84  Resp: 20   Temp: 98.2 F (36.8 C) 98.7 F (37.1 C)  SpO2: 94% 98%    General: Pt is alert, awake, not in acute distress.  On room  air. Cardiovascular: rate controlled, S1/S2 + Respiratory: bilateral decreased breath sounds at bases Abdominal: Soft, NT, ND, bowel sounds + Extremities: no edema, no cyanosis    The results of significant diagnostics from this hospitalization (including imaging, microbiology, ancillary and laboratory) are listed below for reference.     Microbiology: Recent Results (from the past 240 hours)  Urine Culture     Status: Abnormal   Collection Time: 11/20/24 11:22 PM   Specimen: Urine, Clean Catch  Result Value Ref Range Status   Specimen Description   Final    URINE, CLEAN CATCH Performed at [facility], [address] [place], [place] [address]    Special Requests   Final    NONE Performed at [facility], [address], [place], [place] [address]    Culture >=100,000 COLONIES/mL ESCHERICHIA COLI (A)  Final   Report Status 11/23/2024 FINAL  Final   Organism ID, Bacteria ESCHERICHIA COLI (A)  Final      Susceptibility   Escherichia coli - MIC*    AMPICILLIN 4 SENSITIVE Sensitive     CEFAZOLIN (URINE) Value in next row Sensitive      2 SENSITIVEThis is a modified FDA-approved test that has been validated and its performance characteristics determined by the reporting laboratory.  This laboratory is certified under the Clinical Laboratory Improvement Amendments CLIA as qualified to perform high complexity clinical laboratory testing.    CEFEPIME Value in next row Sensitive      2 SENSITIVEThis is a modified FDA-approved test that has been validated and its performance characteristics determined by the reporting laboratory.  This laboratory is certified under the Clinical Laboratory Improvement Amendments CLIA as qualified to perform high complexity clinical laboratory testing.    ERTAPENEM Value in next row Sensitive      2 SENSITIVEThis is a modified FDA-approved test that has been validated and its performance characteristics determined by the reporting laboratory.  This laboratory is certified under the Clinical Laboratory Improvement Amendments CLIA as qualified to perform high complexity clinical laboratory testing.    CEFTRIAXONE  Value in next row Sensitive      2 SENSITIVEThis is a modified FDA-approved test that has been validated and its performance characteristics determined by the reporting laboratory.  This laboratory is certified under the Clinical Laboratory Improvement Amendments CLIA as qualified to perform high complexity clinical laboratory testing.    CIPROFLOXACIN  Value in next row Sensitive      2 SENSITIVEThis is a modified FDA-approved  test that has been validated and its performance characteristics determined by the reporting laboratory.  This laboratory is certified under the Clinical Laboratory Improvement Amendments CLIA as qualified to perform high complexity clinical laboratory testing.    GENTAMICIN Value in next row Sensitive      2 SENSITIVEThis is a modified FDA-approved test that has been validated and its performance characteristics determined by the reporting laboratory.  This laboratory is certified under the Clinical Laboratory Improvement Amendments CLIA as qualified to perform high complexity clinical laboratory testing.    NITROFURANTOIN Value in next row Sensitive      2 SENSITIVEThis is a modified FDA-approved test that has been validated and its performance characteristics determined by the reporting laboratory.  This laboratory is certified under the Clinical Laboratory Improvement Amendments CLIA as qualified to perform high complexity clinical laboratory testing.    TRIMETH/SULFA Value in next row Sensitive      2 SENSITIVEThis is a modified FDA-approved test that has been validated and its  performance characteristics determined by the reporting laboratory.  This laboratory is certified under the Clinical Laboratory Improvement Amendments CLIA as qualified to perform high complexity clinical laboratory testing.    AMPICILLIN/SULBACTAM Value in next row Sensitive      2 SENSITIVEThis is a modified FDA-approved test that has been validated and its performance characteristics determined by the reporting laboratory.  This laboratory is certified under the Clinical Laboratory Improvement Amendments CLIA as qualified to perform high complexity clinical laboratory testing.    PIP/TAZO Value in next row Sensitive      <=4 SENSITIVEThis is a modified FDA-approved test that has been validated and its performance characteristics determined by the reporting laboratory.  This laboratory is certified under the Clinical  Laboratory Improvement Amendments CLIA as qualified to perform high complexity clinical laboratory testing.    MEROPENEM Value in next row Sensitive      <=4 SENSITIVEThis is a modified FDA-approved test that has been validated and its performance characteristics determined by the reporting laboratory.  This laboratory is certified under the Clinical Laboratory Improvement Amendments CLIA as qualified to perform high complexity clinical laboratory testing.    * >=100,000 COLONIES/mL ESCHERICHIA COLI  Culture, blood (routine x 2)     Status: None (Preliminary result)   Collection Time: 11/21/24  1:27 AM   Specimen: BLOOD  Result Value Ref Range Status   Specimen Description   Final    BLOOD RIGHT ANTECUBITAL Performed at [facility], [address] [place], [place] [address]    Special Requests   Final    BOTTLES DRAWN AEROBIC AND ANAEROBIC Blood Culture adequate volume Performed at [facility], [address], [place], [place] [address]    Culture   Final    NO GROWTH < 24 HOURS Performed at [facility], [address] [place], [place] [address]    Report Status PENDING  Incomplete  Culture, blood (routine x 2)     Status: None (Preliminary result)   Collection Time: 11/21/24  1:27 AM   Specimen: BLOOD RIGHT HAND  Result Value Ref Range Status   Specimen Description   Final    BLOOD RIGHT HAND Performed at [facility], [address] [place], [place] [address]    Special Requests   Final    BOTTLES DRAWN AEROBIC AND ANAEROBIC Blood Culture adequate volume Performed at [facility], [address] [place], [place] [address]    Culture   Final    NO GROWTH < 24 HOURS Performed at [facility], [address], [place], [place] [address]    Report Status PENDING  Incomplete     Labs: BNP (last 3 results) No results for input(s): BNP in the last 8760 hours. Basic Metabolic  Panel: Recent Labs  Lab 11/20/24 2322 11/21/24 1101 11/22/24 0445 11/23/24 0423  NA 140 136 139 140  K 2.7* 3.5 3.3* 3.4*  CL 99 100 105 104  CO2 28 24 25 25   GLUCOSE 126* 131* 122* 108*  BUN 16 11 11 12   CREATININE 1.07 0.90 0.80 0.74  CALCIUM  9.9 9.7 8.9 9.0  MG  --  2.0 2.1 2.3   Liver Function Tests: Recent Labs  Lab 11/20/24 2322  AST 27  ALT 79*  ALKPHOS 56  BILITOT 0.5  PROT 7.0  ALBUMIN 4.4   No results for input(s): LIPASE, AMYLASE in the last 168 hours. No results for input(s): AMMONIA in the last 168 hours. CBC: Recent Labs  Lab 11/20/24 2322 11/21/24 1101 11/22/24 0445 11/23/24 0423  WBC 19.5* 24.2* 18.7* 13.6*  NEUTROABS 16.7* 20.7*  --   --   HGB 13.6 13.3 11.5* 11.9*  HCT 40.2 41.1 35.2* 36.7*  MCV 87.0 89.7 90.7 90.6  PLT 232 237 180 195   Cardiac Enzymes: No results for input(s): CKTOTAL, CKMB, CKMBINDEX, TROPONINI in the last 168 hours. BNP: Invalid input(s): POCBNP CBG: No results for input(s): GLUCAP in the last 168 hours. D-Dimer No results for input(s): DDIMER in the last 72 hours. Hgb A1c No results for input(s): HGBA1C in the last 72 hours. Lipid Profile No results for input(s): CHOL, HDL, LDLCALC, TRIG, CHOLHDL, LDLDIRECT in the last 72 hours. Thyroid  function studies No results for input(s): TSH, T4TOTAL, T3FREE, THYROIDAB in the last 72 hours.  Invalid input(s): FREET3 Anemia work up No results for input(s): VITAMINB12, FOLATE, FERRITIN, TIBC, IRON, RETICCTPCT in the last 72 hours. Urinalysis    Component Value Date/Time   COLORURINE YELLOW 11/20/2024 2322   APPEARANCEUR HAZY (A) 11/20/2024 2322   LABSPEC 1.018 11/20/2024 2322   PHURINE 6.5 11/20/2024 2322   GLUCOSEU NEGATIVE 11/20/2024 2322   HGBUR MODERATE (A) 11/20/2024 2322   BILIRUBINUR NEGATIVE 11/20/2024 2322   BILIRUBINUR negative 04/19/2020 0946   BILIRUBINUR Negative 12/08/2014 1050   KETONESUR  NEGATIVE 11/20/2024 2322   PROTEINUR 30 (A) 11/20/2024 2322   UROBILINOGEN 0.2 04/19/2020 0946   UROBILINOGEN 1.0 12/29/2010 0120   NITRITE POSITIVE (A) 11/20/2024 2322   LEUKOCYTESUR LARGE (A) 11/20/2024 2322   Sepsis Labs Recent Labs  Lab 11/20/24 2322 11/21/24 1101 11/22/24 0445 11/23/24 0423  WBC 19.5* 24.2* 18.7* 13.6*   Microbiology Recent Results (from the past 240 hours)  Urine Culture     Status: Abnormal   Collection Time: 11/20/24 11:22 PM   Specimen: Urine, Clean Catch  Result Value Ref Range Status   Specimen Description   Final    URINE, CLEAN CATCH Performed at [facility], [address], [place], [place] [address]    Special Requests   Final    NONE Performed at [facility], [address], [place], [place] [address]    Culture >=100,000 COLONIES/mL ESCHERICHIA COLI (A)  Final   Report Status 11/23/2024 FINAL  Final   Organism ID, Bacteria ESCHERICHIA COLI (A)  Final      Susceptibility   Escherichia coli - MIC*    AMPICILLIN 4 SENSITIVE Sensitive     CEFAZOLIN (URINE) Value in next row Sensitive      2 SENSITIVEThis is a modified FDA-approved test that has been validated and its performance characteristics determined by the reporting laboratory.  This laboratory is certified under the Clinical Laboratory Improvement Amendments CLIA as qualified to perform high complexity clinical laboratory testing.    CEFEPIME Value in next row Sensitive      2 SENSITIVEThis is a modified FDA-approved test that has been validated and its performance characteristics determined by the reporting laboratory.  This laboratory is certified under the Clinical Laboratory Improvement Amendments CLIA as qualified to perform high complexity clinical laboratory testing.    ERTAPENEM Value in next row Sensitive      2 SENSITIVEThis is a modified FDA-approved test that has been validated and its performance characteristics determined by the  reporting laboratory.  This laboratory is certified under the Clinical Laboratory Improvement Amendments CLIA as qualified to perform high complexity clinical laboratory testing.    CEFTRIAXONE  Value in next row Sensitive  2 SENSITIVEThis is a modified FDA-approved test that has been validated and its performance characteristics determined by the reporting laboratory.  This laboratory is certified under the Clinical Laboratory Improvement Amendments CLIA as qualified to perform high complexity clinical laboratory testing.    CIPROFLOXACIN  Value in next row Sensitive      2 SENSITIVEThis is a modified FDA-approved test that has been validated and its performance characteristics determined by the reporting laboratory.  This laboratory is certified under the Clinical Laboratory Improvement Amendments CLIA as qualified to perform high complexity clinical laboratory testing.    GENTAMICIN Value in next row Sensitive      2 SENSITIVEThis is a modified FDA-approved test that has been validated and its performance characteristics determined by the reporting laboratory.  This laboratory is certified under the Clinical Laboratory Improvement Amendments CLIA as qualified to perform high complexity clinical laboratory testing.    NITROFURANTOIN Value in next row Sensitive      2 SENSITIVEThis is a modified FDA-approved test that has been validated and its performance characteristics determined by the reporting laboratory.  This laboratory is certified under the Clinical Laboratory Improvement Amendments CLIA as qualified to perform high complexity clinical laboratory testing.    TRIMETH/SULFA Value in next row Sensitive      2 SENSITIVEThis is a modified FDA-approved test that has been validated and its performance characteristics determined by the reporting laboratory.  This laboratory is certified under the Clinical Laboratory Improvement Amendments CLIA as qualified to perform high complexity clinical  laboratory testing.    AMPICILLIN/SULBACTAM Value in next row Sensitive      2 SENSITIVEThis is a modified FDA-approved test that has been validated and its performance characteristics determined by the reporting laboratory.  This laboratory is certified under the Clinical Laboratory Improvement Amendments CLIA as qualified to perform high complexity clinical laboratory testing.    PIP/TAZO Value in next row Sensitive      <=4 SENSITIVEThis is a modified FDA-approved test that has been validated and its performance characteristics determined by the reporting laboratory.  This laboratory is certified under the Clinical Laboratory Improvement Amendments CLIA as qualified to perform high complexity clinical laboratory testing.    MEROPENEM Value in next row Sensitive      <=4 SENSITIVEThis is a modified FDA-approved test that has been validated and its performance characteristics determined by the reporting laboratory.  This laboratory is certified under the Clinical Laboratory Improvement Amendments CLIA as qualified to perform high complexity clinical laboratory testing.    * >=100,000 COLONIES/mL ESCHERICHIA COLI  Culture, blood (routine x 2)     Status: None (Preliminary result)   Collection Time: 11/21/24  1:27 AM   Specimen: BLOOD  Result Value Ref Range Status   Specimen Description   Final    BLOOD RIGHT ANTECUBITAL Performed at [facility], [address] [place], [place] [address]    Special Requests   Final    BOTTLES DRAWN AEROBIC AND ANAEROBIC Blood Culture adequate volume Performed at [facility], [address], [place], [place] [address]    Culture   Final    NO GROWTH < 24 HOURS Performed at [facility], [address] [place], [place] [address]    Report Status PENDING  Incomplete  Culture, blood (routine x 2)     Status: None (Preliminary result)   Collection Time: 11/21/24  1:27 AM   Specimen: BLOOD RIGHT HAND  Result  Value Ref Range Status   Specimen Description  Final    BLOOD RIGHT HAND Performed at [facility], [address] [place], [place] [address]    Special Requests   Final    BOTTLES DRAWN AEROBIC AND ANAEROBIC Blood Culture adequate volume Performed at [facility], [address] [place], [place] [address]    Culture   Final    NO GROWTH < 24 HOURS Performed at [facility], [address], [place], [place] [address]    Report Status PENDING  Incomplete     Time coordinating discharge: 35 minutes  SIGNED:   Sophie Mao, MD  [facility] 11/23/2024, 9:32 AM

## 2024-11-26 LAB — CULTURE, BLOOD (ROUTINE X 2)
Culture: NO GROWTH
Culture: NO GROWTH
Special Requests: ADEQUATE
Special Requests: ADEQUATE
